# Patient Record
Sex: Female | Born: 1990 | Race: Black or African American | Hispanic: No | Marital: Single | Smoking: Current some day smoker
Health system: Southern US, Community
[De-identification: ages and names within clinical notes are randomized; demographics above are authoritative.]

## PROBLEM LIST (undated history)

## (undated) ENCOUNTER — Inpatient Hospital Stay (HOSPITAL_COMMUNITY): Payer: Self-pay

## (undated) DIAGNOSIS — I499 Cardiac arrhythmia, unspecified: Secondary | ICD-10-CM

## (undated) DIAGNOSIS — D573 Sickle-cell trait: Secondary | ICD-10-CM

## (undated) DIAGNOSIS — F32A Depression, unspecified: Secondary | ICD-10-CM

## (undated) DIAGNOSIS — F419 Anxiety disorder, unspecified: Secondary | ICD-10-CM

## (undated) DIAGNOSIS — F329 Major depressive disorder, single episode, unspecified: Secondary | ICD-10-CM

## (undated) HISTORY — PX: NO PAST SURGERIES: SHX2092

---
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
## 2009-02-05 ENCOUNTER — Emergency Department (HOSPITAL_COMMUNITY): Admission: EM | Admit: 2009-02-05 | Discharge: 2009-02-05 | Payer: Self-pay | Admitting: Emergency Medicine

## 2009-03-05 ENCOUNTER — Emergency Department (HOSPITAL_COMMUNITY): Admission: EM | Admit: 2009-03-05 | Discharge: 2009-03-05 | Payer: Self-pay | Admitting: Family Medicine

## 2009-05-20 ENCOUNTER — Emergency Department (HOSPITAL_COMMUNITY): Admission: EM | Admit: 2009-05-20 | Discharge: 2009-05-20 | Payer: Self-pay | Admitting: Emergency Medicine

## 2009-05-24 ENCOUNTER — Emergency Department (HOSPITAL_COMMUNITY): Admission: EM | Admit: 2009-05-24 | Discharge: 2009-05-24 | Payer: Self-pay | Admitting: Emergency Medicine

## 2010-06-27 ENCOUNTER — Emergency Department (HOSPITAL_COMMUNITY)
Admission: EM | Admit: 2010-06-27 | Discharge: 2010-06-27 | Payer: Self-pay | Source: Home / Self Care | Admitting: Family Medicine

## 2010-08-08 ENCOUNTER — Emergency Department (HOSPITAL_COMMUNITY)
Admission: EM | Admit: 2010-08-08 | Discharge: 2010-08-08 | Payer: Self-pay | Source: Home / Self Care | Admitting: Emergency Medicine

## 2010-09-26 LAB — POCT RAPID STREP A (OFFICE): Streptococcus, Group A Screen (Direct): NEGATIVE

## 2010-09-26 LAB — CBC
HCT: 37.6 % (ref 36.0–46.0)
Hemoglobin: 12.9 g/dL (ref 12.0–15.0)
MCH: 30.9 pg (ref 26.0–34.0)
MCHC: 34.3 g/dL (ref 30.0–36.0)
MCV: 90 fL (ref 78.0–100.0)
Platelets: 176 10*3/uL (ref 150–400)
RBC: 4.18 MIL/uL (ref 3.87–5.11)
RDW: 12.8 % (ref 11.5–15.5)
WBC: 11.4 10*3/uL — ABNORMAL HIGH (ref 4.0–10.5)

## 2010-09-26 LAB — DIFFERENTIAL
Lymphocytes Relative: 16 % (ref 12–46)
Lymphs Abs: 1.8 10*3/uL (ref 0.7–4.0)
Neutrophils Relative %: 75 % (ref 43–77)

## 2010-09-26 LAB — POCT INFECTIOUS MONO SCREEN: Mono Screen: NEGATIVE

## 2010-10-19 LAB — DIFFERENTIAL
Basophils Relative: 0 % (ref 0–1)
Eosinophils Absolute: 0 10*3/uL (ref 0.0–0.7)
Eosinophils Absolute: 0 10*3/uL (ref 0.0–0.7)
Lymphocytes Relative: 18 % (ref 12–46)
Lymphs Abs: 1.1 10*3/uL (ref 0.7–4.0)
Monocytes Relative: 11 % (ref 3–12)
Neutro Abs: 4.6 10*3/uL (ref 1.7–7.7)
Neutrophils Relative %: 70 % (ref 43–77)
Neutrophils Relative %: 70 % (ref 43–77)

## 2010-10-19 LAB — URINALYSIS, ROUTINE W REFLEX MICROSCOPIC
Glucose, UA: NEGATIVE mg/dL
Ketones, ur: 15 mg/dL — AB
Nitrite: POSITIVE — AB
Protein, ur: NEGATIVE mg/dL
Specific Gravity, Urine: 1.019 (ref 1.005–1.030)
Urobilinogen, UA: 1 mg/dL (ref 0.0–1.0)
pH: 6 (ref 5.0–8.0)

## 2010-10-19 LAB — POCT I-STAT, CHEM 8
Calcium, Ion: 1.2 mmol/L (ref 1.12–1.32)
Hemoglobin: 15 g/dL (ref 12.0–15.0)
Sodium: 138 mEq/L (ref 135–145)
TCO2: 26 mmol/L (ref 0–100)

## 2010-10-19 LAB — CBC
MCHC: 34.4 g/dL (ref 30.0–36.0)
MCV: 96.2 fL (ref 78.0–100.0)
MCV: 96.2 fL (ref 78.0–100.0)
Platelets: 172 10*3/uL (ref 150–400)
RBC: 4.37 MIL/uL (ref 3.87–5.11)
WBC: 6.5 10*3/uL (ref 4.0–10.5)

## 2010-10-19 LAB — PREGNANCY, URINE: Preg Test, Ur: NEGATIVE

## 2010-10-19 LAB — POCT PREGNANCY, URINE: Preg Test, Ur: NEGATIVE

## 2010-10-19 LAB — URINE MICROSCOPIC-ADD ON

## 2010-10-19 LAB — GC/CHLAMYDIA PROBE AMP, GENITAL
Chlamydia, DNA Probe: NEGATIVE
GC Probe Amp, Genital: NEGATIVE

## 2010-10-19 LAB — WET PREP, GENITAL
Trich, Wet Prep: NONE SEEN
Yeast Wet Prep HPF POC: NONE SEEN

## 2010-10-19 LAB — MONONUCLEOSIS SCREEN: Mono Screen: POSITIVE — AB

## 2010-10-22 LAB — POCT URINALYSIS DIP (DEVICE)
Bilirubin Urine: NEGATIVE
Glucose, UA: NEGATIVE mg/dL
Nitrite: POSITIVE — AB
Urobilinogen, UA: 0.2 mg/dL (ref 0.0–1.0)

## 2010-10-22 LAB — GC/CHLAMYDIA PROBE AMP, GENITAL: Chlamydia, DNA Probe: POSITIVE — AB

## 2010-10-22 LAB — WET PREP, GENITAL: WBC, Wet Prep HPF POC: NONE SEEN

## 2010-10-22 LAB — POCT PREGNANCY, URINE: Preg Test, Ur: NEGATIVE

## 2011-03-30 ENCOUNTER — Emergency Department (HOSPITAL_COMMUNITY)
Admission: EM | Admit: 2011-03-30 | Discharge: 2011-03-30 | Disposition: A | Discharge status: Home / Self Care | Payer: Self-pay | Attending: Emergency Medicine | Admitting: Emergency Medicine

## 2011-03-30 ENCOUNTER — Emergency Department (HOSPITAL_COMMUNITY): Payer: Self-pay

## 2011-03-30 DIAGNOSIS — R10812 Left upper quadrant abdominal tenderness: Secondary | ICD-10-CM | POA: Insufficient documentation

## 2011-03-30 DIAGNOSIS — R197 Diarrhea, unspecified: Secondary | ICD-10-CM | POA: Insufficient documentation

## 2011-03-30 DIAGNOSIS — R1032 Left lower quadrant pain: Secondary | ICD-10-CM | POA: Insufficient documentation

## 2011-03-30 DIAGNOSIS — D573 Sickle-cell trait: Secondary | ICD-10-CM | POA: Insufficient documentation

## 2011-03-30 DIAGNOSIS — R110 Nausea: Secondary | ICD-10-CM | POA: Insufficient documentation

## 2011-03-30 LAB — COMPREHENSIVE METABOLIC PANEL
AST: 17 U/L (ref 0–37)
Albumin: 4.4 g/dL (ref 3.5–5.2)
Alkaline Phosphatase: 58 U/L (ref 39–117)
BUN: 10 mg/dL (ref 6–23)
CO2: 24 mEq/L (ref 19–32)
Chloride: 101 mEq/L (ref 96–112)
Creatinine, Ser: 0.59 mg/dL (ref 0.50–1.10)
GFR calc non Af Amer: >60 mL/min (ref >60)
Potassium: 3.6 mEq/L (ref 3.5–5.1)
Total Bilirubin: 0.9 mg/dL (ref 0.3–1.2)

## 2011-03-30 LAB — CBC
HCT: 40.3 % (ref 36.0–46.0)
Hemoglobin: 14.4 g/dL (ref 12.0–15.0)
MCH: 31.9 pg (ref 26.0–34.0)
MCHC: 35.7 g/dL (ref 30.0–36.0)
MCV: 89.4 fL (ref 78.0–100.0)
RDW: 12.9 % (ref 11.5–15.5)

## 2011-03-30 LAB — DIFFERENTIAL
Eosinophils Relative: 2 % (ref 0–5)
Lymphs Abs: 2 10*3/uL (ref 0.7–4.0)
Monocytes Absolute: 0.6 10*3/uL (ref 0.1–1.0)
Monocytes Relative: 9 % (ref 3–12)
Neutro Abs: 4 10*3/uL (ref 1.7–7.7)

## 2011-03-30 LAB — URINALYSIS, ROUTINE W REFLEX MICROSCOPIC
Bilirubin Urine: NEGATIVE
Glucose, UA: NEGATIVE mg/dL
Hgb urine dipstick: NEGATIVE
Ketones, ur: NEGATIVE mg/dL
Protein, ur: NEGATIVE mg/dL
pH: 6 (ref 5.0–8.0)

## 2011-03-30 MED ORDER — IOHEXOL 300 MG/ML  SOLN
100.0000 mL | Freq: Once | INTRAMUSCULAR | Status: AC | PRN
  Administered 2011-03-30: 100 mL via INTRAVENOUS

## 2011-07-18 NOTE — L&D Delivery Note (Signed)
Delivery Note At 5:27 PM a viable female was delivered via Vaginal, Spontaneous Delivery (Presentation: Right Occiput Anterior).  APGAR: 5, 8; weight 6 lb 10 oz (3005 g).   Placenta status: Intact, Spontaneous.  Cord: 3 vessels with the following complications: None.  Cord pH: none  Anesthesia: Epidural  Episiotomy: None Lacerations: 2nd degree Suture Repair: 2.0 chromic Est. Blood Loss (mL): 350  Mom to postpartum.  Baby to nursery-stable.  Mackenzey Crownover A 06/01/2012, 6:22 PM

## 2011-11-09 ENCOUNTER — Encounter (HOSPITAL_COMMUNITY): Payer: Self-pay | Admitting: *Deleted

## 2011-11-09 ENCOUNTER — Inpatient Hospital Stay (HOSPITAL_COMMUNITY)
Admission: AD | Admit: 2011-11-09 | Discharge: 2011-11-09 | Disposition: A | Discharge status: Home / Self Care | Payer: Self-pay | Source: Ambulatory Visit | Attending: Obstetrics & Gynecology | Admitting: Obstetrics & Gynecology

## 2011-11-09 ENCOUNTER — Inpatient Hospital Stay (HOSPITAL_COMMUNITY): Payer: Self-pay

## 2011-11-09 DIAGNOSIS — Z3490 Encounter for supervision of normal pregnancy, unspecified, unspecified trimester: Secondary | ICD-10-CM

## 2011-11-09 DIAGNOSIS — R109 Unspecified abdominal pain: Secondary | ICD-10-CM | POA: Insufficient documentation

## 2011-11-09 DIAGNOSIS — Z1389 Encounter for screening for other disorder: Secondary | ICD-10-CM

## 2011-11-09 DIAGNOSIS — O99891 Other specified diseases and conditions complicating pregnancy: Secondary | ICD-10-CM | POA: Insufficient documentation

## 2011-11-09 HISTORY — DX: Sickle-cell trait: D57.3

## 2011-11-09 LAB — WET PREP, GENITAL
Clue Cells Wet Prep HPF POC: NONE SEEN
Trich, Wet Prep: NONE SEEN
Yeast Wet Prep HPF POC: NONE SEEN

## 2011-11-09 LAB — URINALYSIS, ROUTINE W REFLEX MICROSCOPIC
Bilirubin Urine: NEGATIVE
Glucose, UA: NEGATIVE mg/dL
Hgb urine dipstick: NEGATIVE
Ketones, ur: 15 mg/dL — AB
Leukocytes, UA: NEGATIVE
Protein, ur: NEGATIVE mg/dL
pH: 6 (ref 5.0–8.0)

## 2011-11-09 NOTE — MAU Provider Note (Signed)
Attestation of Attending Supervision of Advanced Practitioner: Evaluation and management procedures were performed by the [facility] Fellow/PA/CNM/NP under my supervision and collaboration. Chart reviewed, and agree with management and plan.  Jaynie Collins, M.D. 11/09/2011 3:25 PM

## 2011-11-09 NOTE — Discharge Instructions (Signed)
    ________________________________________     To schedule your Maternity Eligibility Appointment, please call [phone].  When you arrive for your appointment you must bring the following items or information listed below.  Your appointment will be rescheduled if you do not have these items or are 15 minutes late. If currently receiving Medicaid, you MUST bring: 1. Medicaid Card 2. Social Security Card 3. Picture ID 4. Proof of Pregnancy 5. Verification of current address if the address on Medicaid card is incorrect "postmarked mail" If not receiving Medicaid, you MUST bring: 1. Social Security Card 2. Picture ID 3. Birth Certificate (if available) Passport or *Green Card 4. Proof of Pregnancy 5. Verification of current address "postmarked mail" for each income presented. 6. Verification of insurance coverage, if any 7. Check stubs from each employer for the previous month (if unable to present check stub  for each week, we will accept check stub for the first and last week ill the same month.) If you can't locate check stubs, you must bring a letter from the employer(s) and it must have the following information on letterhead, typed, in English: o name of company o company telephone number o how long been with the company, if less than one month o how much person earns per hour o how many hours per week work o the gross pay the person earned for the previous month If you are 23 years old or less, you do not have to bring proof of income unless you work or live with the father of the baby and at that time we will need proof of income from you and/or the father of the baby. Green Card recipients are eligible for Medicaid for Pregnant Women (MPW)    

## 2011-11-09 NOTE — MAU Provider Note (Signed)
History     CSN: [id number]  Arrival date & time 11/09/11  1122   None     Chief Complaint  Patient presents with  . Abdominal Pain    HPI Jill Reed is a 21 y.o. female @ [redacted]w[redacted]d gestation who presents to MAU for pain in early pregnancy. She describes the pain as cramping. The pain comes and goes. She has some nausea.  She denies bleeding or other problems. She had her last pap smear a year ago and was normal. No history of STD's. The history was provided by the patient.  Past Medical History  Diagnosis Date  . Asthma   . Sickle cell trait     Past Surgical History  Procedure Date  . No past surgeries     History reviewed. No pertinent family history.  History  Substance Use Topics  . Smoking status: Current Some Day Smoker    Types: Cigarettes  . Smokeless tobacco: Not on file  . Alcohol Use: No    OB History    Grav Para Term Preterm Abortions TAB SAB Ect Mult Living   1               Review of Systems  Constitutional: Negative for fever, chills and appetite change.  HENT: Negative.   Eyes: Negative.   Respiratory: Negative.   Cardiovascular: Negative.   Gastrointestinal: Positive for nausea and abdominal pain.  Genitourinary: Positive for frequency. Negative for dysuria, urgency, vaginal bleeding and vaginal discharge.  Musculoskeletal: Positive for back pain.  Neurological: Negative for light-headedness and headaches.  Psychiatric/Behavioral: Negative for confusion. The patient is not nervous/anxious.     Allergies  Review of patient's allergies indicates no known allergies.  Home Medications  No current outpatient prescriptions on file.  BP 119/54  Pulse 74  Temp(Src) 97.9 F (36.6 C) (Oral)  Resp 16  SpO2 100%  LMP 09/15/2011  Physical Exam  Nursing note and vitals reviewed. Constitutional: She is oriented to person, place, and time. She appears well-developed and well-nourished. No distress.  HENT:  Head: Normocephalic.  Eyes: EOM  are normal.  Neck: Neck supple.  Cardiovascular: Normal rate.   Pulmonary/Chest: Effort normal.  Abdominal: Soft. There is no tenderness.  Genitourinary:       External genitalia without lesions. White discharge vaginal vault. Cervix closed, long, no CMT, no adnexal tenderness. Uterus slightly enlarged.   Musculoskeletal: Normal range of motion.  Neurological: She is alert and oriented to person, place, and time. No cranial nerve deficit.  Skin: Skin is warm and dry.  Psychiatric: She has a normal mood and affect. Her behavior is normal. Judgment and thought content normal.    Results for orders placed during the hospital encounter of 11/09/11 (from the past 24 hour(s))  URINALYSIS, ROUTINE W REFLEX MICROSCOPIC     Status: Abnormal   Collection Time   11/09/11 11:48 AM      Component Value Range   Color, Urine YELLOW  YELLOW    APPearance CLEAR  CLEAR    Specific Gravity, Urine 1.015  1.005 - 1.030    pH 6.0  5.0 - 8.0    Glucose, UA NEGATIVE  NEGATIVE (mg/dL)   Hgb urine dipstick NEGATIVE  NEGATIVE    Bilirubin Urine NEGATIVE  NEGATIVE    Ketones, ur 15 (*) NEGATIVE (mg/dL)   Protein, ur NEGATIVE  NEGATIVE (mg/dL)   Urobilinogen, UA 0.2  0.0 - 1.0 (mg/dL)   Nitrite NEGATIVE  NEGATIVE  Leukocytes, UA NEGATIVE  NEGATIVE   POCT PREGNANCY, URINE     Status: Abnormal   Collection Time   11/09/11 12:48 PM      Component Value Range   Preg Test, Ur POSITIVE (*) NEGATIVE   WET PREP, GENITAL     Status: Abnormal   Collection Time   11/09/11  1:35 PM      Component Value Range   Yeast Wet Prep HPF POC NONE SEEN  NONE SEEN    Trich, Wet Prep NONE SEEN  NONE SEEN    Clue Cells Wet Prep HPF POC NONE SEEN  NONE SEEN    WBC, Wet Prep HPF POC FEW (*) NONE SEEN    ED Course  Procedures  Ultrasound today shows a 6.4 week IUP with cardiac activity at 119. EDD 06/30/12. MDM  Assessment: Viable IUP @ 6.4 weeks  Plan:  Start prenatal care   Pregnancy verification letter   Return here as  needed.    I have reviewed this patient's vital signs, nurses notes, appropriate labs and imaging.

## 2011-11-09 NOTE — MAU Note (Signed)
Pt states this is first pregnancy, was told in [place] she had BV, used metrogel as directed, states discharge unchanged. Was having upper abdominal pain that subsided this am after food, pain is bilateral lower abdominal pain and feels like cramping intermittently. Denies bleeding, does note creamy white non odorous vaginal discharge.

## 2011-11-10 LAB — GC/CHLAMYDIA PROBE AMP, GENITAL: Chlamydia, DNA Probe: NEGATIVE

## 2011-12-22 ENCOUNTER — Inpatient Hospital Stay (HOSPITAL_COMMUNITY)
Admission: AD | Admit: 2011-12-22 | Discharge: 2011-12-22 | Disposition: A | Discharge status: Home / Self Care | Payer: Medicaid Other | Source: Ambulatory Visit | Attending: Obstetrics | Admitting: Obstetrics

## 2011-12-22 DIAGNOSIS — O210 Mild hyperemesis gravidarum: Secondary | ICD-10-CM | POA: Insufficient documentation

## 2011-12-22 DIAGNOSIS — R109 Unspecified abdominal pain: Secondary | ICD-10-CM | POA: Insufficient documentation

## 2011-12-22 LAB — URINALYSIS, ROUTINE W REFLEX MICROSCOPIC
Bilirubin Urine: NEGATIVE
Ketones, ur: NEGATIVE mg/dL
Leukocytes, UA: NEGATIVE
Nitrite: NEGATIVE
Protein, ur: NEGATIVE mg/dL
pH: 6 (ref 5.0–8.0)

## 2011-12-22 NOTE — MAU Note (Signed)
Called, not in lobby 

## 2011-12-22 NOTE — MAU Note (Signed)
Pt states," I've had pain across the top of my stomach for two weeks, and continuous vomiting; I keep liquids down part of the time."

## 2012-01-16 ENCOUNTER — Encounter (HOSPITAL_COMMUNITY): Payer: Self-pay | Admitting: *Deleted

## 2012-01-16 ENCOUNTER — Inpatient Hospital Stay (HOSPITAL_COMMUNITY)
Admission: AD | Admit: 2012-01-16 | Discharge: 2012-01-16 | Disposition: A | Discharge status: Home / Self Care | Payer: Medicaid Other | Source: Ambulatory Visit | Attending: Obstetrics | Admitting: Obstetrics

## 2012-01-16 DIAGNOSIS — B3731 Acute candidiasis of vulva and vagina: Secondary | ICD-10-CM | POA: Insufficient documentation

## 2012-01-16 DIAGNOSIS — B373 Candidiasis of vulva and vagina: Secondary | ICD-10-CM

## 2012-01-16 DIAGNOSIS — R109 Unspecified abdominal pain: Secondary | ICD-10-CM | POA: Insufficient documentation

## 2012-01-16 DIAGNOSIS — O2390 Unspecified genitourinary tract infection in pregnancy, unspecified trimester: Secondary | ICD-10-CM | POA: Insufficient documentation

## 2012-01-16 DIAGNOSIS — N949 Unspecified condition associated with female genital organs and menstrual cycle: Secondary | ICD-10-CM | POA: Insufficient documentation

## 2012-01-16 LAB — URINALYSIS, ROUTINE W REFLEX MICROSCOPIC
Bilirubin Urine: NEGATIVE
Ketones, ur: NEGATIVE mg/dL
Specific Gravity, Urine: 1.015 (ref 1.005–1.030)
Urobilinogen, UA: 0.2 mg/dL (ref 0.0–1.0)
pH: 7 (ref 5.0–8.0)

## 2012-01-16 LAB — WET PREP, GENITAL
Clue Cells Wet Prep HPF POC: NONE SEEN
Trich, Wet Prep: NONE SEEN

## 2012-01-16 LAB — URINE MICROSCOPIC-ADD ON

## 2012-01-16 MED ORDER — NYSTATIN-TRIAMCINOLONE 100000-0.1 UNIT/GM-% EX CREA: TOPICAL_CREAM | CUTANEOUS | Status: DC

## 2012-01-16 MED ORDER — FLUCONAZOLE 150 MG PO TABS: 150.0000 mg | ORAL_TABLET | Freq: Once | ORAL | Status: AC

## 2012-01-16 MED ORDER — FLUCONAZOLE 150 MG PO TABS
150.0000 mg | ORAL_TABLET | Freq: Once | ORAL | Status: AC
  Administered 2012-01-16: 150 mg via ORAL
  Filled 2012-01-16: qty 1

## 2012-01-16 NOTE — Progress Notes (Signed)
Pt complaining of soreness to  Touch.

## 2012-01-16 NOTE — MAU Provider Note (Signed)
History     CSN: [id number]  Arrival date and time: 01/16/12 1623   None     Chief Complaint  Patient presents with  . Abdominal Pain  . Vaginal Pain   HPI  Pt is [redacted]w[redacted]d with white chunk vaginal discharge that is irritated and sore.  Pt is also having suprapubic pain and is sore on the outside of her vagina.  Pt says she has some urgency with some leakage before she gets to the bathroom.  Her next appointment is 7/17 with Dr. Gaynell Face.  Past Medical History  Diagnosis Date  . Asthma   . Sickle cell trait     Past Surgical History  Procedure Date  . No past surgeries     No family history on file.  History  Substance Use Topics  . Smoking status: Current Some Day Smoker    Types: Cigarettes  . Smokeless tobacco: Not on file  . Alcohol Use: No    Allergies: No Known Allergies  Prescriptions prior to admission  Medication Sig Dispense Refill  . acetaminophen-codeine (TYLENOL #3) 300-30 MG per tablet Take 1 tablet by mouth every 4 (four) hours as needed. For pain      . albuterol (PROVENTIL HFA;VENTOLIN HFA) 108 (90 BASE) MCG/ACT inhaler Inhale 2 puffs into the lungs every 6 (six) hours as needed. For shortness of breath      . Prenatal Vit-Fe Fumarate-FA (PRENATAL MULTIVITAMIN) TABS Take 1 tablet by mouth daily.        ROS Physical Exam   Blood pressure 104/59, pulse 59, temperature 98.9 F (37.2 C), temperature source Oral, resp. rate 16, height 5' 1.5" (1.562 m), weight 132 lb (59.875 kg), last menstrual period 09/15/2011.  Physical Exam  Vitals reviewed. Constitutional: She is oriented to person, place, and time. She appears well-developed and well-nourished.  HENT:  Head: Normocephalic.  Eyes: Pupils are equal, round, and reactive to light.  Neck: Normal range of motion.  Cardiovascular: Normal rate.   Respiratory: Effort normal.  GI: Soft.       Lower abdominal pressure  Genitourinary: Vaginal discharge found.       Labia slightly reddened and  edematous; vaginal mucosa reddened; Mod amount of white chunky discharge with watery clear/white discharge; cervix clean nontender; uterus gravid [redacted]w[redacted]d.  FHT audible with doppler  Musculoskeletal: Normal range of motion.  Neurological: She is alert and oriented to person, place, and time.  Skin: Skin is warm and dry.  Psychiatric: She has a normal mood and affect.    MAU Course  Procedures Results for orders placed during the hospital encounter of 01/16/12 (from the past 24 hour(s))  URINALYSIS, ROUTINE W REFLEX MICROSCOPIC     Status: Abnormal   Collection Time   01/16/12  4:55 PM      Component Value Range   Color, Urine YELLOW  YELLOW   APPearance CLOUDY (*) CLEAR   Specific Gravity, Urine 1.015  1.005 - 1.030   pH 7.0  5.0 - 8.0   Glucose, UA NEGATIVE  NEGATIVE mg/dL   Hgb urine dipstick TRACE (*) NEGATIVE   Bilirubin Urine NEGATIVE  NEGATIVE   Ketones, ur NEGATIVE  NEGATIVE mg/dL   Protein, ur NEGATIVE  NEGATIVE mg/dL   Urobilinogen, UA 0.2  0.0 - 1.0 mg/dL   Nitrite NEGATIVE  NEGATIVE   Leukocytes, UA LARGE (*) NEGATIVE  URINE MICROSCOPIC-ADD ON     Status: Abnormal   Collection Time   01/16/12  4:55 PM  Component Value Range   Squamous Epithelial / LPF FEW (*) RARE   WBC, UA 11-20  <3 WBC/hpf   Bacteria, UA MANY (*) RARE   Urine-Other YEAST    WET PREP, GENITAL     Status: Abnormal   Collection Time   01/16/12  5:55 PM      Component Value Range   Yeast Wet Prep HPF POC MANY (*) NONE SEEN   Trich, Wet Prep NONE SEEN  NONE SEEN   Clue Cells Wet Prep HPF POC NONE SEEN  NONE SEEN   WBC, Wet Prep HPF POC TOO NUMEROUS TO COUNT (*) NONE SEEN  urine sent for culture Treated with on Diflucan 150mg  in MAU- will give a prescription for another GC/chlamydia-pending     Assessment and Plan  Yeast vaginitis- diflucan 150mg  in MAU and prescription for #2 Mycolog cream for external use F/u with Dr. Ellin Goodie 01/16/2012, 5:45 PM

## 2012-01-16 NOTE — MAU Note (Signed)
Pt states, " I've had throbbing pain in my lower pelvis and when I sit my vagina hurts. I have a thick chunky vaginal discharge also since Friday or Sat."

## 2012-01-17 LAB — GC/CHLAMYDIA PROBE AMP, GENITAL
Chlamydia, DNA Probe: NEGATIVE
GC Probe Amp, Genital: NEGATIVE

## 2012-01-18 LAB — URINE CULTURE: Colony Count: 40000

## 2012-01-30 LAB — OB RESULTS CONSOLE RPR: RPR: NONREACTIVE

## 2012-01-30 LAB — OB RESULTS CONSOLE HEPATITIS B SURFACE ANTIGEN: Hepatitis B Surface Ag: NEGATIVE

## 2012-02-05 ENCOUNTER — Other Ambulatory Visit (HOSPITAL_COMMUNITY): Payer: Self-pay | Admitting: Obstetrics

## 2012-02-05 DIAGNOSIS — O2690 Pregnancy related conditions, unspecified, unspecified trimester: Secondary | ICD-10-CM

## 2012-02-08 ENCOUNTER — Other Ambulatory Visit: Payer: Self-pay

## 2012-02-08 ENCOUNTER — Ambulatory Visit (HOSPITAL_COMMUNITY)
Admission: RE | Admit: 2012-02-08 | Discharge: 2012-02-08 | Disposition: A | Discharge status: Home / Self Care | Payer: Medicaid Other | Source: Ambulatory Visit | Attending: Obstetrics | Admitting: Obstetrics

## 2012-02-08 ENCOUNTER — Encounter (HOSPITAL_COMMUNITY): Payer: Self-pay

## 2012-02-08 DIAGNOSIS — O99330 Smoking (tobacco) complicating pregnancy, unspecified trimester: Secondary | ICD-10-CM | POA: Insufficient documentation

## 2012-02-08 DIAGNOSIS — Z1389 Encounter for screening for other disorder: Secondary | ICD-10-CM | POA: Insufficient documentation

## 2012-02-08 DIAGNOSIS — Z363 Encounter for antenatal screening for malformations: Secondary | ICD-10-CM | POA: Insufficient documentation

## 2012-02-08 DIAGNOSIS — O2690 Pregnancy related conditions, unspecified, unspecified trimester: Secondary | ICD-10-CM

## 2012-02-08 DIAGNOSIS — O358XX0 Maternal care for other (suspected) fetal abnormality and damage, not applicable or unspecified: Secondary | ICD-10-CM | POA: Insufficient documentation

## 2012-02-08 LAB — QUAD SCREEN FOR MFM

## 2012-05-06 ENCOUNTER — Inpatient Hospital Stay (HOSPITAL_COMMUNITY)
Admission: AD | Admit: 2012-05-06 | Discharge: 2012-05-06 | Disposition: A | Discharge status: Home / Self Care | Payer: Medicaid Other | Source: Ambulatory Visit | Attending: Obstetrics | Admitting: Obstetrics

## 2012-05-06 ENCOUNTER — Encounter (HOSPITAL_COMMUNITY): Payer: Self-pay

## 2012-05-06 DIAGNOSIS — M899 Disorder of bone, unspecified: Secondary | ICD-10-CM | POA: Insufficient documentation

## 2012-05-06 DIAGNOSIS — M259 Joint disorder, unspecified: Secondary | ICD-10-CM | POA: Insufficient documentation

## 2012-05-06 DIAGNOSIS — M5430 Sciatica, unspecified side: Secondary | ICD-10-CM | POA: Insufficient documentation

## 2012-05-06 LAB — WET PREP, GENITAL
Trich, Wet Prep: NONE SEEN
Yeast Wet Prep HPF POC: NONE SEEN

## 2012-05-06 LAB — URINALYSIS, ROUTINE W REFLEX MICROSCOPIC
Glucose, UA: NEGATIVE mg/dL
Leukocytes, UA: NEGATIVE
Nitrite: NEGATIVE
Specific Gravity, Urine: 1.015 (ref 1.005–1.030)
pH: 7 (ref 5.0–8.0)

## 2012-05-06 MED ORDER — CYCLOBENZAPRINE HCL 10 MG PO TABS: 10.0000 mg | ORAL_TABLET | Freq: Three times a day (TID) | ORAL | Status: DC | PRN

## 2012-05-06 MED ORDER — ACETAMINOPHEN 325 MG PO TABS
650.0000 mg | ORAL_TABLET | ORAL | Status: DC | PRN
  Administered 2012-05-06: 650 mg via ORAL
  Filled 2012-05-06: qty 2

## 2012-05-06 MED ORDER — CYCLOBENZAPRINE HCL 10 MG PO TABS
10.0000 mg | ORAL_TABLET | Freq: Once | ORAL | Status: AC
  Administered 2012-05-06: 10 mg via ORAL
  Filled 2012-05-06: qty 1

## 2012-05-06 NOTE — MAU Note (Signed)
Ice pack to back 

## 2012-05-06 NOTE — MAU Provider Note (Signed)
History     CSN: [id number]  Arrival date and time: 05/06/12 1015   First Provider Initiated Contact with Patient 05/06/12 1154      Chief Complaint  Patient presents with  . Back Pain   HPI  Jill Reed is a 21 y.o. G1P0 @ [redacted]w[redacted]d who presents today with lower back pain. She states that the pain started yesterday after a long day at the park for a cook-out. She was very active during that time and then when she came home she had sudden onset of back pain. She rates is a 9/10, she states it was "shooting down her leg".  Past Medical History  Diagnosis Date  . Asthma   . Sickle cell trait     Past Surgical History  Procedure Date  . No past surgeries     Family History  Problem Relation Age of Onset  . Sickle cell anemia Father     History  Substance Use Topics  . Smoking status: Former Smoker    Types: Cigarettes    Quit date: 10/17/2011  . Smokeless tobacco: Never Used  . Alcohol Use: No    Allergies:  Allergies  Allergen Reactions  . Carrot (Daucus Carota) Itching and Rash  . Mushroom Extract Complex Itching and Rash  . Peach (Prunus Persica) Itching and Rash    Prescriptions prior to admission  Medication Sig Dispense Refill  . acetaminophen-codeine (TYLENOL #3) 300-30 MG per tablet Take 1 tablet by mouth daily as needed. For pain      . Prenatal Vit-Fe Fumarate-FA (PRENATAL MULTIVITAMIN) TABS Take 1 tablet by mouth every morning.       Marland Kitchen albuterol (PROVENTIL HFA;VENTOLIN HFA) 108 (90 BASE) MCG/ACT inhaler Inhale 2 puffs into the lungs every 6 (six) hours as needed. For shortness of breath        Review of Systems  Constitutional: Negative for fever and chills.  Respiratory: Negative for cough.   Gastrointestinal: Negative for nausea, vomiting, abdominal pain, diarrhea and constipation.  Genitourinary: Negative for dysuria and urgency.  Musculoskeletal: Positive for back pain (see HPI). Negative for myalgias.  Neurological: Negative for  headaches.   Physical Exam   Blood pressure 126/64, pulse 86, temperature 98.3 F (36.8 C), temperature source Oral, resp. rate 16, height 5' 1.5" (1.562 m), weight 75.388 kg (166 lb 3.2 oz), last menstrual period 09/15/2011, SpO2 100.00%.  Physical Exam  Nursing note and vitals reviewed. Constitutional: She is oriented to person, place, and time. She appears well-developed and well-nourished.  HENT:  Head: Normocephalic.  Neck: Normal range of motion.  Cardiovascular: Normal rate and regular rhythm.   Respiratory: Effort normal and breath sounds normal.  GI: Soft.  Genitourinary:        No CVA tenderness External: normal Vagina: small amount of white discharge Cervix: pink, smooth. Closed/thick/high Uterus: AGA  Neurological: She is alert and oriented to person, place, and time. She has normal reflexes.    MAU Course  Procedures  1405: pt states pain has resolved after flexeril.   Results for orders placed during the hospital encounter of 05/06/12 (from the past 24 hour(s))  URINALYSIS, ROUTINE W REFLEX MICROSCOPIC     Status: Normal   Collection Time   05/06/12 10:53 AM      Component Value Range   Color, Urine YELLOW  YELLOW   APPearance CLEAR  CLEAR   Specific Gravity, Urine 1.015  1.005 - 1.030   pH 7.0  5.0 - 8.0   Glucose,  UA NEGATIVE  NEGATIVE mg/dL   Hgb urine dipstick NEGATIVE  NEGATIVE   Bilirubin Urine NEGATIVE  NEGATIVE   Ketones, ur NEGATIVE  NEGATIVE mg/dL   Protein, ur NEGATIVE  NEGATIVE mg/dL   Urobilinogen, UA 0.2  0.0 - 1.0 mg/dL   Nitrite NEGATIVE  NEGATIVE   Leukocytes, UA NEGATIVE  NEGATIVE  WET PREP, GENITAL     Status: Abnormal   Collection Time   05/06/12 12:06 PM      Component Value Range   Yeast Wet Prep HPF POC NONE SEEN  NONE SEEN   Trich, Wet Prep NONE SEEN  NONE SEEN   Clue Cells Wet Prep HPF POC NONE SEEN  NONE SEEN   WBC, Wet Prep HPF POC FEW (*) NONE SEEN    Assessment and Plan   1. Sciatica   comfort measures  reviewed 3rd trimester danger signs reviewed  Tawnya Crook 05/06/2012, 11:54 AM

## 2012-05-06 NOTE — MAU Note (Signed)
No adverse reaction to flexeril, pain starting to lessen.

## 2012-05-06 NOTE — MAU Note (Signed)
Patient states she started having left lower back last night that continues today. Patient denies any contractions, bleeding or leaking fluid. Reports good fetal movement.

## 2012-05-06 NOTE — MAU Note (Signed)
Pt states last night was having severe back pain, had to bend over [place] to walk, tailbone hurts, did do a lot of walking yesterday, denies uti s/s, denies bleeding or vag d/c changes.

## 2012-05-07 LAB — GC/CHLAMYDIA PROBE AMP, GENITAL: Chlamydia, DNA Probe: NEGATIVE

## 2012-05-21 ENCOUNTER — Inpatient Hospital Stay (HOSPITAL_COMMUNITY)
Admission: AD | Admit: 2012-05-21 | Discharge: 2012-05-21 | Disposition: A | Discharge status: Home / Self Care | Payer: Medicaid Other | Source: Ambulatory Visit | Attending: Obstetrics | Admitting: Obstetrics

## 2012-05-21 ENCOUNTER — Encounter (HOSPITAL_COMMUNITY): Payer: Self-pay | Admitting: Obstetrics and Gynecology

## 2012-05-21 DIAGNOSIS — O26899 Other specified pregnancy related conditions, unspecified trimester: Secondary | ICD-10-CM

## 2012-05-21 DIAGNOSIS — K529 Noninfective gastroenteritis and colitis, unspecified: Secondary | ICD-10-CM

## 2012-05-21 DIAGNOSIS — O99891 Other specified diseases and conditions complicating pregnancy: Secondary | ICD-10-CM | POA: Insufficient documentation

## 2012-05-21 DIAGNOSIS — R197 Diarrhea, unspecified: Secondary | ICD-10-CM | POA: Insufficient documentation

## 2012-05-21 DIAGNOSIS — R109 Unspecified abdominal pain: Secondary | ICD-10-CM | POA: Insufficient documentation

## 2012-05-21 DIAGNOSIS — K5289 Other specified noninfective gastroenteritis and colitis: Secondary | ICD-10-CM | POA: Insufficient documentation

## 2012-05-21 LAB — URINALYSIS, ROUTINE W REFLEX MICROSCOPIC
Bilirubin Urine: NEGATIVE
Glucose, UA: NEGATIVE mg/dL
Hgb urine dipstick: NEGATIVE
Ketones, ur: 15 mg/dL — AB
pH: 6.5 (ref 5.0–8.0)

## 2012-05-21 LAB — URINE MICROSCOPIC-ADD ON

## 2012-05-21 MED ORDER — ONDANSETRON 4 MG PO TBDP
4.0000 mg | ORAL_TABLET | Freq: Once | ORAL | Status: AC
  Administered 2012-05-21: 4 mg via ORAL
  Filled 2012-05-21: qty 1

## 2012-05-21 NOTE — MAU Note (Signed)
Pain started around 0900, brown watery diarrhea.  Increased swelling below the knees, feet hurt.

## 2012-05-21 NOTE — MAU Provider Note (Signed)
Chief Complaint:  Abdominal Pain, Leg Swelling and Diarrhea   First Provider Initiated Contact with Patient 05/21/12 1159      HPI: Jill Reed is a 21 y.o. G1P0000 at [redacted]w[redacted]d who presents to maternity admissions reporting Lower abdominal cramping, episode of diarrhea at  0900 today, increased lower extremity swelling and seeing stars. Feels like her abdomen states tight. Came in today because she had no relief after taking nifedipine and Flexeril this morning. Also nauseated at present. Denies dysuria, urgency, frequency, leakage of fluid or vaginal bleeding. Good fetal movement. No fever or chills  Pregnancy Course: Threatened PTL on nifedipine  Past Medical History: Past Medical History  Diagnosis Date  . Asthma   . Sickle cell trait     Past obstetric history: OB History    Grav Para Term Preterm Abortions TAB SAB Ect Mult Living   1 0 0 0 0 0 0 0 0 0      # Outc Date GA Lbr Len/2nd Wgt Sex Del Anes PTL Lv   1 CUR               Past Surgical History: Past Surgical History  Procedure Date  . No past surgeries     Family History: Family History  Problem Relation Age of Onset  . Sickle cell anemia Father     Social History: History  Substance Use Topics  . Smoking status: Former Smoker    Types: Cigarettes    Quit date: 10/17/2011  . Smokeless tobacco: Never Used  . Alcohol Use: No    Allergies:  Allergies  Allergen Reactions  . Carrot (Daucus Carota) Itching and Rash  . Mushroom Extract Complex Itching and Rash  . Peach (Prunus Persica) Itching and Rash    Meds:  Prescriptions prior to admission  Medication Sig Dispense Refill  . albuterol (PROVENTIL HFA;VENTOLIN HFA) 108 (90 BASE) MCG/ACT inhaler Inhale 2 puffs into the lungs every 6 (six) hours as needed. For shortness of breath      . cyclobenzaprine (FLEXERIL) 10 MG tablet Take 1 tablet (10 mg total) by mouth 3 (three) times daily as needed for muscle spasms.  10 tablet  0  . NIFEdipine  (PROCARDIA-XL/ADALAT CC) 60 MG 24 hr tablet Take 60 mg by mouth daily.      . Prenatal Vit-Fe Fumarate-FA (PRENATAL MULTIVITAMIN) TABS Take 1 tablet by mouth every morning.         ROS: Pertinent findings in history of present illness.  Physical Exam  Blood pressure 113/68, pulse 87, temperature 98.2 F (36.8 C), temperature source Oral, resp. rate 20, height 5' 1.5" (1.562 m), weight 83.008 kg (183 lb), last menstrual period 09/15/2011. GENERAL: Well-developed, well-nourished female in no acute distress.  HEENT: normocephalic HEART: normal rate RESP: normal effort ABDOMEN: Soft, non-tender, gravid appropriate for gestational age EXTREMITIES: Nontender, no edema NEURO: alert and oriented SPECULUM EXAM: NEFG, physiologic discharge, no blood, cervix clean Dilation: Closed Effacement (%): Thick Cervical Position: Posterior Station: -3 Presentation: Vertex Exam by:: D. Consetta Cosner CNM  FHT:  Baseline 130 , moderate variability, accelerations present, no decelerations Toco: Sl UI   Labs: Results for orders placed during the hospital encounter of 05/21/12 (from the past 24 hour(s))  URINALYSIS, ROUTINE W REFLEX MICROSCOPIC     Status: Abnormal   Collection Time   05/21/12 10:30 AM      Component Value Range   Color, Urine YELLOW  YELLOW   APPearance CLOUDY (*) CLEAR   Specific Gravity, Urine 1.020  1.005 - 1.030   pH 6.5  5.0 - 8.0   Glucose, UA NEGATIVE  NEGATIVE mg/dL   Hgb urine dipstick NEGATIVE  NEGATIVE   Bilirubin Urine NEGATIVE  NEGATIVE   Ketones, ur 15 (*) NEGATIVE mg/dL   Protein, ur NEGATIVE  NEGATIVE mg/dL   Urobilinogen, UA 0.2  0.0 - 1.0 mg/dL   Nitrite NEGATIVE  NEGATIVE   Leukocytes, UA SMALL (*) NEGATIVE  URINE MICROSCOPIC-ADD ON     Status: Abnormal   Collection Time   05/21/12 10:30 AM      Component Value Range   Squamous Epithelial / LPF MANY (*) RARE   WBC, UA 11-20  <3 WBC/hpf   Bacteria, UA MANY (*) RARE    MAU Course: Zofran given for nausea. No  vomiting or BM while here.  Assessment: 1. Abdominal pain complicating pregnancy   2. Gastroenteritis   G1 [redacted]w[redacted]d Cat 1 FHR  Plan: Discharge home and keep appt with Dr. Gaynell Face if still having pain. Otherwise call office to reschedule. Labor precautions and fetal kick counts    Medication List     As of 05/21/2012 12:02 PM    ASK your doctor about these medications         albuterol 108 (90 BASE) MCG/ACT inhaler   Commonly known as: PROVENTIL HFA;VENTOLIN HFA   Inhale 2 puffs into the lungs every 6 (six) hours as needed. For shortness of breath      cyclobenzaprine 10 MG tablet   Commonly known as: FLEXERIL   Take 1 tablet (10 mg total) by mouth 3 (three) times daily as needed for muscle spasms.      NIFEdipine 60 MG 24 hr tablet   Commonly known as: PROCARDIA-XL/ADALAT CC   Take 60 mg by mouth daily.      prenatal multivitamin Tabs   Take 1 tablet by mouth every morning.        Danae Orleans, CNM 05/21/2012 12:02 PM

## 2012-05-21 NOTE — MAU Note (Signed)
Reports wt gain, increased swelling, seeing stars.  Denies headache, denies epigastric pain.  abd pain - tightens up comes and goes.

## 2012-05-22 LAB — URINE CULTURE: Colony Count: NO GROWTH

## 2012-05-31 ENCOUNTER — Encounter (HOSPITAL_COMMUNITY): Payer: Self-pay

## 2012-05-31 ENCOUNTER — Inpatient Hospital Stay (HOSPITAL_COMMUNITY)
Admission: AD | Admit: 2012-05-31 | Discharge: 2012-06-06 | DRG: 775 | Disposition: A | Discharge status: Hospice | Payer: Medicaid Other | Source: Ambulatory Visit | Attending: Obstetrics | Admitting: Obstetrics

## 2012-05-31 DIAGNOSIS — O139 Gestational [pregnancy-induced] hypertension without significant proteinuria, unspecified trimester: Secondary | ICD-10-CM

## 2012-05-31 DIAGNOSIS — O99893 Other specified diseases and conditions complicating puerperium: Secondary | ICD-10-CM | POA: Diagnosis not present

## 2012-05-31 DIAGNOSIS — O1490 Unspecified pre-eclampsia, unspecified trimester: Secondary | ICD-10-CM

## 2012-05-31 DIAGNOSIS — R0609 Other forms of dyspnea: Secondary | ICD-10-CM | POA: Diagnosis not present

## 2012-05-31 DIAGNOSIS — R0989 Other specified symptoms and signs involving the circulatory and respiratory systems: Secondary | ICD-10-CM | POA: Diagnosis not present

## 2012-05-31 DIAGNOSIS — J811 Chronic pulmonary edema: Secondary | ICD-10-CM

## 2012-05-31 DIAGNOSIS — O9902 Anemia complicating childbirth: Secondary | ICD-10-CM | POA: Diagnosis present

## 2012-05-31 DIAGNOSIS — D573 Sickle-cell trait: Secondary | ICD-10-CM | POA: Diagnosis present

## 2012-05-31 LAB — CBC
HCT: 27.3 % — ABNORMAL LOW (ref 36.0–46.0)
MCH: 33.1 pg (ref 26.0–34.0)
MCV: 93.2 fL (ref 78.0–100.0)
RDW: 13.5 % (ref 11.5–15.5)
WBC: 15.5 10*3/uL — ABNORMAL HIGH (ref 4.0–10.5)

## 2012-05-31 LAB — COMPREHENSIVE METABOLIC PANEL
Albumin: 2.4 g/dL — ABNORMAL LOW (ref 3.5–5.2)
BUN: 10 mg/dL (ref 6–23)
Chloride: 100 mEq/L (ref 96–112)
Creatinine, Ser: 0.76 mg/dL (ref 0.50–1.10)
GFR calc non Af Amer: >90 mL/min (ref >90)
Total Bilirubin: 0.5 mg/dL (ref 0.3–1.2)

## 2012-05-31 LAB — URINALYSIS, ROUTINE W REFLEX MICROSCOPIC
Bilirubin Urine: NEGATIVE
Glucose, UA: NEGATIVE mg/dL
Ketones, ur: NEGATIVE mg/dL
Protein, ur: 100 mg/dL — AB

## 2012-05-31 LAB — URINE MICROSCOPIC-ADD ON

## 2012-05-31 MED ORDER — MORPHINE SULFATE 4 MG/ML IJ SOLN: 2.0000 mg | INTRAMUSCULAR | Status: DC | PRN

## 2012-05-31 MED ORDER — PROMETHAZINE HCL 25 MG/ML IJ SOLN
25.0000 mg | Freq: Four times a day (QID) | INTRAMUSCULAR | Status: DC | PRN
  Administered 2012-05-31 – 2012-06-01 (×2): 25 mg via INTRAMUSCULAR
  Filled 2012-05-31 (×2): qty 1

## 2012-05-31 MED ORDER — MORPHINE SULFATE 10 MG/ML IJ SOLN
10.0000 mg | INTRAMUSCULAR | Status: DC | PRN
  Administered 2012-05-31: 10 mg via INTRAMUSCULAR
  Filled 2012-05-31: qty 1

## 2012-05-31 MED ORDER — LACTATED RINGERS IV SOLN
INTRAVENOUS | Status: DC
  Administered 2012-05-31 – 2012-06-01 (×3): via INTRAVENOUS

## 2012-05-31 NOTE — MAU Note (Signed)
Patient is in with c/o irregular contractions that last for 2-3 minutes and nausea.she is  taking procardia for preterm contractions and was 1cm yesterday. She reports good fetal movement. She denies vaginal bleeding or lof.

## 2012-05-31 NOTE — MAU Provider Note (Signed)
History     CSN: [id number]  Arrival date and time: 05/31/12 2219   First Provider Initiated Contact with Patient 05/31/12 2254      Chief Complaint  Patient presents with  . Contractions   HPI This is a 21 y.o. female at [redacted]w[redacted]d who presents with c/o contractions that are painful. Also has new onset lower ext. Swelling. Denies headache. States was 1cm in office yesterday. Good FM.  OB History    Grav Para Term Preterm Abortions TAB SAB Ect Mult Living   1 0 0 0 0 0 0 0 0 0       Past Medical History  Diagnosis Date  . Asthma   . Sickle cell trait     Past Surgical History  Procedure Date  . No past surgeries     Family History  Problem Relation Age of Onset  . Sickle cell anemia Father     History  Substance Use Topics  . Smoking status: Former Smoker    Types: Cigarettes    Quit date: 10/17/2011  . Smokeless tobacco: Never Used  . Alcohol Use: No    Allergies:  Allergies  Allergen Reactions  . Carrot (Daucus Carota) Itching and Rash  . Mushroom Extract Complex Itching and Rash  . Peach (Prunus Persica) Itching and Rash    Prescriptions prior to admission  Medication Sig Dispense Refill  . NIFEdipine (PROCARDIA-XL/ADALAT CC) 60 MG 24 hr tablet Take 60 mg by mouth daily.      . Prenatal Vit-Fe Fumarate-FA (PRENATAL MULTIVITAMIN) TABS Take 1 tablet by mouth every morning.       Marland Kitchen albuterol (PROVENTIL HFA;VENTOLIN HFA) 108 (90 BASE) MCG/ACT inhaler Inhale 2 puffs into the lungs every 6 (six) hours as needed. For shortness of breath      . cyclobenzaprine (FLEXERIL) 10 MG tablet Take 1 tablet (10 mg total) by mouth 3 (three) times daily as needed for muscle spasms.  10 tablet  0    ROS See HPI  Physical Exam   Blood pressure 166/90, pulse 67, temperature 99.2 F (37.3 C), temperature source Oral, resp. rate 18, last menstrual period 09/15/2011.  Physical Exam  Constitutional: She is oriented to person, place, and time. She appears well-developed and  well-nourished. She appears distressed (with pain).  Cardiovascular: Normal rate.   Respiratory: Effort normal.  GI: Soft. She exhibits no distension and no mass. There is no tenderness. There is no rebound and no guarding.  Genitourinary: Uterus normal. Vaginal discharge found.       Dilation: 2.5 (2-3) Effacement (%): 90 Station: -1 Presentation: Vertex Exam by:: Kalii Chesmore cnm   Musculoskeletal: Normal range of motion. She exhibits edema (3+).  Neurological: She is alert and oriented to person, place, and time. She has normal reflexes. She displays normal reflexes.  Skin: Skin is warm and dry.  Psychiatric: She has a normal mood and affect.  FHR reassuring UCs every 1.5 minutes  MAU Course  Procedures  Results for orders placed during the hospital encounter of 05/31/12 (from the past 24 hour(s))  URINALYSIS, ROUTINE W REFLEX MICROSCOPIC     Status: Abnormal   Collection Time   05/31/12 10:30 PM      Component Value Range   Color, Urine YELLOW  YELLOW   APPearance CLEAR  CLEAR   Specific Gravity, Urine 1.025  1.005 - 1.030   pH 6.0  5.0 - 8.0   Glucose, UA NEGATIVE  NEGATIVE mg/dL   Hgb urine dipstick SMALL (*)  NEGATIVE   Bilirubin Urine NEGATIVE  NEGATIVE   Ketones, ur NEGATIVE  NEGATIVE mg/dL   Protein, ur 161 (*) NEGATIVE mg/dL   Urobilinogen, UA 0.2  0.0 - 1.0 mg/dL   Nitrite NEGATIVE  NEGATIVE   Leukocytes, UA NEGATIVE  NEGATIVE  URINE MICROSCOPIC-ADD ON     Status: Abnormal   Collection Time   05/31/12 10:30 PM      Component Value Range   Squamous Epithelial / LPF FEW (*) RARE   WBC, UA 0-2  <3 WBC/hpf   RBC / HPF 3-6  <3 RBC/hpf   Bacteria, UA FEW (*) RARE  CBC     Status: Abnormal   Collection Time   05/31/12 10:50 PM      Component Value Range   WBC 15.5 (*) 4.0 - 10.5 K/uL   RBC 2.93 (*) 3.87 - 5.11 MIL/uL   Hemoglobin 9.7 (*) 12.0 - 15.0 g/dL   HCT 09.6 (*) 04.5 - 40.9 %   MCV 93.2  78.0 - 100.0 fL   MCH 33.1  26.0 - 34.0 pg   MCHC 35.5  30.0  - 36.0 g/dL   RDW 81.1  91.4 - 78.2 %   Platelets 153  150 - 400 K/uL  COMPREHENSIVE METABOLIC PANEL     Status: Abnormal   Collection Time   05/31/12 10:50 PM      Component Value Range   Sodium 134 (*) 135 - 145 mEq/L   Potassium 3.6  3.5 - 5.1 mEq/L   Chloride 100  96 - 112 mEq/L   CO2 24  19 - 32 mEq/L   Glucose, Bld 91  70 - 99 mg/dL   BUN 10  6 - 23 mg/dL   Creatinine, Ser 9.56  0.50 - 1.10 mg/dL   Calcium 8.9  8.4 - 21.3 mg/dL   Total Protein 5.8 (*) 6.0 - 8.3 g/dL   Albumin 2.4 (*) 3.5 - 5.2 g/dL   AST 21  0 - 37 U/L   ALT 13  0 - 35 U/L   Alkaline Phosphatase 145 (*) 39 - 117 U/L   Total Bilirubin 0.5  0.3 - 1.2 mg/dL   GFR calc non Af Amer >90  >90 mL/min   GFR calc Af Amer >90  >90 mL/min    Assessment and Plan  A:  SIUP at [redacted]w[redacted]d       Preeclampsia, proteinuria      Probable labor  P:  Admit per Dr Clearance Coots      Magnesium Sulfate      Start 24 hr urine  [facility] 05/31/2012, 11:53 PM

## 2012-06-01 ENCOUNTER — Encounter (HOSPITAL_COMMUNITY): Payer: Self-pay | Admitting: Anesthesiology

## 2012-06-01 ENCOUNTER — Encounter (HOSPITAL_COMMUNITY): Payer: Self-pay | Admitting: *Deleted

## 2012-06-01 ENCOUNTER — Inpatient Hospital Stay (HOSPITAL_COMMUNITY): Payer: Medicaid Other | Admitting: Anesthesiology

## 2012-06-01 LAB — CBC
HCT: 31.3 % — ABNORMAL LOW (ref 36.0–46.0)
Hemoglobin: 10.8 g/dL — ABNORMAL LOW (ref 12.0–15.0)
MCH: 32.6 pg (ref 26.0–34.0)
MCV: 92.1 fL (ref 78.0–100.0)
Platelets: 147 10*3/uL — ABNORMAL LOW (ref 150–400)
RBC: 3.31 MIL/uL — ABNORMAL LOW (ref 3.87–5.11)
RBC: 3.4 MIL/uL — ABNORMAL LOW (ref 3.87–5.11)
RDW: 13.6 % (ref 11.5–15.5)
WBC: 12.9 10*3/uL — ABNORMAL HIGH (ref 4.0–10.5)
WBC: 21.6 10*3/uL — ABNORMAL HIGH (ref 4.0–10.5)

## 2012-06-01 LAB — RPR: RPR Ser Ql: NONREACTIVE

## 2012-06-01 LAB — PREPARE RBC (CROSSMATCH)

## 2012-06-01 LAB — ABO/RH: ABO/RH(D): A POS

## 2012-06-01 LAB — MRSA PCR SCREENING: MRSA by PCR: NEGATIVE

## 2012-06-01 MED ORDER — MAGNESIUM SULFATE 40 G IN LACTATED RINGERS - SIMPLE
2.0000 g/h | INTRAVENOUS | Status: DC
  Administered 2012-06-01: 2 g/h via INTRAVENOUS
  Filled 2012-06-01 (×2): qty 500

## 2012-06-01 MED ORDER — CITRIC ACID-SODIUM CITRATE 334-500 MG/5ML PO SOLN: 30.0000 mL | ORAL | Status: DC | PRN

## 2012-06-01 MED ORDER — DIPHENHYDRAMINE HCL 50 MG/ML IJ SOLN: 12.5000 mg | INTRAMUSCULAR | Status: DC | PRN

## 2012-06-01 MED ORDER — LACTATED RINGERS IV SOLN
500.0000 mL | Freq: Once | INTRAVENOUS | Status: AC
  Administered 2012-06-01: 08:00:00 via INTRAVENOUS

## 2012-06-01 MED ORDER — ONDANSETRON HCL 4 MG/2ML IJ SOLN: 4.0000 mg | Freq: Four times a day (QID) | INTRAMUSCULAR | Status: DC | PRN

## 2012-06-01 MED ORDER — LIDOCAINE HCL (PF) 1 % IJ SOLN
30.0000 mL | INTRAMUSCULAR | Status: AC | PRN
  Administered 2012-06-01: 30 mL via SUBCUTANEOUS
  Filled 2012-06-01: qty 30

## 2012-06-01 MED ORDER — LACTATED RINGERS IV SOLN: 500.0000 mL | INTRAVENOUS | Status: DC | PRN

## 2012-06-01 MED ORDER — FLEET ENEMA 7-19 GM/118ML RE ENEM: 1.0000 | ENEMA | RECTAL | Status: DC | PRN

## 2012-06-01 MED ORDER — DIBUCAINE 1 % RE OINT: 1.0000 "application " | TOPICAL_OINTMENT | RECTAL | Status: DC | PRN

## 2012-06-01 MED ORDER — SIMETHICONE 80 MG PO CHEW: 80.0000 mg | CHEWABLE_TABLET | ORAL | Status: DC | PRN

## 2012-06-01 MED ORDER — FENTANYL 2.5 MCG/ML BUPIVACAINE 1/10 % EPIDURAL INFUSION (WH - ANES)
14.0000 mL/h | INTRAMUSCULAR | Status: DC
  Administered 2012-06-01 (×2): 14 mL/h via EPIDURAL
  Filled 2012-06-01 (×2): qty 125

## 2012-06-01 MED ORDER — OXYTOCIN 40 UNITS IN LACTATED RINGERS INFUSION - SIMPLE MED
1.0000 m[IU]/min | INTRAVENOUS | Status: DC
  Filled 2012-06-01: qty 1000

## 2012-06-01 MED ORDER — MAGNESIUM SULFATE 40 G IN LACTATED RINGERS - SIMPLE: 2.0000 g/h | INTRAVENOUS | Status: DC

## 2012-06-01 MED ORDER — PRENATAL MULTIVITAMIN CH
1.0000 | ORAL_TABLET | Freq: Every day | ORAL | Status: DC
  Administered 2012-06-01 – 2012-06-06 (×6): 1 via ORAL
  Filled 2012-06-01 (×6): qty 1

## 2012-06-01 MED ORDER — BUPIVACAINE HCL (PF) 0.25 % IJ SOLN
INTRAMUSCULAR | Status: DC | PRN
  Administered 2012-06-01 (×2): 5 mL via EPIDURAL

## 2012-06-01 MED ORDER — NALBUPHINE SYRINGE 5 MG/0.5 ML
10.0000 mg | INJECTION | Freq: Four times a day (QID) | INTRAMUSCULAR | Status: DC | PRN
  Administered 2012-06-01: 10 mg via INTRAMUSCULAR
  Filled 2012-06-01: qty 1

## 2012-06-01 MED ORDER — MAGNESIUM SULFATE BOLUS VIA INFUSION
4.0000 g | Freq: Once | INTRAVENOUS | Status: AC
  Administered 2012-06-01: 4 g via INTRAVENOUS
  Filled 2012-06-01: qty 500

## 2012-06-01 MED ORDER — ONDANSETRON HCL 4 MG PO TABS: 4.0000 mg | ORAL_TABLET | ORAL | Status: DC | PRN

## 2012-06-01 MED ORDER — IBUPROFEN 600 MG PO TABS
600.0000 mg | ORAL_TABLET | Freq: Four times a day (QID) | ORAL | Status: DC | PRN
  Administered 2012-06-01: 600 mg via ORAL
  Filled 2012-06-01: qty 1

## 2012-06-01 MED ORDER — WITCH HAZEL-GLYCERIN EX PADS: 1.0000 "application " | MEDICATED_PAD | CUTANEOUS | Status: DC | PRN

## 2012-06-01 MED ORDER — OXYCODONE-ACETAMINOPHEN 5-325 MG PO TABS
1.0000 | ORAL_TABLET | ORAL | Status: DC | PRN
  Administered 2012-06-01: 2 via ORAL
  Filled 2012-06-01: qty 2

## 2012-06-01 MED ORDER — SENNOSIDES-DOCUSATE SODIUM 8.6-50 MG PO TABS
2.0000 | ORAL_TABLET | Freq: Every day | ORAL | Status: DC
  Administered 2012-06-01 – 2012-06-05 (×4): 2 via ORAL

## 2012-06-01 MED ORDER — PHENYLEPHRINE 40 MCG/ML (10ML) SYRINGE FOR IV PUSH (FOR BLOOD PRESSURE SUPPORT)
80.0000 ug | PREFILLED_SYRINGE | INTRAVENOUS | Status: DC | PRN
  Filled 2012-06-01: qty 5

## 2012-06-01 MED ORDER — DIPHENHYDRAMINE HCL 25 MG PO CAPS: 25.0000 mg | ORAL_CAPSULE | Freq: Four times a day (QID) | ORAL | Status: DC | PRN

## 2012-06-01 MED ORDER — TERBUTALINE SULFATE 1 MG/ML IJ SOLN: 0.2500 mg | Freq: Once | INTRAMUSCULAR | Status: DC | PRN

## 2012-06-01 MED ORDER — MAGNESIUM SULFATE BOLUS VIA INFUSION: 4.0000 g | Freq: Once | INTRAVENOUS | Status: DC

## 2012-06-01 MED ORDER — LACTATED RINGERS IV SOLN: INTRAVENOUS | Status: DC

## 2012-06-01 MED ORDER — IBUPROFEN 600 MG PO TABS
600.0000 mg | ORAL_TABLET | Freq: Four times a day (QID) | ORAL | Status: DC
  Administered 2012-06-01 – 2012-06-06 (×16): 600 mg via ORAL
  Filled 2012-06-01 (×17): qty 1

## 2012-06-01 MED ORDER — NALBUPHINE SYRINGE 5 MG/0.5 ML
10.0000 mg | INJECTION | INTRAMUSCULAR | Status: DC | PRN
  Administered 2012-06-01: 10 mg via INTRAVENOUS
  Filled 2012-06-01: qty 1

## 2012-06-01 MED ORDER — PHENYLEPHRINE 40 MCG/ML (10ML) SYRINGE FOR IV PUSH (FOR BLOOD PRESSURE SUPPORT): 80.0000 ug | PREFILLED_SYRINGE | INTRAVENOUS | Status: DC | PRN

## 2012-06-01 MED ORDER — ONDANSETRON HCL 4 MG/2ML IJ SOLN: 4.0000 mg | INTRAMUSCULAR | Status: DC | PRN

## 2012-06-01 MED ORDER — OXYTOCIN BOLUS FROM INFUSION: 500.0000 mL | INTRAVENOUS | Status: DC

## 2012-06-01 MED ORDER — MAGNESIUM SULFATE 40 G IN LACTATED RINGERS - SIMPLE
2.0000 g/h | INTRAVENOUS | Status: DC
  Administered 2012-06-01: 2 g/h via INTRAVENOUS
  Filled 2012-06-01: qty 500

## 2012-06-01 MED ORDER — BENZOCAINE-MENTHOL 20-0.5 % EX AERO
1.0000 "application " | INHALATION_SPRAY | CUTANEOUS | Status: DC | PRN
  Filled 2012-06-01 (×2): qty 56

## 2012-06-01 MED ORDER — OXYTOCIN 40 UNITS IN LACTATED RINGERS INFUSION - SIMPLE MED
62.5000 mL/h | INTRAVENOUS | Status: DC
  Administered 2012-06-01: 62.5 mL/h via INTRAVENOUS

## 2012-06-01 MED ORDER — ZOLPIDEM TARTRATE 5 MG PO TABS: 5.0000 mg | ORAL_TABLET | Freq: Every evening | ORAL | Status: DC | PRN

## 2012-06-01 MED ORDER — TETANUS-DIPHTH-ACELL PERTUSSIS 5-2.5-18.5 LF-MCG/0.5 IM SUSP
0.5000 mL | Freq: Once | INTRAMUSCULAR | Status: AC
  Administered 2012-06-02: 0.5 mL via INTRAMUSCULAR
  Filled 2012-06-01: qty 0.5

## 2012-06-01 MED ORDER — ACETAMINOPHEN 325 MG PO TABS: 650.0000 mg | ORAL_TABLET | ORAL | Status: DC | PRN

## 2012-06-01 MED ORDER — EPHEDRINE 5 MG/ML INJ: 10.0000 mg | INTRAVENOUS | Status: DC | PRN

## 2012-06-01 MED ORDER — LIDOCAINE HCL (PF) 1 % IJ SOLN
INTRAMUSCULAR | Status: DC | PRN
  Administered 2012-06-01 (×3): 4 mL

## 2012-06-01 MED ORDER — OXYCODONE-ACETAMINOPHEN 5-325 MG PO TABS
1.0000 | ORAL_TABLET | ORAL | Status: DC | PRN
  Administered 2012-06-01 – 2012-06-02 (×3): 1 via ORAL
  Administered 2012-06-02: 2 via ORAL
  Administered 2012-06-03 (×4): 1 via ORAL
  Administered 2012-06-03 – 2012-06-04 (×2): 2 via ORAL
  Administered 2012-06-04: 1 via ORAL
  Administered 2012-06-04 (×2): 2 via ORAL
  Administered 2012-06-05: 1 via ORAL
  Filled 2012-06-01 (×5): qty 1
  Filled 2012-06-01 (×2): qty 2
  Filled 2012-06-01: qty 1
  Filled 2012-06-01: qty 2
  Filled 2012-06-01: qty 1
  Filled 2012-06-01: qty 2
  Filled 2012-06-01 (×2): qty 1

## 2012-06-01 MED ORDER — EPHEDRINE 5 MG/ML INJ
10.0000 mg | INTRAVENOUS | Status: DC | PRN
  Filled 2012-06-01: qty 4

## 2012-06-01 MED ORDER — SODIUM CHLORIDE 0.9 % IV SOLN
2.0000 g | Freq: Four times a day (QID) | INTRAVENOUS | Status: DC
  Administered 2012-06-01 (×2): 2 g via INTRAVENOUS
  Filled 2012-06-01 (×4): qty 2000

## 2012-06-01 MED ORDER — LANOLIN HYDROUS EX OINT: TOPICAL_OINTMENT | CUTANEOUS | Status: DC | PRN

## 2012-06-01 MED ORDER — LACTATED RINGERS IV SOLN
INTRAVENOUS | Status: DC
  Administered 2012-06-01 – 2012-06-02 (×2): via INTRAVENOUS

## 2012-06-01 NOTE — Anesthesia Procedure Notes (Signed)
Epidural Patient location during procedure: OB Start time: 06/01/2012 7:55 AM  Staffing Performed by: anesthesiologist   Preanesthetic Checklist Completed: patient identified, site marked, surgical consent, pre-op evaluation, timeout performed, IV checked, risks and benefits discussed and monitors and equipment checked  Epidural Patient position: sitting Prep: site prepped and draped and DuraPrep Patient monitoring: continuous pulse ox and blood pressure Approach: right paramedian Injection technique: LOR air  Needle:  Needle type: Tuohy  Needle gauge: 17 G Needle length: 9 cm and 9 Needle insertion depth: 6 cm Catheter type: closed end flexible Catheter size: 19 Gauge Catheter at skin depth: 11 cm Test dose: negative  Assessment Events: blood not aspirated, injection not painful, no injection resistance, negative IV test and no paresthesia  Additional Notes Discussed risk of headache, infection, bleeding, nerve injury and failed or incomplete block.  Patient voices understanding and wishes to proceed. Reason for block:procedure for pain

## 2012-06-01 NOTE — Anesthesia Preprocedure Evaluation (Signed)
Anesthesia Evaluation  Patient identified by MRN, date of birth, ID band Patient awake    Reviewed: Allergy & Precautions, H&P , NPO status , Patient's Chart, lab work & pertinent test results, reviewed documented beta blocker date and time   History of Anesthesia Complications Negative for: history of anesthetic complications  Airway Mallampati: II TM Distance: >3 FB Neck ROM: full    Dental  (+) Teeth Intact   Pulmonary asthma (last udes inhaler 1.5 mos ago, uses twice a year) ,  breath sounds clear to auscultation        Cardiovascular hypertension (severe preeclampsia on magnesium), Rhythm:regular Rate:Normal     Neuro/Psych negative neurological ROS  negative psych ROS   GI/Hepatic negative GI ROS, Neg liver ROS,   Endo/Other  Morbid obesity  Renal/GU negative Renal ROS     Musculoskeletal   Abdominal   Peds  Hematology  (+) Sickle cell trait ,   Anesthesia Other Findings   Reproductive/Obstetrics (+) Pregnancy                           Anesthesia Physical Anesthesia Plan  ASA: III  Anesthesia Plan: Epidural   Post-op Pain Management:    Induction:   Airway Management Planned:   Additional Equipment:   Intra-op Plan:   Post-operative Plan:   Informed Consent: I have reviewed the patients History and Physical, chart, labs and discussed the procedure including the risks, benefits and alternatives for the proposed anesthesia with the patient or authorized representative who has indicated his/her understanding and acceptance.     Plan Discussed with:   Anesthesia Plan Comments:         Anesthesia Quick Evaluation

## 2012-06-01 NOTE — Progress Notes (Signed)
Dr. Clearance Coots was notified about pt's increased HR in the past hour 120-130s at rest. Pt's vital signs are stable BP 143/85, tem. 98.4, current HR 115. Pt is comfortable, not in pain and there is no excessive abnormal bleeding.No orders received , continue to monitor pt through the night and check am labs.

## 2012-06-01 NOTE — Progress Notes (Signed)
Jill Reed is a 21 y.o. G1P0000 at [redacted]w[redacted]d by LMP admitted for active labor  Subjective:   Objective: BP 158/98  Pulse 103  Temp 98.2 F (36.8 C) (Oral)  Resp 18  Ht 5' 1.5" (1.562 m)  Wt 92.08 kg (203 lb)  BMI 37.74 kg/m2  SpO2 100%  LMP 09/15/2011 I/O last 3 completed shifts: In: 1366.3 [P.O.:330; I.V.:1036.3] Out: 900 [Urine:900] Total I/O In: 1636.2 [P.O.:420; I.V.:1166.2; IV Piggyback:50] Out: 1050 [Urine:1050]  FHT:  FHR: 140-150 bpm, variability: moderate,  accelerations:  Present,  decelerations:  Absent UC:   regular, every 3-4 minutes SVE:   Dilation: 6 Effacement (%): 90 Station: +1 Exam by:: Rzhang,rnc0ob  Labs: Lab Results  Component Value Date   WBC 12.9* 06/01/2012   HGB 10.8* 06/01/2012   HCT 30.9* 06/01/2012   MCV 93.4 06/01/2012   PLT 147* 06/01/2012    Assessment / Plan: Spontaneous labor, progressing normally  Labor: Progressing normally Preeclampsia:  on magnesium sulfate Fetal Wellbeing:  Category I Pain Control:  Epidural I/D:  n/a Anticipated MOD:  NSVD  Jill Reed A 06/01/2012, 12:27 PM

## 2012-06-01 NOTE — Progress Notes (Signed)
Jill Reed is a 21 y.o. G1P0000 at [redacted]w[redacted]d by LMP admitted for active labor  Subjective:   Objective: BP 157/99  Pulse 112  Temp 98 F (36.7 C) (Oral)  Resp 18  Ht 5' 1.5" (1.562 m)  Wt 92.08 kg (203 lb)  BMI 37.74 kg/m2  SpO2 100%  LMP 09/15/2011 I/O last 3 completed shifts: In: 1366.3 [P.O.:330; I.V.:1036.3] Out: 900 [Urine:900] Total I/O In: 264.1 [I.V.:214.1; IV Piggyback:50] Out: 100 [Urine:100]  FHT:  FHR: 150-160 bpm, variability: moderate,  accelerations:  Present,  decelerations:  Absent UC:   regular, every 2-5 minutes SVE:   Dilation: 5 Effacement (%): 100 Station: +1 Exam by:: Rzhang,rnc-ob  Labs: Lab Results  Component Value Date   WBC 12.9* 06/01/2012   HGB 10.8* 06/01/2012   HCT 30.9* 06/01/2012   MCV 93.4 06/01/2012   PLT 147* 06/01/2012    Assessment / Plan: Spontaneous labor, progressing normally  Labor: Progressing normally Preeclampsia:  on magnesium sulfate Fetal Wellbeing:  Category I Pain Control:  Epidural I/D:  n/a Anticipated MOD:  NSVD  HARPER,CHARLES A 06/01/2012, 9:14 AM

## 2012-06-01 NOTE — H&P (Signed)
Jill Reed is a 21 y.o. female presenting for UC's. Maternal Medical History:  Reason for admission: Reason for admission: contractions.  21 yo G1  EDC 06-30-12.  Presents with UC's.  Contractions: Onset was 6-12 hours ago.   Frequency: regular.   Perceived severity is moderate.    Fetal activity: Perceived fetal activity is normal.   Last perceived fetal movement was within the past hour.    Prenatal complications: no prenatal complications Prenatal Complications - Diabetes: none.    OB History    Grav Para Term Preterm Abortions TAB SAB Ect Mult Living   1 0 0 0 0 0 0 0 0 0      Past Medical History  Diagnosis Date  . Asthma   . Sickle cell trait    Past Surgical History  Procedure Date  . No past surgeries    Family History: family history includes Sickle cell anemia in her father. Social History:  reports that she quit smoking about 7 months ago. Her smoking use included Cigarettes. She has never used smokeless tobacco. She reports that she does not drink alcohol or use illicit drugs.   Prenatal Transfer Tool  Maternal Diabetes: No Genetic Screening: Normal Maternal Ultrasounds/Referrals: Normal Fetal Ultrasounds or other Referrals:  None Maternal Substance Abuse:  No Significant Maternal Medications:  Meds include: Other:   PNV Significant Maternal Lab Results:  Lab values include: Other:   Unknown GBS status Other Comments:  None  Review of Systems  All other systems reviewed and are negative.    Dilation: 5 Effacement (%): 100 Station: +1 Exam by:: Rzhang,rnc-ob Blood pressure 157/99, pulse 112, temperature 98 F (36.7 C), temperature source Oral, resp. rate 18, height 5' 1.5" (1.562 m), weight 92.08 kg (203 lb), last menstrual period 09/15/2011, SpO2 100.00%. Maternal Exam:  Uterine Assessment: Contraction strength is moderate.  Contraction frequency is regular.   Abdomen: Patient reports no abdominal tenderness. Fetal presentation:  vertex  Introitus: Normal vulva. Normal vagina.  Pelvis: adequate for delivery.   Cervix: Cervix evaluated by digital exam.     Physical Exam  Nursing note and vitals reviewed. Constitutional: She is oriented to person, place, and time. She appears well-developed and well-nourished.  HENT:  Head: Normocephalic and atraumatic.  Eyes: Conjunctivae normal are normal. Pupils are equal, round, and reactive to light.  Neck: Normal range of motion. Neck supple.  Cardiovascular: Normal rate and regular rhythm.   Respiratory: Effort normal and breath sounds normal.  GI: Soft.  Genitourinary: Vagina normal and uterus normal.  Musculoskeletal: Normal range of motion.  Neurological: She is alert and oriented to person, place, and time.  Skin: Skin is warm and dry.  Psychiatric: She has a normal mood and affect. Her behavior is normal. Judgment and thought content normal.    Prenatal labs: ABO, Rh: --/--/A POS, A POS (11/16 0615) Antibody: NEG (11/16 0615) Rubella: Immune (07/16 0000) RPR: Nonreactive (07/16 0000)  HBsAg: Negative (07/16 0000)  HIV: Non-reactive (07/16 0000)  GBS:     Assessment/Plan: 35.6 weeks.  Early labor.  PIH.  Magnesium seizure prophylaxis started.  Expectant management.   Jill Reed A 06/01/2012, 9:06 AM

## 2012-06-01 NOTE — Plan of Care (Signed)
Problem: Consults Goal: Birthing Suites Patient Information Press F2 to bring up selections list  Outcome: Completed/Met Date Met:  06/01/12  Pt < [redacted] weeks EGA and PIH (Pregnancy induced hypertension)

## 2012-06-02 LAB — CBC
Hemoglobin: 8.6 g/dL — ABNORMAL LOW (ref 12.0–15.0)
Platelets: 147 10*3/uL — ABNORMAL LOW (ref 150–400)
RBC: 2.57 MIL/uL — ABNORMAL LOW (ref 3.87–5.11)
WBC: 16.1 10*3/uL — ABNORMAL HIGH (ref 4.0–10.5)

## 2012-06-02 MED ORDER — SODIUM CHLORIDE 0.9 % IV SOLN
250.0000 mL | INTRAVENOUS | Status: DC | PRN
  Administered 2012-06-04: 250 mL via INTRAVENOUS

## 2012-06-02 MED ORDER — PNEUMOCOCCAL VAC POLYVALENT 25 MCG/0.5ML IJ INJ
0.5000 mL | INJECTION | INTRAMUSCULAR | Status: AC
  Administered 2012-06-03: 0.5 mL via INTRAMUSCULAR
  Filled 2012-06-02: qty 0.5

## 2012-06-02 MED ORDER — SODIUM CHLORIDE 0.9 % IJ SOLN
3.0000 mL | Freq: Two times a day (BID) | INTRAMUSCULAR | Status: DC
  Administered 2012-06-02 – 2012-06-05 (×3): 3 mL via INTRAVENOUS

## 2012-06-02 MED ORDER — SODIUM CHLORIDE 0.9 % IJ SOLN
3.0000 mL | INTRAMUSCULAR | Status: DC | PRN
  Administered 2012-06-02: 3 mL via INTRAVENOUS

## 2012-06-02 MED ORDER — LABETALOL HCL 200 MG PO TABS
200.0000 mg | ORAL_TABLET | Freq: Three times a day (TID) | ORAL | Status: DC
  Administered 2012-06-02 – 2012-06-06 (×13): 200 mg via ORAL
  Filled 2012-06-02 (×13): qty 1

## 2012-06-02 NOTE — Anesthesia Postprocedure Evaluation (Signed)
  Anesthesia Post-op Note  Patient: Jill Reed  Procedure(s) Performed: * No procedures listed *  Patient Location: Women's Unit  Anesthesia Type:Epidural  Level of Consciousness: awake, alert  and oriented  Airway and Oxygen Therapy: Patient Spontanous Breathing  Post-op Pain: mild  Post-op Assessment: Patient's Cardiovascular Status Stable, Respiratory Function Stable, Patent Airway, No signs of Nausea or vomiting and Pain level controlled  Post-op Vital Signs: stable  Complications: No apparent anesthesia complications

## 2012-06-02 NOTE — Progress Notes (Signed)
Post Partum Day 1 Subjective: no complaints  Objective: Blood pressure 139/76, pulse 102, temperature 98 F (36.7 C), temperature source Oral, resp. rate 18, height 5\' 2"  (1.575 m), weight 88.905 kg (196 lb), last menstrual period 09/15/2011, SpO2 97.00%, unknown if currently breastfeeding.  Physical Exam:  General: alert and no distress Lochia: appropriate Uterine Fundus: firm Incision: none DVT Evaluation: No evidence of DVT seen on physical exam.   Basename 06/02/12 0501 06/01/12 1830  HGB 8.6* 11.1*  HCT 23.6* 31.3*    Assessment/Plan: Plan for discharge tomorrow.  Anemia.  Clinically stable.  Iron Rx.   LOS: 2 days   Geena Weinhold A 06/02/2012, 8:58 AM

## 2012-06-03 NOTE — Progress Notes (Signed)
Patient ID: Jill Reed, female   DOB: [birth year], 21 y.o.   MRN: [id number] Postpartum day 2 Blood pressure is normal fundus firm  Lochia moderate Legs negative She is on labetalol 200 3 times a day magnesium stopped last night transferred from the ICU today

## 2012-06-03 NOTE — Progress Notes (Addendum)
1610 Ebbie Ridge, RN on MBU to give report on pt for transfer to Rm# [id number] - RN to call back. [id number] SBar report to Courtney 1040 Pt transferred via w/c (pt request) to rm #[id number] Toni Amend, RN updated.

## 2012-06-04 ENCOUNTER — Other Ambulatory Visit: Payer: Self-pay

## 2012-06-04 ENCOUNTER — Inpatient Hospital Stay (HOSPITAL_COMMUNITY): Payer: Medicaid Other

## 2012-06-04 LAB — COMPREHENSIVE METABOLIC PANEL
ALT: 27 U/L (ref 0–35)
AST: 46 U/L — ABNORMAL HIGH (ref 0–37)
Albumin: 2.4 g/dL — ABNORMAL LOW (ref 3.5–5.2)
Albumin: 2.4 g/dL — ABNORMAL LOW (ref 3.5–5.2)
Alkaline Phosphatase: 111 U/L (ref 39–117)
BUN: 8 mg/dL (ref 6–23)
Calcium: 9 mg/dL (ref 8.4–10.5)
Chloride: 99 mEq/L (ref 96–112)
Creatinine, Ser: 0.79 mg/dL (ref 0.50–1.10)
GFR calc Af Amer: >90 mL/min (ref >90)
GFR calc non Af Amer: >90 mL/min (ref >90)
Glucose, Bld: 85 mg/dL (ref 70–99)
Glucose, Bld: 95 mg/dL (ref 70–99)
Potassium: 4.3 mEq/L (ref 3.5–5.1)
Sodium: 135 mEq/L (ref 135–145)
Total Bilirubin: 0.3 mg/dL (ref 0.3–1.2)
Total Protein: 5.8 g/dL — ABNORMAL LOW (ref 6.0–8.3)

## 2012-06-04 LAB — TROPONIN I
Troponin I: <0.3 ng/mL (ref <0.30)
Troponin I: <0.3 ng/mL (ref <0.30)

## 2012-06-04 LAB — MAGNESIUM: Magnesium: 1.6 mg/dL (ref 1.5–2.5)

## 2012-06-04 MED ORDER — FUROSEMIDE 40 MG PO TABS
40.0000 mg | ORAL_TABLET | Freq: Two times a day (BID) | ORAL | Status: AC
  Administered 2012-06-04 – 2012-06-05 (×3): 40 mg via ORAL
  Filled 2012-06-04 (×4): qty 1

## 2012-06-04 MED ORDER — FUROSEMIDE 10 MG/ML IJ SOLN
40.0000 mg | Freq: Once | INTRAMUSCULAR | Status: AC
  Administered 2012-06-04: 40 mg via INTRAVENOUS
  Filled 2012-06-04: qty 4

## 2012-06-04 MED ORDER — FUROSEMIDE 10 MG/ML IJ SOLN: 40.0000 mg | Freq: Once | INTRAMUSCULAR | Status: DC

## 2012-06-04 MED ORDER — IOHEXOL 350 MG/ML SOLN
100.0000 mL | Freq: Once | INTRAVENOUS | Status: AC | PRN
  Administered 2012-06-04: 100 mL via INTRAVENOUS

## 2012-06-04 NOTE — Progress Notes (Signed)
1320 CMet ordered d/t 6 liter diuresis since 0600 - lab called & asked if they could add it to the 11:00 lab draw. 1400 Checked on results & found that the CMet had instead been done on the 0700 lab draw which would not be a reflection of the K+ after diuresis - called lab to cancel/credit pt & redraw CMet now.

## 2012-06-04 NOTE — Progress Notes (Signed)
  Echocardiogram 2D Echocardiogram has been performed.  Jill Reed 06/04/2012, 1:21 PM

## 2012-06-04 NOTE — Progress Notes (Signed)
Patient ID: Jill Reed, female   DOB: Apr 28, 1991, 21 y.o.   MRN: [id number] Vital signs normal She has put out over 5000 cc of urine today her lungs are much better she has some still some fine rales but her oxygen since saturation in room and is 100% her potassium this afternoon was 4.6 she received a him a total of 18 of Lasix today IV and tomorrow morning is supposed to get another 4 PPO her echocardiogram was normal and we'll repeat the chest x-ray on Thursday a.m.

## 2012-06-04 NOTE — Progress Notes (Signed)
Patient ID: Jill Reed, female   DOB: [birth year], 21 y.o.   MRN: [id number] Chest x-ray showed pulmonary edema spiral CT no sign of a   embolus just the fluid her blood pressure 130/90 respirations of 24 from 4-2 pulse 96 she's  Put out 4200 cc of urine since receiving the 40 of Lasix  And she's more comfortable cardiologist consult was obtained

## 2012-06-04 NOTE — Plan of Care (Signed)
Problem: Problem: Cardiovascular Progression Goal: NO EVIDENCE OF VOLUME OVERLOAD Outcome: Progressing 11/19 pt developed pulmonary edema - treated w/lasix & O2

## 2012-06-04 NOTE — Consult Note (Signed)
Reason for Consult: Pulmonary edema Referring Physician:   [name] is an 21 y.o. female.   HPI:   The patient is a 21 yo female with a history of asthma and sickle cell trait.  She has no family history of CAD or arrhythmia.  She gave birth on 06/01/12 to a baby girl named Jill Reed.  She reports never having a problem with volume overload before.  She reported worsened dyspnea and orthopnea yesterday along with LEE, Abd soreness and palpitations which were particularly noticeable while at the CT scanner.  She was given IV lasix times two.  She denies N,V, Fever, CP, dizziness.    Past Medical History  Diagnosis Date  . Asthma   . Sickle cell trait     Past Surgical History  Procedure Date  . No past surgeries     Family History  Problem Relation Age of Onset  . Sickle cell anemia Father     Social History:  reports that she quit smoking about 7 months ago. Her smoking use included Cigarettes. She has never used smokeless tobacco. She reports that she does not drink alcohol or use illicit drugs.  Allergies:  Allergies  Allergen Reactions  . Carrot (Daucus Carota) Itching and Rash  . Mushroom Extract Complex Itching and Rash  . Peach (Prunus Persica) Itching and Rash    Medications:     . [COMPLETED] furosemide  40 mg Intravenous Once  . [COMPLETED] furosemide  40 mg Intravenous Once  . ibuprofen  600 mg Oral Q6H  . labetalol  200 mg Oral TID  . prenatal multivitamin  1 tablet Oral Daily  . senna-docusate  2 tablet Oral QHS  . sodium chloride  3 mL Intravenous Q12H  . [DISCONTINUED] furosemide  40 mg Intravenous Once     Results for orders placed during the hospital encounter of 05/31/12 (from the past 48 hour(s))  PRO B NATRIURETIC PEPTIDE     Status: Abnormal   Collection Time   06/04/12  7:00 AM      Component Value Range Comment   Pro B Natriuretic peptide (BNP) 1259.0 (*) 0 - 125 pg/mL   TROPONIN I     Status: Normal   Collection Time   06/04/12  7:00  AM      Component Value Range Comment   Troponin I <0.30  <0.30 ng/mL   COMPREHENSIVE METABOLIC PANEL     Status: Abnormal   Collection Time   06/04/12  7:00 AM      Component Value Range Comment   Sodium 135  135 - 145 mEq/L SPECIMEN REDRAWN,SEE ACC [id number]   Potassium 4.5  3.5 - 5.1 mEq/L    Chloride 99  96 - 112 mEq/L    CO2 30  19 - 32 mEq/L    Glucose, Bld 85  70 - 99 mg/dL    BUN 8  6 - 23 mg/dL    Creatinine, Ser 1.19  0.50 - 1.10 mg/dL    Calcium 8.9  8.4 - 14.7 mg/dL    Total Protein 5.5 (*) 6.0 - 8.3 g/dL    Albumin 2.4 (*) 3.5 - 5.2 g/dL    AST 36  0 - 37 U/L    ALT 22  0 - 35 U/L    Alkaline Phosphatase 116  39 - 117 U/L    Total Bilirubin 0.3  0.3 - 1.2 mg/dL    GFR calc non Af Amer >90  >90 mL/min    GFR  calc Af Amer >90  >90 mL/min   TROPONIN I     Status: Normal   Collection Time   06/04/12 10:55 AM      Component Value Range Comment   Troponin I <0.30  <0.30 ng/mL   COMPREHENSIVE METABOLIC PANEL     Status: Abnormal   Collection Time   06/04/12  2:15 PM      Component Value Range Comment   Sodium 135  135 - 145 mEq/L    Potassium 4.3  3.5 - 5.1 mEq/L    Chloride 98  96 - 112 mEq/L    CO2 31  19 - 32 mEq/L    Glucose, Bld 95  70 - 99 mg/dL    BUN 7  6 - 23 mg/dL    Creatinine, Ser 1.61  0.50 - 1.10 mg/dL    Calcium 9.0  8.4 - 09.6 mg/dL    Total Protein 5.8 (*) 6.0 - 8.3 g/dL    Albumin 2.4 (*) 3.5 - 5.2 g/dL    AST 46 (*) 0 - 37 U/L    ALT 27  0 - 35 U/L    Alkaline Phosphatase 111  39 - 117 U/L    Total Bilirubin 0.5  0.3 - 1.2 mg/dL    GFR calc non Af Amer >90  >90 mL/min    GFR calc Af Amer >90  >90 mL/min     Dg Chest 2 View  06/04/2012  *RADIOLOGY REPORT*  Clinical Data: Respiratory distress  CHEST - 2 VIEW  Comparison: 05/24/2009  Findings: Bilateral multifocal airspace opacities.  Heart size upper normal to mildly enlarged.  Small effusions.  No pneumothorax.  No acute osseous finding.  IMPRESSION: Multifocal airspace opacities; pulmonary  edema and/or multifocal pneumonia.   Original Report Authenticated By: Jearld Lesch, M.D.    Ct Angio Chest Pe W/cm &/or Wo Cm  06/04/2012  *RADIOLOGY REPORT*  Clinical Data: Respiratory distress  CT ANGIOGRAPHY CHEST  Technique:  Multidetector CT imaging of the chest using the standard protocol during bolus administration of intravenous contrast. Multiplanar reconstructed images including MIPs were obtained and reviewed to evaluate the vascular anatomy.  Contrast: OMNIPAQUE IOHEXOL 350 MG/ML SOLN  Comparison: Of 06/04/2012 radiograph  Findings: Pulmonary arterial branches are patent. Upper normal heart size.  No pericardial effusion.  Normal caliber aorta.  No intrathoracic lymphadenopathy.  Anterior mediastinal soft tissue may reflect edema or residual thymus.  Limited images through the upper abdomen show no acute finding.  Small to moderate bilateral pleural effusions.  Central airways are patent.  Multi focal patchy airspace opacities.  No pneumothorax.  No acute osseous finding.  IMPRESSION: No pulmonary embolism.  Multifocal airspace opacities may reflect pulmonary edema and/or multifocal pneumonia.  Small to moderate bilateral pleural effusions.   Original Report Authenticated By: Jearld Lesch, M.D.    2D Echo Study Conclusions  - Left ventricle: The cavity size was normal. There was hypertrophy, with an appearance suggesting concentric remodeling (increased wall thickness with normal wall mass). Systolic function was hyperdynamic. The estimated ejection fraction was in the range of 60% to 70%. Wall motion was normal; there were no regional wall motion abnormalities. Left ventricular diastolic function parameters were normal. There a false tendon or calcified papillary muscle noted. - Aortic valve: Mild regurgitation. - Left atrium: The atrium was moderately dilated. - Right atrium: The atrium was mildly to moderately dilated. Impressions:  - Normal study within technical  limitations. Occaisional ectopy was noted on this  study, making this study technically difficult for the assessment of cardiac function.    Review of Systems  Constitutional: Negative for fever and diaphoresis.  HENT: Negative for congestion and sore throat.   Respiratory: Positive for cough, shortness of breath and wheezing.   Cardiovascular: Positive for palpitations, orthopnea and leg swelling. Negative for chest pain.  Gastrointestinal: Positive for abdominal pain. Negative for nausea and vomiting.  Genitourinary: Negative for dysuria.  Neurological: Negative for dizziness.   Blood pressure 138/80, pulse 93, temperature 99 F (37.2 C), temperature source Oral, resp. rate 22, height 5\' 2"  (1.575 m), weight 84.913 kg (187 lb 3.2 oz), last menstrual period 09/15/2011, SpO2 96.00%. Physical Exam  Constitutional: She is oriented to person, place, and time. She appears well-developed and well-nourished. No distress.  HENT:  Head: Normocephalic and atraumatic.  Eyes: EOM are normal. Pupils are equal, round, and reactive to light. No scleral icterus.  Neck: Normal range of motion. Neck supple. JVD present.  Cardiovascular: Normal rate, S1 normal and S2 normal.  An irregular rhythm present.  No murmur heard. Pulses:      Radial pulses are 2+ on the right side, and 2+ on the left side.       Dorsalis pedis pulses are 2+ on the right side, and 2+ on the left side.       No Carotid Bruit.  Respiratory: Effort normal. She has no wheezes. She has rales.  GI: Soft. Bowel sounds are normal. There is tenderness.  Musculoskeletal: She exhibits edema.       2+ LEE  Lymphadenopathy:    She has no cervical adenopathy.  Neurological: She is alert and oriented to person, place, and time. She exhibits normal muscle tone.  Skin: Skin is warm and dry.  Psychiatric: She has a normal mood and affect.    Assessment/Plan: 1. Acute respiratory distress 2. Pulmonary Edema 3. Hx of Asthma 4. Sickle  cell trait.  Plan: The patient has diuresed ~5.4 Liters Since the 18th after receiving IV lasix.  No PE on CT angio.  Potassium is WNL.  We will continue to diurese with PO Lasix 40mg  Daily since she responded to the IV so well.   Check BMET and CBC in the AM.   WBC el Her EKG shows NSR with fairly regular premature junction beats.   I question whether this may have been arrhythmia induced.  We can set up 30 day heart monitor as an OP.  2D echo was normal.  EF 65-70%.  Cardiologist to follow.    HAGER, BRYAN 06/04/2012, 4:14 PM      Agree with note written by Jones Skene PAC  Asked to see pt with HTN and respiratory insufficiency/pulm edema. Young healthy female 3 days post partum with pre-eclampsia. Responded well to IV lasix with clinical improvement. CXR shows bilateral patchy infiltrates and 2D shows nl LV function with diastolic dysfunction. Tele with PVCs/PJCs. Labs OK. H/O sickle cell trait. Continue to treat with oral diuretics. On labetalol for HTN. May want to add CCB (norvasc) as well. We will continue to follow with you.  Runell Gess 06/04/2012 6:26 PM

## 2012-06-04 NOTE — Progress Notes (Signed)
Cardiology PA paged as instructed to ensure pt is rounded on today

## 2012-06-04 NOTE — Progress Notes (Signed)
Patient ID: Jill Reed, female   DOB: May 25, 1991, 21 y.o.   MRN: [id number] Patient developed pulmonary edema and a him her 31 fellow note on 10 of O2 a him sats up to 98 Foley catheter inserted 40 Lasix given to the patient is to be transferred back to the ICU for monitoring and chest x-ray and spiral CT and a cardiology consult

## 2012-06-04 NOTE — Progress Notes (Signed)
Called to patient's room at 0300. Patient c/o chest hurting and feels like asthma attack. O2 sat on room air 71%. O2 via simple mask applied at 10L. O2 sat up to 98%. Bilateral breathe sounds coarse and "wet." Respiratory rate 43/per minute and labored. House coverage called to assess patient and she concurred with assessment. Dr. Gaynell Face called with orders received. Will continue to closely monitor patient.

## 2012-06-04 NOTE — Progress Notes (Signed)
Loss of IV access - attempted restart x2, CRNA's unavailable, OB/RRT RN will come. Echo tech called to say she would be here around 11:30

## 2012-06-04 NOTE — Progress Notes (Signed)
UR chart review completed.  

## 2012-06-04 NOTE — Progress Notes (Signed)
Spoke w/Echo lab at [facility] tech scheduled to come here at 11:00 to do a portable 2D Echo

## 2012-06-04 NOTE — Progress Notes (Signed)
Called lab for CMet results - still processing

## 2012-06-04 NOTE — Progress Notes (Signed)
Patient transported to radiology via stretcher with O2, patient's RN, and CRNA at approximately 0500. RN and CRNA stayed with patient while in radiology and after scans completed, transported patient to AICU room 371 at 0550. Report given at bedside to [facility] RN.

## 2012-06-04 NOTE — Progress Notes (Signed)
Called lab for CMet results - lab complete but not resulting in Epic - Chris to call back with results

## 2012-06-05 LAB — COMPREHENSIVE METABOLIC PANEL
Albumin: 2.2 g/dL — ABNORMAL LOW (ref 3.5–5.2)
Alkaline Phosphatase: 102 U/L (ref 39–117)
BUN: 8 mg/dL (ref 6–23)
Creatinine, Ser: 0.82 mg/dL (ref 0.50–1.10)
GFR calc Af Amer: >90 mL/min (ref >90)
Glucose, Bld: 76 mg/dL (ref 70–99)
Total Bilirubin: 0.4 mg/dL (ref 0.3–1.2)
Total Protein: 5.4 g/dL — ABNORMAL LOW (ref 6.0–8.3)

## 2012-06-05 LAB — TYPE AND SCREEN: Unit division: 0

## 2012-06-05 LAB — CBC
HCT: 22 % — ABNORMAL LOW (ref 36.0–46.0)
Hemoglobin: 7.6 g/dL — ABNORMAL LOW (ref 12.0–15.0)
MCHC: 34.5 g/dL (ref 30.0–36.0)
MCV: 94.4 fL (ref 78.0–100.0)
RDW: 14 % (ref 11.5–15.5)

## 2012-06-05 NOTE — Progress Notes (Signed)
[facility]  Subjective: Breathing much better  Objective: Vital signs in last 24 hours: Temp:  [98 F (36.7 C)-99.1 F (37.3 C)] 99.1 F (37.3 C) (11/20 1555) Pulse Rate:  [80-103] 93  (11/20 1555) Resp:  [17-28] 24  (11/20 1400) BP: (111-156)/(62-99) 152/85 mmHg (11/20 1555) SpO2:  [95 %-100 %] 99 % (11/20 1555) Last BM Date: 06/03/12  Intake/Output from previous day: 11/19 0701 - 11/20 0700 In: 2282 [P.O.:2080; I.V.:202] Out: 4230 [Urine:4230] Intake/Output this shift: Total I/O In: 890 [P.O.:840; I.V.:50] Out: 3160 [Urine:3160]  Medications Current Facility-Administered Medications  Medication Dose Route Frequency Provider Last Rate Last Dose  . 0.9 %  sodium chloride infusion  250 mL Intravenous PRN Brock Bad, MD   250 mL at 06/05/12 0548  . benzocaine-Menthol (DERMOPLAST) 20-0.5 % topical spray 1 application  1 application Topical PRN Brock Bad, MD      . witch hazel-glycerin (TUCKS) pad 1 application  1 application Topical PRN Brock Bad, MD       And  . dibucaine (NUPERCAINAL) 1 % rectal ointment 1 application  1 application Rectal PRN Brock Bad, MD      . diphenhydrAMINE (BENADRYL) capsule 25 mg  25 mg Oral Q6H PRN Brock Bad, MD      . furosemide (LASIX) tablet 40 mg  40 mg Oral BID Wilburt Finlay, PA   40 mg at 06/05/12 0839  . ibuprofen (ADVIL,MOTRIN) tablet 600 mg  600 mg Oral Q6H Brock Bad, MD   600 mg at 06/05/12 1158  . labetalol (NORMODYNE) tablet 200 mg  200 mg Oral TID Brock Bad, MD   200 mg at 06/05/12 1559  . lanolin ointment   Topical PRN Brock Bad, MD      . ondansetron [facility]) tablet 4 mg  4 mg Oral Q4H PRN Brock Bad, MD       Or  . ondansetron [facility]) injection 4 mg  4 mg Intravenous Q4H PRN Brock Bad, MD      . oxyCODONE-acetaminophen (PERCOCET/ROXICET) 5-325 MG per tablet 1-2 tablet  1-2 tablet Oral Q3H PRN Brock Bad, MD   2 tablet at 06/04/12  2237  . prenatal multivitamin tablet 1 tablet  1 tablet Oral Daily Brock Bad, MD   1 tablet at 06/05/12 1009  . senna-docusate (Senokot-S) tablet 2 tablet  2 tablet Oral QHS Brock Bad, MD   2 tablet at 06/03/12 2138  . simethicone (MYLICON) chewable tablet 80 mg  80 mg Oral PRN Brock Bad, MD      . sodium chloride 0.9 % injection 3 mL  3 mL Intravenous Q12H Brock Bad, MD   3 mL at 06/05/12 1011  . sodium chloride 0.9 % injection 3 mL  3 mL Intravenous PRN Brock Bad, MD   3 mL at 06/02/12 1701  . zolpidem (AMBIEN) tablet 5 mg  5 mg Oral QHS PRN Brock Bad, MD        PE: General appearance: alert, cooperative and no distress Lungs: clear to auscultation bilaterally Heart: irregularly irregular rhythm Extremities: 1+ LEE Pulses: 2+ and symmetric Neurologic: Grossly normal  Lab Results:   Basename 06/05/12 0530  WBC 10.1  HGB 7.6*  HCT 22.0*  PLT 161   BMET  Basename 06/05/12 0530 06/04/12 1415 06/04/12 0700  NA 137 135 135  K 4.0 4.3 4.5  CL 99 98 99  CO2  29 31 30   GLUCOSE 76 95 85  BUN 8 7 8   CREATININE 0.82 0.83 0.79  CALCIUM 8.1* 9.0 8.9      Assessment/Plan  Active Problems:  * No active hospital problems. *  1. Acute respiratory distress  2. Pulmonary Edema  3. Hx of Asthma  4. Sickle cell trait.  Plan:  Net Fluids: -1948 in the last 24 hours.  ~-10L Since the 16th.  She was given 6.3 L on the 16th which likely precipitated the Pulmonary edema and acute resp distress.  She also reports a 6 pound gain between last Thursday and Friday last week prior to admission.   She continues to have frequent premature junctional beats.   Hgb has decreased from 8.6 to 7.6.   Continue to monitor.  BNP 1259.0.  Will continue PO lasix 40mg  tonight.  She can probably DC home tomorrow with PRN lasix 20mg .  Normal echo this admission.  Consider 30 day HR monitor.   LOS: 5 days    Champ Keetch 06/05/2012 4:15 PM

## 2012-06-05 NOTE — Progress Notes (Signed)
Pt. Seen and examined. Agree with the NP/PA-C note as written.  Doing very well, sitting with her new baby! Breathing has improved, net out over 10L. Echo shows diastolic dysfunction - ?volume related flash pulmonary edema. Agree with continuing diuretics and then can probably take as needed tomorrow for weight gain.   Chrystie Nose, MD, [facility] Attending Cardiologist [facility]

## 2012-06-05 NOTE — Progress Notes (Signed)
Patient ID: Jill Reed, female   DOB: 1991-04-12, 21 y.o.   MRN: [id number] Vital signs all normal still good Lungs are clear today fundus firm 1+ pedal edema she is beginning 40 of Lasix by mouth today potassium 4.3 patient doing very well

## 2012-06-05 NOTE — Progress Notes (Signed)
Patient ID: Jill Reed, female   DOB: [birth year], 21 y.o.   MRN: [id number]

## 2012-06-06 ENCOUNTER — Inpatient Hospital Stay (HOSPITAL_COMMUNITY): Payer: Medicaid Other

## 2012-06-06 NOTE — Progress Notes (Signed)
Patient ID: Jill Reed, female   DOB: August 20, 1990, 21 y.o.   MRN: [id number] And vital signs normal and Fundus firm Lochia moderate To plus edema Patient has been cleared for discharge by cardiology she'll be contacted by them for outpatient monitoring she'll be discharged today on the Betaloc 200 mg 3 times a day Percocet for pain Lasix 20 mg by mouth daily and Valisone cream 12 an abdominal rash she'll be seen in one week by me for reevaluation patient's doing well and

## 2012-06-06 NOTE — Discharge Summary (Signed)
Obstetric Discharge Summary Reason for Admission: onset of labor Prenatal Procedures: Preeclampsia Intrapartum Procedures: spontaneous vaginal delivery Postpartum Procedures: none Complications-Operative and Postpartum: none Hemoglobin  Date Value Range Status  06/05/2012 7.6* 12.0 - 15.0 g/dL Final     HCT  Date Value Range Status  06/05/2012 22.0* 36.0 - 46.0 % Final    Physical Exam:  General: alert Lochia: appropriate Uterine Fundus: firm Incision: healing well DVT Evaluation: No evidence of DVT seen on physical exam.  Discharge Diagnoses: Preelampsia  Discharge Information: Date: 06/06/2012 Activity: pelvic rest Diet: routine Medications: Percocet Condition: stable Instructions: refer to practice specific booklet Discharge to: home Follow-up Information    Schedule an appointment as soon as possible for a visit in 7 days to follow up.   Contact information:   b [address]         Newborn Data: Live born female  Birth Weight: 6 lb 10 oz (3005 g) APGAR: 5, 8  Home with mother.  MARSHALL,BERNARD A 06/06/2012, 8:50 AM

## 2012-06-06 NOTE — Progress Notes (Signed)
Patient ID: Jill Reed, female   DOB: Nov 17, 1990, 21 y.o.   MRN: [id number] 6vital signs normal Wants to go home Fundus firm And lochia moderate Lungs clear awaiting visit from the cardiologist clearance for discharge

## 2013-03-11 ENCOUNTER — Emergency Department (INDEPENDENT_AMBULATORY_CARE_PROVIDER_SITE_OTHER)
Admission: EM | Admit: 2013-03-11 | Discharge: 2013-03-11 | Disposition: A | Discharge status: Home / Self Care | Payer: Medicaid Other | Source: Home / Self Care

## 2013-03-11 ENCOUNTER — Encounter (HOSPITAL_COMMUNITY): Payer: Self-pay | Admitting: *Deleted

## 2013-03-11 ENCOUNTER — Other Ambulatory Visit (HOSPITAL_COMMUNITY)
Admission: RE | Admit: 2013-03-11 | Discharge: 2013-03-11 | Disposition: A | Discharge status: Home / Self Care | Payer: Medicaid Other | Source: Ambulatory Visit | Attending: Family Medicine | Admitting: Family Medicine

## 2013-03-11 DIAGNOSIS — N200 Calculus of kidney: Secondary | ICD-10-CM

## 2013-03-11 DIAGNOSIS — N898 Other specified noninflammatory disorders of vagina: Secondary | ICD-10-CM

## 2013-03-11 DIAGNOSIS — N760 Acute vaginitis: Secondary | ICD-10-CM | POA: Insufficient documentation

## 2013-03-11 DIAGNOSIS — Z113 Encounter for screening for infections with a predominantly sexual mode of transmission: Secondary | ICD-10-CM | POA: Insufficient documentation

## 2013-03-11 LAB — POCT URINALYSIS DIP (DEVICE)
Glucose, UA: NEGATIVE mg/dL
Hgb urine dipstick: NEGATIVE
Protein, ur: NEGATIVE mg/dL
Specific Gravity, Urine: 1.02 (ref 1.005–1.030)
Urobilinogen, UA: 0.2 mg/dL (ref 0.0–1.0)

## 2013-03-11 LAB — POCT I-STAT, CHEM 8
BUN: 6 mg/dL (ref 6–23)
Calcium, Ion: 1.23 mmol/L (ref 1.12–1.23)
Creatinine, Ser: 0.9 mg/dL (ref 0.50–1.10)
Glucose, Bld: 90 mg/dL (ref 70–99)
TCO2: 27 mmol/L (ref 0–100)

## 2013-03-11 LAB — CBC
HCT: 36.7 % (ref 36.0–46.0)
Hemoglobin: 13.5 g/dL (ref 12.0–15.0)
MCHC: 36.8 g/dL — ABNORMAL HIGH (ref 30.0–36.0)
RDW: 12.6 % (ref 11.5–15.5)
WBC: 6.9 10*3/uL (ref 4.0–10.5)

## 2013-03-11 LAB — POCT PREGNANCY, URINE: Preg Test, Ur: NEGATIVE

## 2013-03-11 MED ORDER — KETOROLAC TROMETHAMINE 60 MG/2ML IM SOLN
INTRAMUSCULAR | Status: AC
  Filled 2013-03-11: qty 2

## 2013-03-11 MED ORDER — METRONIDAZOLE 500 MG PO TABS: 500.0000 mg | ORAL_TABLET | Freq: Two times a day (BID) | ORAL | Status: DC

## 2013-03-11 MED ORDER — HYDROCODONE-ACETAMINOPHEN 5-325 MG PO TABS: 1.0000 | ORAL_TABLET | Freq: Four times a day (QID) | ORAL | Status: DC | PRN

## 2013-03-11 MED ORDER — KETOROLAC TROMETHAMINE 60 MG/2ML IM SOLN
60.0000 mg | Freq: Once | INTRAMUSCULAR | Status: AC
  Administered 2013-03-11: 60 mg via INTRAMUSCULAR

## 2013-03-11 MED ORDER — TAMSULOSIN HCL 0.4 MG PO CAPS: 0.4000 mg | ORAL_CAPSULE | Freq: Every day | ORAL | Status: DC

## 2013-03-11 NOTE — ED Notes (Signed)
Pt  Reports  r  Flank  Pain   With  Pain         Radiating  To  rlq         Since  Last  Week          Pain   Became  Worse  sev  Days  Ago         Pt  Has  Some  Nausea       As   Well      Pt  Reports a   Clear  Vaginal  Discharge

## 2013-03-11 NOTE — ED Provider Notes (Signed)
Jill Reed is a 22 y.o. female who presents to Urgent Care today for right flank pain for about one week radiating to the right groin. It is not worse with activity or motion or improved with rest. The pain has slowly worsened over the last week and has become quite intense. She denies any blood in her urine. She does note some whitish vaginal discharge that started about at the same time.  She denies any vaginal bleeding nausea vomiting diarrhea fevers or chills. She feels well otherwise.   PMH reviewed. 8 months postpartum. Not currently breast-feeding History  Substance Use Topics  . Smoking status: Former Smoker    Types: Cigarettes    Quit date: 10/17/2011  . Smokeless tobacco: Never Used  . Alcohol Use: No   ROS as above Medications reviewed. No current facility-administered medications for this encounter.   Current Outpatient Prescriptions  Medication Sig Dispense Refill  . albuterol (PROVENTIL HFA;VENTOLIN HFA) 108 (90 BASE) MCG/ACT inhaler Inhale 2 puffs into the lungs every 6 (six) hours as needed. For shortness of breath      . HYDROcodone-acetaminophen (NORCO/VICODIN) 5-325 MG per tablet Take 1 tablet by mouth every 6 (six) hours as needed for pain.  15 tablet  0  . metroNIDAZOLE (FLAGYL) 500 MG tablet Take 1 tablet (500 mg total) by mouth 2 (two) times daily.  14 tablet  0  . tamsulosin (FLOMAX) 0.4 MG CAPS capsule Take 1 capsule (0.4 mg total) by mouth daily.  30 capsule  0    Exam:  BP 122/78  Pulse 66  Temp(Src) 99.3 F (37.4 C) (Oral)  Resp 16  SpO2 100%  LMP 01/20/2013 Gen: Well NAD, uncomfortable-appearing HEENT: EOMI,  MMM Lungs: CTABL Nl WOB Heart: RRR no MRG Abd: NABS, NT, ND, no CV angle tenderness to percussion. No rebound or guarding Exts: Non edematous BL  LE, warm and well perfused.  GYN: normal external genitalia. Vaginal canal with white discharge. Normal-appearing cervix. No cervical motion tenderness adnexal masses. Normal uterus size and  position.  Results for orders placed during the hospital encounter of 03/11/13 (from the past 24 hour(s))  CBC     Status: Abnormal   Collection Time    03/11/13  3:20 PM      Result Value Range   WBC 6.9  4.0 - 10.5 K/uL   RBC 4.15  3.87 - 5.11 MIL/uL   Hemoglobin 13.5  12.0 - 15.0 g/dL   HCT 16.1  09.6 - 04.5 %   MCV 88.4  78.0 - 100.0 fL   MCH 32.5  26.0 - 34.0 pg   MCHC 36.8 (*) 30.0 - 36.0 g/dL   RDW 40.9  81.1 - 91.4 %   Platelets 225  150 - 400 K/uL  POCT URINALYSIS DIP (DEVICE)     Status: None   Collection Time    03/11/13  3:20 PM      Result Value Range   Glucose, UA NEGATIVE  NEGATIVE mg/dL   Bilirubin Urine NEGATIVE  NEGATIVE   Ketones, ur NEGATIVE  NEGATIVE mg/dL   Specific Gravity, Urine 1.020  1.005 - 1.030   Hgb urine dipstick NEGATIVE  NEGATIVE   pH 7.5  5.0 - 8.0   Protein, ur NEGATIVE  NEGATIVE mg/dL   Urobilinogen, UA 0.2  0.0 - 1.0 mg/dL   Nitrite NEGATIVE  NEGATIVE   Leukocytes, UA NEGATIVE  NEGATIVE  POCT PREGNANCY, URINE     Status: None   Collection Time  03/11/13  3:29 PM      Result Value Range   Preg Test, Ur NEGATIVE  NEGATIVE  POCT I-STAT, CHEM 8     Status: None   Collection Time    03/11/13  3:30 PM      Result Value Range   Sodium 142  135 - 145 mEq/L   Potassium 4.0  3.5 - 5.1 mEq/L   Chloride 104  96 - 112 mEq/L   BUN 6  6 - 23 mg/dL   Creatinine, Ser 1.61  0.50 - 1.10 mg/dL   Glucose, Bld 90  70 - 99 mg/dL   Calcium, Ion 0.96  0.45 - 1.23 mmol/L   TCO2 27  0 - 100 mmol/L   Hemoglobin 14.3  12.0 - 15.0 g/dL   HCT 40.9  81.1 - 91.4 %   No results found.  Assessment and Plan: 22 y.o. female with most likely kidney stone. Pyelonephritis is very doubtful given normal white blood cell count and normal appearing urinalysis. PID is equally unlikely given no cervical motion tenderness.  Plan to treat for kidney stone with Norco for pain medication, Flomax.  We'll treat vaginal discharge as presumptive bacterial vaginosis while  awaiting cytology for gonorrhea Chlamydia Trichomonas yeast and BV with Flagyl.   Patient will followup with her primary care provider for referral to urology.  Discussed warning signs or symptoms. Please see discharge instructions. Patient expresses understanding.      Rodolph Bong, MD 03/11/13 [phone]

## 2013-03-12 NOTE — ED Notes (Signed)
GC/Chlamydia pending, Affirm: Candida and Trich neg., Gardnerella pos.  Pt. adequately treated with Flagyl. Vassie Moselle 03/12/2013

## 2013-07-24 ENCOUNTER — Encounter (HOSPITAL_COMMUNITY): Payer: Self-pay | Admitting: Emergency Medicine

## 2013-07-24 ENCOUNTER — Emergency Department (HOSPITAL_COMMUNITY)
Admission: EM | Admit: 2013-07-24 | Discharge: 2013-07-24 | Disposition: A | Discharge status: Home / Self Care | Payer: No Typology Code available for payment source | Attending: Emergency Medicine | Admitting: Emergency Medicine

## 2013-07-24 DIAGNOSIS — Z87891 Personal history of nicotine dependence: Secondary | ICD-10-CM | POA: Diagnosis not present

## 2013-07-24 DIAGNOSIS — J45909 Unspecified asthma, uncomplicated: Secondary | ICD-10-CM | POA: Diagnosis not present

## 2013-07-24 DIAGNOSIS — Y9389 Activity, other specified: Secondary | ICD-10-CM | POA: Insufficient documentation

## 2013-07-24 DIAGNOSIS — D573 Sickle-cell trait: Secondary | ICD-10-CM | POA: Diagnosis not present

## 2013-07-24 DIAGNOSIS — M542 Cervicalgia: Secondary | ICD-10-CM | POA: Diagnosis present

## 2013-07-24 DIAGNOSIS — M62838 Other muscle spasm: Secondary | ICD-10-CM

## 2013-07-24 MED ORDER — TRAMADOL HCL 50 MG PO TABS
50.0000 mg | ORAL_TABLET | Freq: Four times a day (QID) | ORAL | Status: DC | PRN
Start: 2013-07-24 — End: 2013-11-04

## 2013-07-24 MED ORDER — IBUPROFEN 400 MG PO TABS
600.0000 mg | ORAL_TABLET | Freq: Once | ORAL | Status: AC
  Administered 2013-07-24: 600 mg via ORAL
  Filled 2013-07-24 (×2): qty 1

## 2013-07-24 MED ORDER — METHOCARBAMOL 500 MG PO TABS: 500.0000 mg | ORAL_TABLET | Freq: Two times a day (BID) | ORAL | Status: DC | PRN

## 2013-07-24 NOTE — Discharge Instructions (Signed)
Take the prescribed medication as directed. You will continue to be sore for the next few days-- may wish to apply heat to neck to help with muscle soreness/stiffness. Return to the ED for new or worsening symptoms.
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
## 2013-07-29 NOTE — ED Provider Notes (Signed)
Medical screening examination/treatment/procedure(s) were performed by non-physician practitioner and as supervising physician I was immediately available for consultation/collaboration.  Gilda Creasehristopher J. Pollina, MD 07/29/13 1059

## 2013-09-28 ENCOUNTER — Emergency Department (INDEPENDENT_AMBULATORY_CARE_PROVIDER_SITE_OTHER)
Admission: EM | Admit: 2013-09-28 | Discharge: 2013-09-28 | Disposition: A | Discharge status: Home / Self Care | Payer: Medicaid Other | Source: Home / Self Care | Attending: Family Medicine | Admitting: Family Medicine

## 2013-09-28 ENCOUNTER — Encounter (HOSPITAL_COMMUNITY): Payer: Self-pay | Admitting: Emergency Medicine

## 2013-09-28 DIAGNOSIS — R071 Chest pain on breathing: Secondary | ICD-10-CM

## 2013-09-28 DIAGNOSIS — R0789 Other chest pain: Secondary | ICD-10-CM

## 2013-09-28 MED ORDER — DICLOFENAC POTASSIUM 50 MG PO TABS: 50.0000 mg | ORAL_TABLET | Freq: Three times a day (TID) | ORAL | Status: DC

## 2013-09-28 NOTE — ED Provider Notes (Signed)
CSN: [id number]     Arrival date & time 09/28/13  1750 History   First MD Initiated Contact with Patient 09/28/13 1804     Chief Complaint  Patient presents with  . Chest Pain   (Consider location/radiation/quality/duration/timing/severity/associated sxs/prior Treatment) Patient is a 23 y.o. female presenting with chest pain. The history is provided by the patient.  Chest Pain Pain location:  L lateral chest Pain quality: sharp   Pain radiates to:  Upper back Pain radiates to the back: yes   Pain severity:  Mild Progression:  Unchanged Chronicity:  New Context: lifting, movement and raising an arm   Relieved by:  None tried Associated symptoms: no abdominal pain, no fever, no lower extremity edema, no nausea, no palpitations, no shortness of breath and not vomiting     Past Medical History  Diagnosis Date  . Asthma   . Sickle cell trait    Past Surgical History  Procedure Laterality Date  . No past surgeries     Family History  Problem Relation Age of Onset  . Sickle cell anemia Father    History  Substance Use Topics  . Smoking status: Former Smoker    Types: Cigarettes    Quit date: 10/17/2011  . Smokeless tobacco: Never Used  . Alcohol Use: No   OB History   Grav Para Term Preterm Abortions TAB SAB Ect Mult Living   1 1 0 1 0 0 0 0 0 1      Review of Systems  Constitutional: Negative for fever.  Respiratory: Negative.  Negative for shortness of breath.   Cardiovascular: Positive for chest pain. Negative for palpitations and leg swelling.  Gastrointestinal: Negative.  Negative for nausea, vomiting and abdominal pain.    Allergies  Carrot; Mushroom extract complex; and Peach  Home Medications   Current Outpatient Rx  Name  Route  Sig  Dispense  Refill  . albuterol (PROVENTIL HFA;VENTOLIN HFA) 108 (90 BASE) MCG/ACT inhaler   Inhalation   Inhale 2 puffs into the lungs every 6 (six) hours as needed. For shortness of breath         . diclofenac  (CATAFLAM) 50 MG tablet   Oral   Take 1 tablet (50 mg total) by mouth 3 (three) times daily.   30 tablet   0   . methocarbamol (ROBAXIN) 500 MG tablet   Oral   Take 1 tablet (500 mg total) by mouth 2 (two) times daily as needed for muscle spasms.   20 tablet   0   . traMADol (ULTRAM) 50 MG tablet   Oral   Take 1 tablet (50 mg total) by mouth every 6 (six) hours as needed.   15 tablet   0    BP 124/71  Pulse 106  Temp(Src) 99.5 F (37.5 C) (Oral)  Resp 20  SpO2 100%  LMP 09/14/2013 Physical Exam  Nursing note and vitals reviewed. Constitutional: She is oriented to person, place, and time. She appears well-developed and well-nourished.  HENT:  Mouth/Throat: Oropharynx is clear and moist.  Neck: Normal range of motion. Neck supple.  Cardiovascular: Normal rate and normal heart sounds.   Pulmonary/Chest: Effort normal and breath sounds normal. She exhibits tenderness.  Localizing cp reproducing sx of pain.  Lymphadenopathy:    She has no cervical adenopathy.  Neurological: She is alert and oriented to person, place, and time.  Skin: Skin is warm and dry.    ED Course  Procedures (including critical care time) Labs Review Labs  Reviewed - No data to display Imaging Review No results found. [id number].  MDM   1. Left-sided chest wall pain        Linna HoffJames D Kindl, MD 09/28/13 [phone]

## 2013-09-28 NOTE — ED Notes (Signed)
C/o chest pain States the pain is center of chest and radiates to the left side rib cage to the back Does have hx of acid reflux States it does hurt to breathe and cough Unable to use inhaler since she could not find it

## 2013-11-04 ENCOUNTER — Inpatient Hospital Stay (HOSPITAL_COMMUNITY)
Admission: AD | Admit: 2013-11-04 | Discharge: 2013-11-04 | Disposition: A | Discharge status: Home / Self Care | Payer: Medicaid Other | Source: Ambulatory Visit | Attending: Obstetrics & Gynecology | Admitting: Obstetrics & Gynecology

## 2013-11-04 ENCOUNTER — Encounter (HOSPITAL_COMMUNITY): Payer: Self-pay | Admitting: *Deleted

## 2013-11-04 ENCOUNTER — Inpatient Hospital Stay (HOSPITAL_COMMUNITY): Payer: Medicaid Other

## 2013-11-04 DIAGNOSIS — O2390 Unspecified genitourinary tract infection in pregnancy, unspecified trimester: Secondary | ICD-10-CM | POA: Insufficient documentation

## 2013-11-04 DIAGNOSIS — B9689 Other specified bacterial agents as the cause of diseases classified elsewhere: Secondary | ICD-10-CM | POA: Insufficient documentation

## 2013-11-04 DIAGNOSIS — Z3201 Encounter for pregnancy test, result positive: Secondary | ICD-10-CM

## 2013-11-04 DIAGNOSIS — R109 Unspecified abdominal pain: Secondary | ICD-10-CM | POA: Insufficient documentation

## 2013-11-04 DIAGNOSIS — N760 Acute vaginitis: Secondary | ICD-10-CM | POA: Insufficient documentation

## 2013-11-04 DIAGNOSIS — A499 Bacterial infection, unspecified: Secondary | ICD-10-CM | POA: Insufficient documentation

## 2013-11-04 DIAGNOSIS — D573 Sickle-cell trait: Secondary | ICD-10-CM | POA: Insufficient documentation

## 2013-11-04 DIAGNOSIS — N949 Unspecified condition associated with female genital organs and menstrual cycle: Secondary | ICD-10-CM | POA: Insufficient documentation

## 2013-11-04 DIAGNOSIS — Z87891 Personal history of nicotine dependence: Secondary | ICD-10-CM | POA: Insufficient documentation

## 2013-11-04 DIAGNOSIS — O99019 Anemia complicating pregnancy, unspecified trimester: Secondary | ICD-10-CM | POA: Insufficient documentation

## 2013-11-04 DIAGNOSIS — R112 Nausea with vomiting, unspecified: Secondary | ICD-10-CM

## 2013-11-04 DIAGNOSIS — I498 Other specified cardiac arrhythmias: Secondary | ICD-10-CM | POA: Insufficient documentation

## 2013-11-04 DIAGNOSIS — Z3490 Encounter for supervision of normal pregnancy, unspecified, unspecified trimester: Secondary | ICD-10-CM

## 2013-11-04 HISTORY — DX: Anxiety disorder, unspecified: F41.9

## 2013-11-04 HISTORY — DX: Depression, unspecified: F32.A

## 2013-11-04 HISTORY — DX: Cardiac arrhythmia, unspecified: I49.9

## 2013-11-04 HISTORY — DX: Major depressive disorder, single episode, unspecified: F32.9

## 2013-11-04 LAB — URINALYSIS, ROUTINE W REFLEX MICROSCOPIC
Bilirubin Urine: NEGATIVE
Glucose, UA: NEGATIVE mg/dL
Hgb urine dipstick: NEGATIVE
Ketones, ur: >80 mg/dL — AB
LEUKOCYTES UA: NEGATIVE
NITRITE: NEGATIVE
PROTEIN: NEGATIVE mg/dL
SPECIFIC GRAVITY, URINE: 1.015 (ref 1.005–1.030)
UROBILINOGEN UA: 0.2 mg/dL (ref 0.0–1.0)
pH: 6.5 (ref 5.0–8.0)

## 2013-11-04 LAB — CBC
HCT: 38.4 % (ref 36.0–46.0)
HEMOGLOBIN: 14 g/dL (ref 12.0–15.0)
MCH: 32.5 pg (ref 26.0–34.0)
MCHC: 36.5 g/dL — ABNORMAL HIGH (ref 30.0–36.0)
MCV: 89.1 fL (ref 78.0–100.0)
Platelets: 232 10*3/uL (ref 150–400)
RBC: 4.31 MIL/uL (ref 3.87–5.11)
RDW: 13.1 % (ref 11.5–15.5)
WBC: 12 10*3/uL — ABNORMAL HIGH (ref 4.0–10.5)

## 2013-11-04 LAB — WET PREP, GENITAL
Trich, Wet Prep: NONE SEEN
Yeast Wet Prep HPF POC: NONE SEEN

## 2013-11-04 LAB — HCG, QUANTITATIVE, PREGNANCY: hCG, Beta Chain, Quant, S: 35629 m[IU]/mL — ABNORMAL HIGH (ref <5)

## 2013-11-04 LAB — POCT PREGNANCY, URINE: Preg Test, Ur: POSITIVE — AB

## 2013-11-04 MED ORDER — METRONIDAZOLE 500 MG PO TABS: 500.0000 mg | ORAL_TABLET | Freq: Two times a day (BID) | ORAL | Status: DC

## 2013-11-04 MED ORDER — LACTATED RINGERS IV BOLUS (SEPSIS)
1000.0000 mL | Freq: Once | INTRAVENOUS | Status: AC
  Administered 2013-11-04: 1000 mL via INTRAVENOUS

## 2013-11-04 MED ORDER — PROMETHAZINE HCL 25 MG/ML IJ SOLN
12.5000 mg | Freq: Four times a day (QID) | INTRAMUSCULAR | Status: DC | PRN
  Administered 2013-11-04: 12.5 mg via INTRAVENOUS
  Filled 2013-11-04: qty 1

## 2013-11-04 MED ORDER — PROMETHAZINE HCL 25 MG PO TABS: 12.5000 mg | ORAL_TABLET | Freq: Four times a day (QID) | ORAL | Status: DC | PRN

## 2013-11-04 MED ORDER — DEXTROSE 5 % IN LACTATED RINGERS IV BOLUS
1000.0000 mL | Freq: Once | INTRAVENOUS | Status: AC
  Administered 2013-11-04: 1000 mL via INTRAVENOUS

## 2013-11-04 NOTE — Progress Notes (Signed)
Blanche EastJ. Rasch NP in earlier to discuss d/c instructions. Written and verbal d/c instructions given and understanding voiced.

## 2013-11-04 NOTE — MAU Note (Signed)
Pt had not tried ginger ale or crackers yet. Will try now and see how tolerated.

## 2013-11-04 NOTE — MAU Note (Signed)
Patient states she has had a positive home pregnancy test. States she has had abdominal cramping and vomiting since 4-18 but the vomiting has gotten worse and is unable to keep anything down. Has a heavier than usual discharge. No bleeding.

## 2013-11-04 NOTE — MAU Provider Note (Signed)
History     CSN: [id number]  Arrival date and time: 11/04/13 1100   None     Chief Complaint  Patient presents with  . Possible Pregnancy  . Emesis  . Abdominal Pain  . Vaginal Discharge   HPI  [name] is a 23 y.o. G1P0101 presenting for a 5 day history of nausea and vomiting. The patient reports that she has been unable to hold anything down including liquids and solids. On Saturday, the patient reports that she started having intermittent abdominal cramping and pelvic pain. She has not taken any OTC meds for relief of N/V or abdominal/pelvic pain. She did take a HPT which was positive. Today, she presents for her N/V, pelvic pain and to confirm pregnancy. Of note, she admits an "uncomfortable amount of vaginal discharge". It is white and thick, causing her to change underwear 3x per day. The d/c is odorless per the patient. She denies vaginal bleeding. She also admits urinary urgency but not dysuria. She has dizziness upon standing. No fevers and ROS is as below. Of note, her last LMP was the 09/14/13 and was regular. She has had 1 partner since then.  OB History   Grav Para Term Preterm Abortions TAB SAB Ect Mult Living   2 1 0 1 0 0 0 0 0 1       Past Medical History  Diagnosis Date  . Asthma   . Sickle cell trait   . Irregular heart beat   . Anxiety   . Depression     Past Surgical History  Procedure Laterality Date  . No past surgeries      Family History  Problem Relation Age of Onset  . Sickle cell anemia Father     History  Substance Use Topics  . Smoking status: Former Smoker    Types: Cigarettes    Quit date: 10/17/2011  . Smokeless tobacco: Never Used  . Alcohol Use: No    Allergies:  Allergies  Allergen Reactions  . Carrot [Daucus Carota] Itching and Rash  . Mushroom Extract Complex Itching and Rash  . Peach [Prunus Persica] Itching and Rash    Prescriptions prior to admission  Medication Sig Dispense Refill  . diclofenac (CATAFLAM) 50  MG tablet Take 1 tablet (50 mg total) by mouth 3 (three) times daily.  30 tablet  0  . traMADol (ULTRAM) 50 MG tablet Take 1 tablet (50 mg total) by mouth every 6 (six) hours as needed.  15 tablet  0  . albuterol (PROVENTIL HFA;VENTOLIN HFA) 108 (90 BASE) MCG/ACT inhaler Inhale 2 puffs into the lungs every 6 (six) hours as needed. For shortness of breath        Review of Systems  Constitutional: Negative for fever and chills.  HENT: Negative for hearing loss and sore throat.   Eyes: Negative for blurred vision.  Respiratory: Negative for cough and shortness of breath.   Cardiovascular: Negative for chest pain and leg swelling.  Gastrointestinal: Positive for nausea, vomiting (x5 days) and abdominal pain (abdominal cramping and pelvic pain x3 days). Negative for heartburn, diarrhea, constipation and blood in stool.  Genitourinary: Negative for dysuria, urgency, frequency, hematuria and flank pain.       Admits copious white vaginal discharge.  Musculoskeletal: Negative for joint pain.  Skin: Negative for rash.  Neurological: Positive for dizziness (intermittent dizziness when she stands). Negative for headaches.  Endo/Heme/Allergies: Does not bruise/bleed easily.    Physical Exam   Blood pressure 118/66, pulse 63, temperature  99.2 F (37.3 C), temperature source Oral, resp. rate 16, height 5\' 2"  (1.575 m), weight 67.132 kg (148 lb), last menstrual period 09/14/2013, SpO2 99.00%.  Physical Exam  Constitutional: She is oriented to person, place, and time. She appears well-developed and well-nourished. No distress.  HENT:  Head: Normocephalic and atraumatic.  Mouth/Throat: Oropharynx is clear and moist.  Eyes: EOM are normal. Pupils are equal, round, and reactive to light. No scleral icterus.  Neck: Normal range of motion. No tracheal deviation present.  Cardiovascular: Normal rate, regular rhythm, normal heart sounds and intact distal pulses.  Exam reveals no gallop and no friction rub.    No murmur heard. Respiratory: Effort normal and breath sounds normal. No stridor. No respiratory distress. She has no wheezes.  GI: Soft. Bowel sounds are normal. She exhibits no distension. There is no tenderness.  Genitourinary: There is no rash, tenderness or lesion on the right labia. There is no rash, tenderness or lesion on the left labia. Cervix exhibits motion tenderness (mild-moderate) and discharge (copious thick white discharge with NO bleeding. OS is closed). Cervix exhibits no friability. Right adnexum displays no mass, no tenderness and no fullness. Left adnexum displays no mass, no tenderness and no fullness. No erythema, tenderness or bleeding around the vagina. No foreign body around the vagina. No signs of injury around the vagina. Vaginal discharge (white, thick discharge externally) found.  Musculoskeletal: Normal range of motion. She exhibits no edema.  Neurological: She is alert and oriented to person, place, and time.  Skin: Skin is warm and dry. She is not diaphoretic.  Psychiatric: She has a normal mood and affect.    MAU Course  Procedures  Results for orders placed during the hospital encounter of 11/04/13 (from the past 24 hour(s))  URINALYSIS, ROUTINE W REFLEX MICROSCOPIC     Status: Abnormal   Collection Time    11/04/13 11:20 AM      Result Value Ref Range   Color, Urine YELLOW  YELLOW   APPearance CLEAR  CLEAR   Specific Gravity, Urine 1.015  1.005 - 1.030   pH 6.5  5.0 - 8.0   Glucose, UA NEGATIVE  NEGATIVE mg/dL   Hgb urine dipstick NEGATIVE  NEGATIVE   Bilirubin Urine NEGATIVE  NEGATIVE   Ketones, ur >80 (*) NEGATIVE mg/dL   Protein, ur NEGATIVE  NEGATIVE mg/dL   Urobilinogen, UA 0.2  0.0 - 1.0 mg/dL   Nitrite NEGATIVE  NEGATIVE   Leukocytes, UA NEGATIVE  NEGATIVE  POCT PREGNANCY, URINE     Status: Abnormal   Collection Time    11/04/13 12:02 PM      Result Value Ref Range   Preg Test, Ur POSITIVE (*) NEGATIVE  WET PREP, GENITAL     Status:  Abnormal   Collection Time    11/04/13 12:30 PM      Result Value Ref Range   Yeast Wet Prep HPF POC NONE SEEN  NONE SEEN   Trich, Wet Prep NONE SEEN  NONE SEEN   Clue Cells Wet Prep HPF POC FEW (*) NONE SEEN   WBC, Wet Prep HPF POC FEW (*) NONE SEEN  CBC     Status: Abnormal   Collection Time    11/04/13  1:10 PM      Result Value Ref Range   WBC 12.0 (*) 4.0 - 10.5 K/uL   RBC 4.31  3.87 - 5.11 MIL/uL   Hemoglobin 14.0  12.0 - 15.0 g/dL   HCT 65.738.4  84.636.0 -  46.0 %   MCV 89.1  78.0 - 100.0 fL   MCH 32.5  26.0 - 34.0 pg   MCHC 36.5 (*) 30.0 - 36.0 g/dL   RDW 40.9  81.1 - 91.4 %   Platelets 232  150 - 400 K/uL  HCG, QUANTITATIVE, PREGNANCY     Status: Abnormal   Collection Time    11/04/13  1:10 PM      Result Value Ref Range   hCG, Beta Chain, Quant, S 78295 (*) <5 mIU/mL   US Ob Comp Less 14 Wks  11/04/2013   CLINICAL DATA:  Abdominal pain and cramping.  LMP 09/14/2013.  EXAM: OBSTETRIC <14 WK [place] AND TRANSVAGINAL OB US  TECHNIQUE: Both transabdominal and transvaginal ultrasound examinations were performed for complete evaluation of the gestation as well as the maternal uterus, adnexal regions, and pelvic cul-de-sac. Transvaginal technique was performed to assess early pregnancy.  COMPARISON:  None.  FINDINGS: Intrauterine gestational sac: Visualized/normal in shape.  Yolk sac:  Present.  Embryo:  Present.  Cardiac Activity: Present.  Heart Rate:  120 bpm  CRL:   5.4  mm   6 w 3 d                  [place] EDC: 06/27/2014  Maternal uterus/adnexae: Unremarkable.  IMPRESSION: Single viable intrauterine gestation. Gestational age [redacted] weeks 2 days by LMP.   Electronically Signed   By: Sebastian Ache   On: 11/04/2013 14:12   US Ob Transvaginal  11/04/2013   CLINICAL DATA:  Abdominal pain and cramping.  LMP 09/14/2013.  EXAM: OBSTETRIC <14 WK [place] AND TRANSVAGINAL OB US  TECHNIQUE: Both transabdominal and transvaginal ultrasound examinations were performed for complete evaluation of the gestation as well  as the maternal uterus, adnexal regions, and pelvic cul-de-sac. Transvaginal technique was performed to assess early pregnancy.  COMPARISON:  None.  FINDINGS: Intrauterine gestational sac: Visualized/normal in shape.  Yolk sac:  Present.  Embryo:  Present.  Cardiac Activity: Present.  Heart Rate:  120 bpm  CRL:   5.4  mm   6 w 3 d                  [place] EDC: 06/27/2014  Maternal uterus/adnexae: Unremarkable.  IMPRESSION: Single viable intrauterine gestation. Gestational age [redacted] weeks 2 days by LMP.   Electronically Signed   By: Sebastian Ache   On: 11/04/2013 14:12   MDM Patient presents for early pregnancy. Admits abdominal and pelvic pain x 3 days. Must rule out ectopic pregnancy. Also complains of thick vaginal discharge; consider STI (gonorrhea, chlamydia, trichomonas) or BV.  CBC, UA, GC Chlamydia, Wet mount, [place] < 14 weeks, Beta HCG - Fluids for rehydration - IV Promethazine 12.5mg  x 1   Assessment and Plan   A: Patient is pregnant and [redacted]w[redacted]d by LMP.     IUP with cardiac activity on [place]      Bacterial vaginosis   P:  Discharge home in stable condition Return to MAU as needed, if symptoms worsen Unisom and B6 recommended: dose provided  Advised to pick up OTC prenatal vitamins and establish care at the [facility]  RX: Phenergan        Flagyl   Wallis Bamberg 11/04/2013, 2:23 PM    Evaluation and management procedures were performed by the PA student under my supervision and collaboration. I have reviewed the note and chart, and I agree with the management and plan.  Iona Hansen Frederika Hukill, NP 11/04/2013 4:14 PM

## 2013-11-04 NOTE — Progress Notes (Signed)
Pt has not vomited since admission to [facility]

## 2013-11-04 NOTE — MAU Note (Signed)
Due to system upgrade UPT did not cross over to results. UPT today in MAU positive.

## 2013-11-05 LAB — GC/CHLAMYDIA PROBE AMP
CT Probe RNA: NEGATIVE
GC PROBE AMP APTIMA: NEGATIVE

## 2013-11-06 NOTE — MAU Provider Note (Signed)
Attestation of Attending Supervision of Advanced Practitioner (PA/CNM/NP): Evaluation and management procedures were performed by the Advanced Practitioner under my supervision and collaboration.  I have reviewed the Advanced Practitioner's note and chart, and I agree with the management and plan.  Niylah Hassan, MD, FACOG Attending Obstetrician & Gynecologist Faculty Practice, [facility]  

## 2013-12-04 ENCOUNTER — Encounter (HOSPITAL_COMMUNITY): Payer: Self-pay | Admitting: Emergency Medicine

## 2013-12-04 ENCOUNTER — Emergency Department (INDEPENDENT_AMBULATORY_CARE_PROVIDER_SITE_OTHER)
Admission: EM | Admit: 2013-12-04 | Discharge: 2013-12-04 | Disposition: A | Discharge status: Home / Self Care | Payer: Medicaid Other | Source: Home / Self Care | Attending: Family Medicine | Admitting: Family Medicine

## 2013-12-04 DIAGNOSIS — L659 Nonscarring hair loss, unspecified: Secondary | ICD-10-CM

## 2013-12-04 MED ORDER — TERBINAFINE HCL 250 MG PO TABS: 250.0000 mg | ORAL_TABLET | Freq: Every day | ORAL | Status: DC

## 2013-12-04 NOTE — ED Provider Notes (Signed)
Jill BiblesShynequa L Reed is a 23 y.o. female who presents to Urgent Care today for cold spot. Patient notes a bald spot on her right parietal scalp. This is been present and worsening for the past one to 2 weeks. Her son was recently diagnosed with ringworm and she is concerned she's developed a tinea infection in her hair. She notes that it is somewhat itchy. She has similar both spot about 10 years ago that resolved spontaneously. No fevers chills nausea vomiting or diarrhea.   Past Medical History  Diagnosis Date  . Asthma   . Sickle cell trait   . Irregular heart beat   . Anxiety   . Depression    History  Substance Use Topics  . Smoking status: Former Smoker    Types: Cigarettes    Quit date: 10/17/2011  . Smokeless tobacco: Never Used  . Alcohol Use: No   ROS as above Medications: No current facility-administered medications for this encounter.   Current Outpatient Prescriptions  Medication Sig Dispense Refill  . albuterol (PROVENTIL HFA;VENTOLIN HFA) 108 (90 BASE) MCG/ACT inhaler Inhale 2 puffs into the lungs every 6 (six) hours as needed. For shortness of breath      . terbinafine (LAMISIL) 250 MG tablet Take 1 tablet (250 mg total) by mouth daily.  28 tablet  0  . [DISCONTINUED] promethazine (PHENERGAN) 25 MG tablet Take 0.5 tablets (12.5 mg total) by mouth every 6 (six) hours as needed for nausea or vomiting.  15 tablet  0    Exam:  BP 108/71  Pulse 60  Temp(Src) 98.4 F (36.9 C) (Oral)  Resp 14  SpO2 100%  LMP 09/14/2013  Breastfeeding? Unknown Gen: Well NAD HEENT: EOMI,  MMM  Lungs: Normal work of breathing. CTABL Heart: RRR no MRG Abd: NABS, Soft. NT, ND Exts: Brisk capillary refill, warm and well perfused.  Scalp: Quarter size smooth area on the right parietal scalp with a few hair follicles. No scarring or scaling seen.  No results found for this or any previous visit (from the past 24 hour(s)). No results found.  Assessment and Plan: 23 y.o. 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 Plan to treat with terbinafine and followup with dermatology   Discussed warning signs or symptoms. Please see discharge instructions. Patient expresses understanding.    Rodolph BongEvan S Corey, MD 12/04/13 [phone]

## 2013-12-04 NOTE — ED Notes (Signed)
C/o rash in hair which was noticed last week States area has got bigger since then

## 2013-12-04 NOTE — Discharge Instructions (Signed)
Thank you for coming in today. Take terbinafine daily for one month.  Followup with dermatology. Call make an appointment. Your primary care provider may be required to order a referral.  Come back as needed.   Alopecia Areata Alopecia areata is a self-destructing (autoimmune) disease that results in the loss of hair. In this condition your body's immune system attacks the hair follicle. The hair follicle is responsible for growing hair. Hair loss can occur on the scalp and other parts of the body. It usually starts as one or more small, round, smooth patches of hair loss. It occurs in males and females of all ages and races, but usually starts before age 25. The scalp is the most commonly affected area, but the beard or any hair-bearing site can be affected. This type of hair loss does not leave scars where the hair was lost.  Many people with alopecia areata only have a few patches of hair loss. In others, extensive patchy hair loss occurs. In a few people, all scalp hair is lost (alopecia totalis), or hair is lost from the entire scalp and body (alopecia universalis). No matter how widespread the hair loss, the hair follicles remain alive and are ready to resume normal hair production whenever they receive the correct signal. Hair re-growth may occur without treatment and can even restart after years of hair loss.  CAUSES  It is thought that something triggers the immune system to stop hair growth. It is not always known what the cause is. Some people have genetic markers that can increase the chance of developing alopecia areata. Alopecia areata often occurs in families whose members have had:  Asthma.  Hay fever.  Atopic eczema.  Some autoimmune diseases may also be a trigger, such as:  Thyroid disease.  Diabetes.  Rheumatoid arthritis.  Lupus erythematosus.  Vitiligo.  Pernicious anemia.  Addison's disease. OTHER SYMPTOMS In some people, the nail beds may develop rows of tiny  dents (stippling) or the nail beds can become distorted. Other than the hair and nail beds, no other body part is affected.  PROGNOSIS  Alopecia areata is not medically disabling. People with alopecia areata are usually in excellent health. Hair loss can be emotionally difficult. The National Alopecia Areata Foundation has resources available to help individuals and families with alopecia areata. Their goal is to help people with the condition live full, productive lives. There are many successful, well-adjusted, contented people living with Alopecia areata. Alopecia areata can be overcome with:  A positive self image.  Sound medical facts.  The support of others, such as:  Sometimes professional counseling is helpful to develop one's self-confidence and positive self-image. TREATMENT  There is no cure for alopecia areata. There are several available treatments. Treatments are most effective in milder cases. No treatment is effective for everyone. Choice of treatment depends mainly on a person's age and the extent of their hair loss. Alopecia areata occurs in two forms:   A mild patchy form where less than 50 percent of scalp hair is lost.  An extensive form where greater than 50 percent of scalp hair is lost. These two forms of alopecia areata behave quite differently, and the choice of treatment depends on which form is present. Current treatments do not turn alopecia areata off. They can stimulate the hair follicle to produce hair.  Some medications used to treat mild cases include:  Cortisone injections. The most common treatment is the injection of cortisone into the bare skin patches. The injections  are usually given by a caregiver specializing in skin issues (dermatologist). This caregiver will use a tiny needle to give multiple injections into the skin in and around the bare patches. The injections are repeated once a month. If new hair growth occurs, it is usually visible within 4  weeks. Treatment does not prevent new patches of hair loss from developing. There are few side effects from local cortisone injections. Occasionally, temporary dents (depressions) in the skin result from the local injections, but these dents can fill in by themselves.  Topical minoxidil. Five percent topical minoxidil solution applied twice daily may grow hair in alopecia areata. Scalp, eyebrows, and beard hair may respond. If scalp hair re-grows completely, treatment can be stopped. Response may improve if topical cortisone cream is applied 30 minutes after the minoxidil. Topical minoxidil is safe, easy to use, and does not lower blood pressure in persons with normal blood pressure. Minoxidil can lead to unwanted facial hair growth in some people.  Anthralin cream or ointment. Another treatment is the application of anthralin cream or ointment. Anthralin is a tar-like substance that has been used widely for psoriasis. Anthralin is applied to the bare patches once daily. It is washed off after a short time, usually 30 to 60 minutes later. If new hair growth occurs, it is seen in 8 to 12 weeks. Anthralin can be irritating to the skin. It can cause temporary, brownish discoloration of the treated skin. By using short treatment times, skin irritation and skin staining are reduced without decreasing effectiveness. Care must be taken not to get anthralin in the eyes. Some of the medications used for more extensive cases where there is greater than 50% hair loss include:  Cortisone pills. Cortisone pills are sometimes given for extensive scalp hair loss. Cortisone taken internally is much stronger than local injections of cortisone into the skin. It is necessary to discuss possible side effects of cortisone pills with your caregiver. In general, however, cortisone pills are used in relatively few patients with alopecia areata due to health risks from prolonged use. Also, hair that has grown is likely to fall out  when the cortisone pills are stopped.  Topical minoxidil. See previous explanation under mild, patchy alopecia areata. However, minoxidil is not effective in total loss of scalp hair (alopecia totalis).  Topical immunotherapy. Another method of treating alopecia areata or alopecia totalis/universalis involves producing an allergic rash or allergic contact dermatitis. Chemicals such as diphencyprone (DPCP) or squaric acid dibutyl ester (SADBE) are applied to the scalp to produce an allergic rash which resembles poison oak or ivy. Approximately 40% of patients treated with topical immunotherapy will re-grow scalp hair after about 6 months of treatment. Those who do successfully re-grow scalp hair will need to continue treatment to maintain hair re-growth.  Wigs. For extensive hair loss, a wig can be an important option for some people. Proper attention will make a quality wig look completely natural. A wig will need to be cut, thinned, and styled. To keep a net base wig from falling off, special double-sided tape can be purchased in beauty supply outlets and fastened to the inside of the wig.  For those with completely bare heads, there are suction caps to which any wig can be attached. There are also entire suction cap wig units. FOR MORE INFORMATION National Alopecia Areata Foundation: [email or web address] Document Released: 02/05/2004 Document Revised: 09/25/2011 Document Reviewed: 09/08/2008 [facility] Patient Information 2014 [place], [place].  Ringworm of the Scalp Tinea Capitis is also called scalp  ringworm. It is a fungal infection of the skin on the scalp seen mainly in children.  CAUSES  Scalp ringworm spreads from:  Other people.  Pets (cats and dogs) and animals.  Bedding, hats, combs or brushes shared with an infected person  Theater seats that an infected person sat in. SYMPTOMS  Scalp ringworm causes the following symptoms:  Flaky scales that look like dandruff.  Circles of thick,  raised red skin.  Hair loss.  Red pimples or pustules.  Swollen glands in the back of the neck.  Itching. DIAGNOSIS  A skin scraping or infected hairs will be sent to test for fungus. Testing can be done either by looking under the microscope (KOH examination) or by doing a culture (test to try to grow the fungus). A culture can take up to 2 weeks to come back. TREATMENT   Scalp ringworm must be treated with medicine by mouth to kill the fungus for 6 to 8 weeks.  Medicated shampoos (ketoconazole or selenium sulfide shampoo) may be used to decrease the shedding of fungal spores from the scalp.  Steroid medicines are used for severe cases that are very inflamed in conjunction with antifungal medication.  It is important that any family members or pets that have the fungus be treated. HOME CARE INSTRUCTIONS   Be sure to treat the rash completely  follow your caregiver's instructions. It can take a month or more to treat. If you do not treat it long enough, the rash can come back.  Watch for other cases in your family or pets.  Do not share brushes, combs, barrettes, or hats. Do not share towels.  Combs, brushes, and hats should be cleaned carefully and natural bristle brushes must be thrown away.  It is not necessary to shave the scalp or wear a hat during treatment.  Children may attend school once they start treatment with the oral medicine.  Be sure to follow up with your caregiver as directed to be sure the infection is gone. SEEK MEDICAL CARE IF:   Rash is worse.  Rash is spreading.  Rash returns after treatment is completed.  The rash is not better in 2 weeks with treatment. Fungal infections are slow to respond to treatment. Some redness may remain for several weeks after the fungus is gone. SEEK IMMEDIATE MEDICAL CARE IF:  The area becomes red, warm, tender, and swollen.  Pus is oozing from the rash.  You or your child has an oral temperature above 102 F (38.9  C), not controlled by medicine. Document Released: 06/30/2000 Document Revised: 09/25/2011 Document Reviewed: 08/12/2008 [facility] Patient Information 2014 [place], [place].

## 2014-02-23 ENCOUNTER — Encounter (HOSPITAL_COMMUNITY): Payer: Self-pay | Admitting: Emergency Medicine

## 2014-02-23 ENCOUNTER — Emergency Department (HOSPITAL_COMMUNITY)
Admission: EM | Admit: 2014-02-23 | Discharge: 2014-02-23 | Disposition: A | Discharge status: Home / Self Care | Payer: Medicaid Other | Attending: Emergency Medicine | Admitting: Emergency Medicine

## 2014-02-23 DIAGNOSIS — J45909 Unspecified asthma, uncomplicated: Secondary | ICD-10-CM | POA: Insufficient documentation

## 2014-02-23 DIAGNOSIS — Z3202 Encounter for pregnancy test, result negative: Secondary | ICD-10-CM | POA: Insufficient documentation

## 2014-02-23 DIAGNOSIS — Z8679 Personal history of other diseases of the circulatory system: Secondary | ICD-10-CM | POA: Insufficient documentation

## 2014-02-23 DIAGNOSIS — Z862 Personal history of diseases of the blood and blood-forming organs and certain disorders involving the immune mechanism: Secondary | ICD-10-CM | POA: Insufficient documentation

## 2014-02-23 DIAGNOSIS — Z87891 Personal history of nicotine dependence: Secondary | ICD-10-CM | POA: Insufficient documentation

## 2014-02-23 DIAGNOSIS — Z8659 Personal history of other mental and behavioral disorders: Secondary | ICD-10-CM | POA: Insufficient documentation

## 2014-02-23 DIAGNOSIS — Z202 Contact with and (suspected) exposure to infections with a predominantly sexual mode of transmission: Secondary | ICD-10-CM | POA: Insufficient documentation

## 2014-02-23 DIAGNOSIS — Z711 Person with feared health complaint in whom no diagnosis is made: Secondary | ICD-10-CM

## 2014-02-23 LAB — POC URINE PREG, ED: PREG TEST UR: NEGATIVE

## 2014-02-23 MED ORDER — CEFTRIAXONE SODIUM 250 MG IJ SOLR
250.0000 mg | Freq: Once | INTRAMUSCULAR | Status: AC
  Administered 2014-02-23: 250 mg via INTRAMUSCULAR
  Filled 2014-02-23: qty 250

## 2014-02-23 MED ORDER — AZITHROMYCIN 250 MG PO TABS
1000.0000 mg | ORAL_TABLET | Freq: Every day | ORAL | Status: DC
  Administered 2014-02-23: 1000 mg via ORAL
  Filled 2014-02-23: qty 4

## 2014-02-23 NOTE — Discharge Instructions (Signed)
Follow-up with the health dept for further STD needs.

## 2014-02-23 NOTE — ED Notes (Signed)
Per pt, boyfriend diagnosed with STD.  Pt given antibiotics.  Pt went to MD today to get treated.  No signs or symptoms.  Pt here to "get her shot" for treatment.  No abdominal pain, odor, discharge.  Pt on menstrual.

## 2014-02-23 NOTE — ED Provider Notes (Signed)
Medical screening examination/treatment/procedure(s) were performed by non-physician practitioner and as supervising physician I was immediately available for consultation/collaboration.   EKG Interpretation None        Matthew Gentry, MD 02/24/14 1459 

## 2014-02-23 NOTE — ED Provider Notes (Signed)
CSN: [id number]     Arrival date & time 02/23/14  1639 History  This chart was scribed for non-physician practitioner, Sharilyn SitesLisa Imaad Reuss, PA-C,working with Mirian MoMatthew Gentry, MD, by Karle PlumberJennifer Tensley, ED Scribe. This patient was seen in room WTR9/WTR9 and the patient's care was started at 5:58 PM  Chief Complaint  Patient presents with  . SEXUALLY TRANSMITTED DISEASE   The history is provided by the patient. No language interpreter was used.   HPI Comments:  Jill BiblesShynequa L Reed is a 23 y.o. female who presents to the Emergency Department complaining of a recent exposure to gonorrhea. Pt states her boyfriend was just diagnosed and treated for the STD and she wants to be treated as well. She denies any fever, abdominal pain, pelvic pain, dysuria, vaginal discharge, vaginal odor, rashes or any other symptoms.  Patient does not want pelvic exam, only wants treatment.  No prior hx STD.  Past Medical History  Diagnosis Date  . Asthma   . Sickle cell trait   . Irregular heart beat   . Anxiety   . Depression    Past Surgical History  Procedure Laterality Date  . No past surgeries     Family History  Problem Relation Age of Onset  . Sickle cell anemia Father    History  Substance Use Topics  . Smoking status: Former Smoker    Types: Cigarettes    Quit date: 10/17/2011  . Smokeless tobacco: Never Used  . Alcohol Use: No   OB History   Grav Para Term Preterm Abortions TAB SAB Ect Mult Living   2 1 0 1 0 0 0 0 0 1      Review of Systems  Constitutional: Negative for fever.  Gastrointestinal: Negative for abdominal pain.  Genitourinary: Negative for dysuria, vaginal discharge and vaginal pain.       STD exposure  All other systems reviewed and are negative.   Allergies  Carrot; Mushroom extract complex; and Peach  Home Medications   Prior to Admission medications   Medication Sig Start Date End Date Taking? Authorizing Provider  albuterol (PROVENTIL HFA;VENTOLIN HFA) 108 (90 BASE)  MCG/ACT inhaler Inhale 2 puffs into the lungs every 6 (six) hours as needed. For shortness of breath    Historical Provider, MD  terbinafine (LAMISIL) 250 MG tablet Take 1 tablet (250 mg total) by mouth daily. 12/04/13   Rodolph BongEvan S Corey, MD   Triage Vitals: BP 118/70  Pulse 72  Temp(Src) 99.4 F (37.4 C) (Oral)  Resp 18  SpO2 100%  LMP 02/18/2014 Physical Exam  Nursing note and vitals reviewed. Constitutional: She is oriented to person, place, and time. She appears well-developed and well-nourished. No distress.  HENT:  Head: Normocephalic and atraumatic.  Mouth/Throat: Oropharynx is clear and moist.  Eyes: Conjunctivae and EOM are normal. Pupils are equal, round, and reactive to light.  Neck: Normal range of motion. Neck supple.  Cardiovascular: Normal rate, regular rhythm and normal heart sounds.   Pulmonary/Chest: Effort normal and breath sounds normal.  Abdominal: Soft. Bowel sounds are normal. There is no tenderness. There is no guarding.  Musculoskeletal: Normal range of motion.  Neurological: She is alert and oriented to person, place, and time.  Skin: Skin is warm and dry. No rash noted. She is not diaphoretic.  Psychiatric: She has a normal mood and affect.    ED Course  Procedures (including critical care time) DIAGNOSTIC STUDIES: Oxygen Saturation is 100% on RA, normal by my interpretation.   COORDINATION OF CARE:  5:58 PM- Will treat for GC/Chlamydia. Pt verbalizes understanding and agrees to plan.  Medications  cefTRIAXone (ROCEPHIN) injection 250 mg (not administered)  azithromycin (ZITHROMAX) tablet 1,000 mg (not administered)    Labs Review Labs Reviewed  POC URINE PREG, ED    Imaging Review No results found.   EKG Interpretation None      MDM   Final diagnoses:  Concern about STD in female without diagnosis   Patient with known exposure to gonorrhea.  She is currently asx with benign exam.  VS stable.  She does not want pelvic exam, she only wants  to be treated.  Azithromycin and rocephin given in ED.  Low suspicion for PID given she is asx.  Encouraged to FU with health dept for further STD needs.  Discussed plan with patient, he/she acknowledged understanding and agreed with plan of care.  Return precautions given for new or worsening symptoms.  I personally performed the services described in this documentation, which was scribed in my presence. The recorded information has been reviewed and is accurate.    Garlon Hatchet, PA-C 02/23/14 1855  Garlon Hatchet, PA-C 02/23/14 [phone]

## 2014-02-27 NOTE — ED Provider Notes (Signed)
Medical screening examination/treatment/procedure(s) were performed by non-physician practitioner and as supervising physician I was immediately available for consultation/collaboration.   EKG Interpretation None        Mirian Mo, MD 02/27/14 1439

## 2014-03-04 ENCOUNTER — Telehealth (HOSPITAL_BASED_OUTPATIENT_CLINIC_OR_DEPARTMENT_OTHER): Payer: Self-pay

## 2014-03-04 NOTE — Telephone Encounter (Signed)
[facility] needing to know if pt had come in for STD tx.  Informed rcvd Zithromax 1,000 mg po and Rocephin 250 mg IM in ED 02/23/14.

## 2014-05-11 ENCOUNTER — Emergency Department (INDEPENDENT_AMBULATORY_CARE_PROVIDER_SITE_OTHER)
Admission: EM | Admit: 2014-05-11 | Discharge: 2014-05-11 | Disposition: A | Discharge status: Home / Self Care | Payer: Medicaid Other | Source: Home / Self Care

## 2014-05-11 ENCOUNTER — Encounter (HOSPITAL_COMMUNITY): Payer: Self-pay | Admitting: Emergency Medicine

## 2014-05-11 DIAGNOSIS — T63481A Toxic effect of venom of other arthropod, accidental (unintentional), initial encounter: Secondary | ICD-10-CM

## 2014-05-11 DIAGNOSIS — T63891A Toxic effect of contact with other venomous animals, accidental (unintentional), initial encounter: Secondary | ICD-10-CM

## 2014-05-11 MED ORDER — PREDNISONE 20 MG PO TABS: ORAL_TABLET | ORAL | Status: DC

## 2014-05-11 MED ORDER — CEPHALEXIN 500 MG PO CAPS: 500.0000 mg | ORAL_CAPSULE | Freq: Four times a day (QID) | ORAL | Status: DC

## 2014-05-11 MED ORDER — TRIAMCINOLONE ACETONIDE 0.1 % EX CREA: 1.0000 "application " | TOPICAL_CREAM | Freq: Two times a day (BID) | CUTANEOUS | Status: DC

## 2014-05-11 NOTE — ED Notes (Signed)
Pt here today because of a bite to her right thigh, area is red and warm to touch, pt said that bite happened on Saturday

## 2014-05-11 NOTE — Discharge Instructions (Signed)
Bee, Wasp, or Hornet Sting Use benadryl cream or gel Ice locally Only use the antibiotic if redness, pain or swelling increases Your caregiver has diagnosed you as having an insect sting. An insect sting appears as a red lump in the skin that sometimes has a tiny hole in the center, or it may have a stinger in the center of the wound. The most common stings are from wasps, hornets and bees. Individuals have different reactions to insect stings.  A normal reaction may cause pain, swelling, and redness around the sting site.  A localized allergic reaction may cause swelling and redness that extends beyond the sting site.  A large local reaction may continue to develop over the next 12 to 36 hours.  On occasion, the reactions can be severe (anaphylactic reaction). An anaphylactic reaction may cause wheezing; difficulty breathing; chest pain; fainting; raised, itchy, red patches on the skin; a sick feeling to your stomach (nausea); vomiting; cramping; or diarrhea. If you have had an anaphylactic reaction to an insect sting in the past, you are more likely to have one again. HOME CARE INSTRUCTIONS   With bee stings, a small sac of poison is left in the wound. Brushing across this with something such as a credit card, or anything similar, will help remove this and decrease the amount of the reaction. This same procedure will not help a wasp sting as they do not leave behind a stinger and poison sac.  Apply a cold compress for 10 to 20 minutes every hour for 1 to 2 days, depending on severity, to reduce swelling and itching.  To lessen pain, a paste made of water and baking soda may be rubbed on the bite or sting and left on for 5 minutes.  To relieve itching and swelling, you may use take medication or apply medicated creams or lotions as directed.  Only take over-the-counter or prescription medicines for pain, discomfort, or fever as directed by your caregiver.  Wash the sting site daily with  soap and water. Apply antibiotic ointment on the sting site as directed.  If you suffered a severe reaction:  If you did not require hospitalization, an adult will need to stay with you for 24 hours in case the symptoms return.  You may need to wear a medical bracelet or necklace stating the allergy.  You and your family need to learn when and how to use an anaphylaxis kit or epinephrine injection.  If you have had a severe reaction before, always carry your anaphylaxis kit with you. SEEK MEDICAL CARE IF:   None of the above helps within 2 to 3 days.  The area becomes red, warm, tender, and swollen beyond the area of the bite or sting.  You have an oral temperature above 102 F (38.9 C). SEEK IMMEDIATE MEDICAL CARE IF:  You have symptoms of an allergic reaction which are:  Wheezing.  Difficulty breathing.  Chest pain.  Lightheadedness or fainting.  Itchy, raised, red patches on the skin.  Nausea, vomiting, cramping or diarrhea. ANY OF THESE SYMPTOMS MAY REPRESENT A SERIOUS PROBLEM THAT IS AN EMERGENCY. Do not wait to see if the symptoms will go away. Get medical help right away. Call your local emergency services (911 in [place] DO NOT drive yourself to the hospital. MAKE SURE YOU:   Understand these instructions.  Will watch your condition.  Will get help right away if you are not doing well or get worse. Document Released: 07/03/2005 Document Revised: 09/25/2011 Document  Reviewed: 12/18/2009 ExitCare Patient Information 2015 [place], [place]. This information is not intended to replace advice given to you by your health care provider. Make sure you discuss any questions you have with your health care provider.  Insect Bite Mosquitoes, flies, fleas, bedbugs, and other insects can bite. Insect bites are different from insect stings. The bite may be red, puffy (swollen), and itchy for 2 to 4 days. Most bites get better on their own. HOME CARE   Do not scratch the  bite.  Keep the bite clean and dry. Wash the bite with soap and water.  Put ice on the bite.  Put ice in a plastic bag.  Place a towel between your skin and the bag.  Leave the ice on for 20 minutes, 4 times a day. Do this for the first 2 to 3 days, or as told by your doctor.  You may use medicated lotions or creams to lessen itching as told by your doctor.  Only take medicines as told by your doctor.  If you are given medicines (antibiotics), take them as told. Finish them even if you start to feel better. You may need a tetanus shot if:  You cannot remember when you had your last tetanus shot.  You have never had a tetanus shot.  The injury broke your skin. If you need a tetanus shot and you choose not to have one, you may get tetanus. Sickness from tetanus can be serious. GET HELP RIGHT AWAY IF:   You have more pain, redness, or puffiness.  You see a red line on the skin coming from the bite.  You have a fever.  You have joint pain.  You have a headache or neck pain.  You feel weak.  You have a rash.  You have chest pain, or you are short of breath.  You have belly (abdominal) pain.  You feel sick to your stomach (nauseous) or throw up (vomit).  You feel very tired or sleepy. MAKE SURE YOU:   Understand these instructions.  Will watch your condition.  Will get help right away if you are not doing well or get worse. Document Released: 06/30/2000 Document Revised: 09/25/2011 Document Reviewed: 02/01/2011 [facility] Patient Information 2015 [place], [place]. This information is not intended to replace advice given to you by your health care provider. Make sure you discuss any questions you have with your health care provider.

## 2014-05-11 NOTE — ED Provider Notes (Signed)
CSN: [id number]     Arrival date & time 05/11/14  1723 History   First MD Initiated Contact with Patient 05/11/14 1820     Chief Complaint  Patient presents with  . Insect Bite   (Consider location/radiation/quality/duration/timing/severity/associated sxs/prior Treatment) HPI Comments: 23 year old female complaining of an area of itching and erythema to the right anterior thigh. She states that she was stung or bitten by an insect about 48 h ago. Started as a small red papule and has increased in size with prominent itching, light redness and tenderness. Denies systemic sx's.   Past Medical History  Diagnosis Date  . Asthma   . Sickle cell trait   . Irregular heart beat   . Anxiety   . Depression    Past Surgical History  Procedure Laterality Date  . No past surgeries     Family History  Problem Relation Age of Onset  . Sickle cell anemia Father    History  Substance Use Topics  . Smoking status: Former Smoker    Types: Cigarettes    Quit date: 10/17/2011  . Smokeless tobacco: Never Used  . Alcohol Use: No   OB History   Grav Para Term Preterm Abortions TAB SAB Ect Mult Living   2 1 0 1 0 0 0 0 0 1      Review of Systems  Constitutional: Negative.   HENT: Negative.   Respiratory: Negative for cough, shortness of breath and wheezing.   Cardiovascular: Negative for chest pain and leg swelling.  Gastrointestinal: Negative for nausea and vomiting.  Musculoskeletal: Negative.   Skin: Positive for color change.  Neurological: Negative.     Allergies  Carrot; Mushroom extract complex; and Peach  Home Medications   Prior to Admission medications   Medication Sig Start Date End Date Taking? Authorizing Provider  albuterol (PROVENTIL HFA;VENTOLIN HFA) 108 (90 BASE) MCG/ACT inhaler Inhale 2 puffs into the lungs every 6 (six) hours as needed. For shortness of breath    Historical Provider, MD  cephALEXin (KEFLEX) 500 MG capsule Take 1 capsule (500 mg total) by mouth 4  (four) times daily. 05/11/14   Hayden Rasmussenavid Kang Ishida, NP  predniSONE (DELTASONE) 20 MG tablet Take 2 tabs daily for 5 days. Take with food. 05/11/14   Hayden Rasmussenavid Kaylie Ritter, NP  terbinafine (LAMISIL) 250 MG tablet Take 1 tablet (250 mg total) by mouth daily. 12/04/13   Rodolph BongEvan S Corey, MD  triamcinolone cream (KENALOG) 0.1 % Apply 1 application topically 2 (two) times daily. 05/11/14   Hayden Rasmussenavid Jonathandavid Marlett, NP   BP 104/67  Pulse 63  Temp(Src) 99.2 F (37.3 C) (Oral)  Resp 14  SpO2 100%  LMP 09/14/2013 Physical Exam  Nursing note and vitals reviewed. Constitutional: She is oriented to person, place, and time. She appears well-developed and well-nourished. No distress.  Eyes: Conjunctivae are normal.  Neck: Normal range of motion. Neck supple.  Pulmonary/Chest: Effort normal. No respiratory distress.  Musculoskeletal: Normal range of motion. She exhibits no edema.  Neurological: She is alert and oriented to person, place, and time.  Skin: Skin is warm and dry.  There is an annular area 8.5 cm x 9.0 cm of  Light erythema, slight induration to the distal anterior thigh. No lymphangitis. No area of drainage, bleeding or fluctuance.  Psychiatric: She has a normal mood and affect.    ED Course  Procedures (including critical care time) Labs Review Labs Reviewed - No data to display  Imaging Review No results found.   MDM  1. Insect sting, accidental or unintentional, initial encounter    This reaction is consistent with an envenomation and localized dermal allergic reaction.  Triamcinolone cream Benadryl cream Topical ice apps Prednisone low dose 5 d Keflex if redness, pain red streaks or swelling is worse or develops.      Hayden Rasmussenavid Zenda Herskowitz, NP 05/11/14 [phone]

## 2014-05-12 NOTE — ED Provider Notes (Signed)
Medical screening examination/treatment/procedure(s) were performed by resident physician or non-physician practitioner and as supervising physician I was immediately available for consultation/collaboration.   Barkley BrunsKINDL,Iain Sawchuk DOUGLAS MD.   Linna HoffJames D Jenalee Trevizo, MD 05/12/14 1014

## 2014-05-18 ENCOUNTER — Encounter (HOSPITAL_COMMUNITY): Payer: Self-pay | Admitting: Emergency Medicine

## 2014-06-19 IMAGING — US US OB TRANSVAGINAL
1 series · 14 of 28 positions shown · non-contrast
Comparison: None.

CLINICAL DATA: Abdominal pain and cramping.  LMP 09/14/2013.

EXAM:
OBSTETRIC <14 WK US AND TRANSVAGINAL OB US
TECHNIQUE: Both transabdominal and transvaginal ultrasound examinations were
performed for complete evaluation of the gestation as well as the
maternal uterus, adnexal regions, and pelvic cul-de-sac.
Transvaginal technique was performed to assess early pregnancy.

[Series 1: us ob comp less 14 wks · 32 acquisitions, 14 frames shown]
[im 2/32]
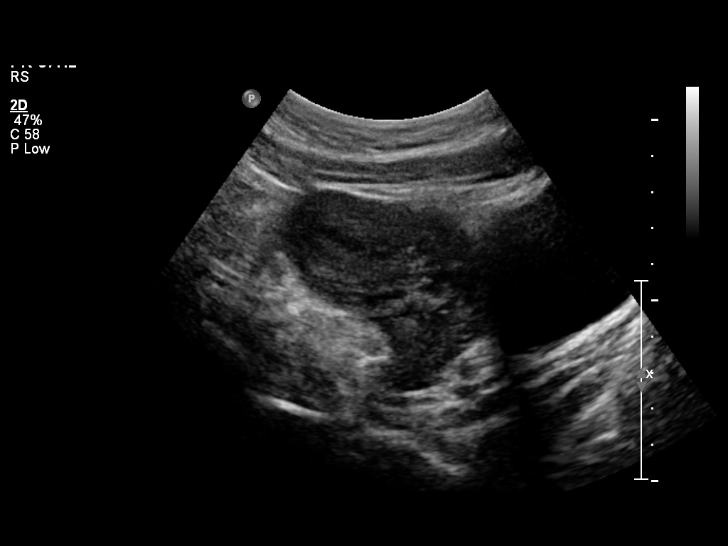
[im 4/32]
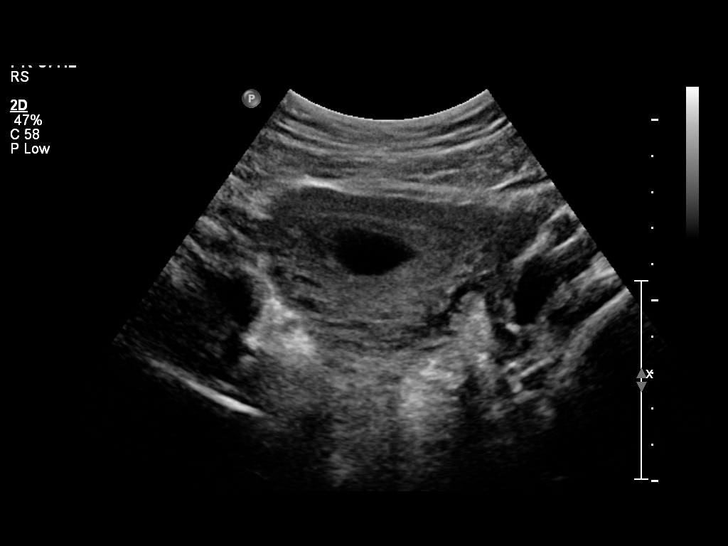
[im 6/32]
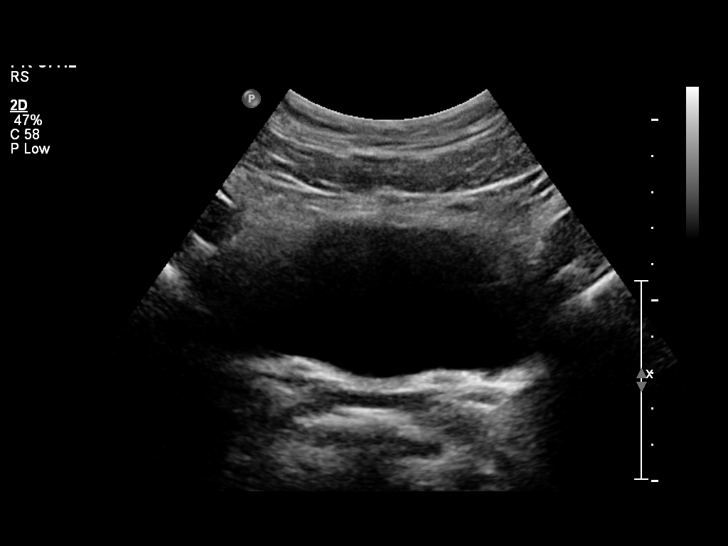
[im 9/32]
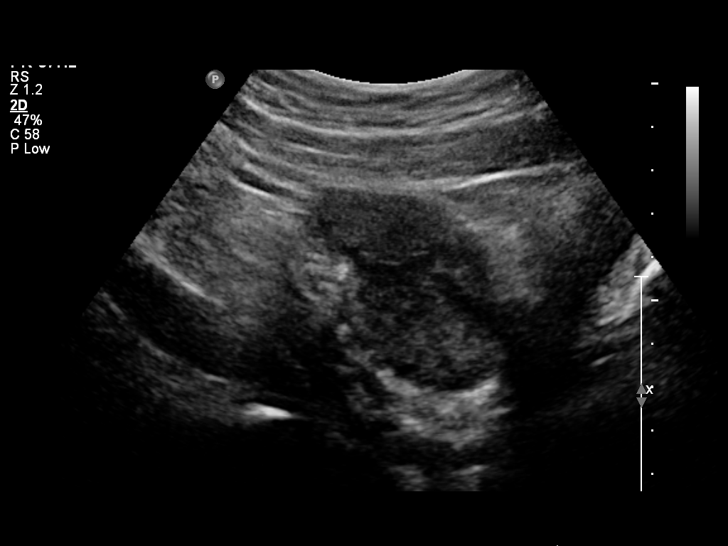
[im 11/32]
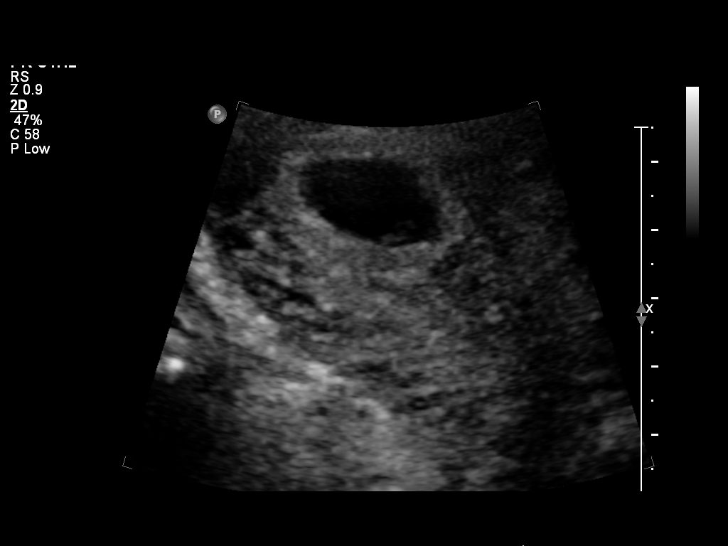
[im 13/32]
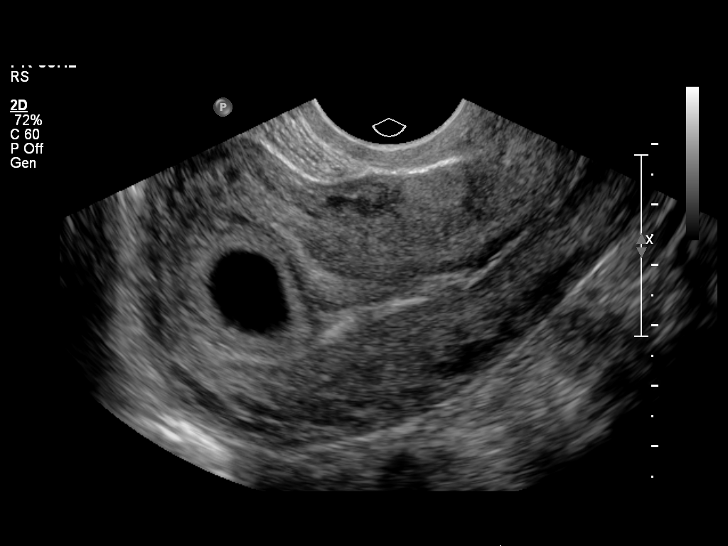
[im 15/32]
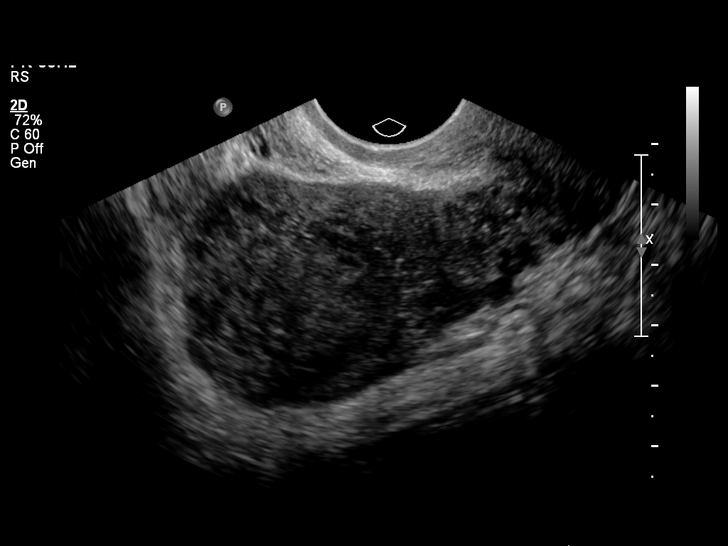
[im 18/32]
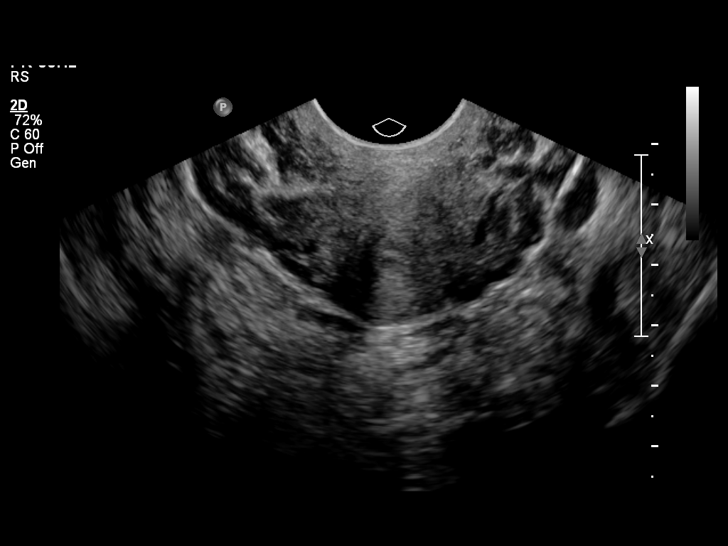
[im 20/32]
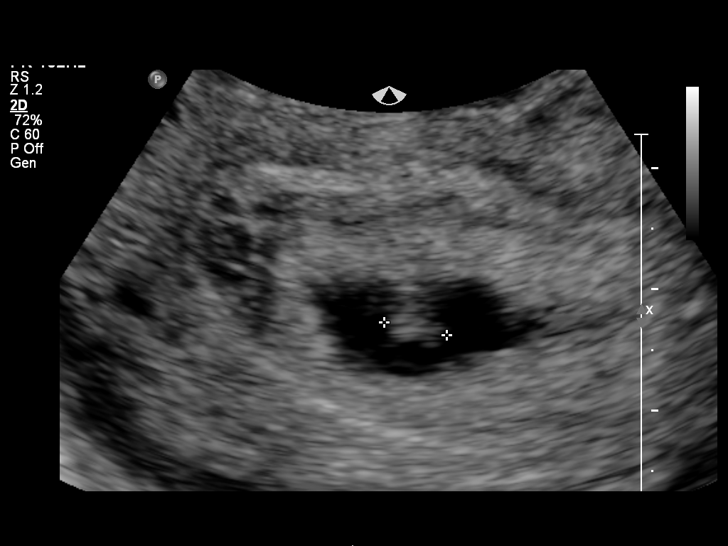
[im 22/32]
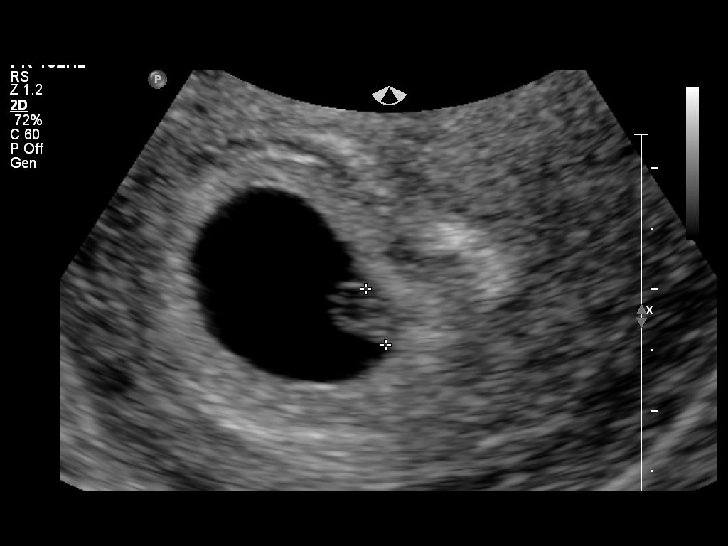
[im 25/32]
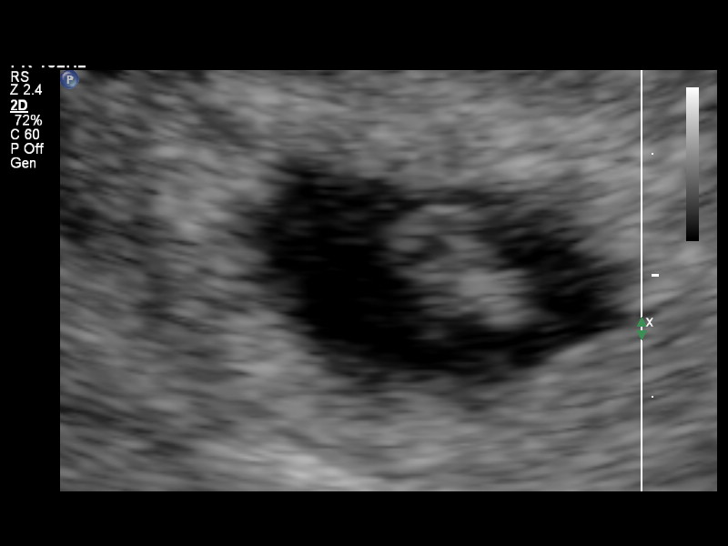
[im 27/32]
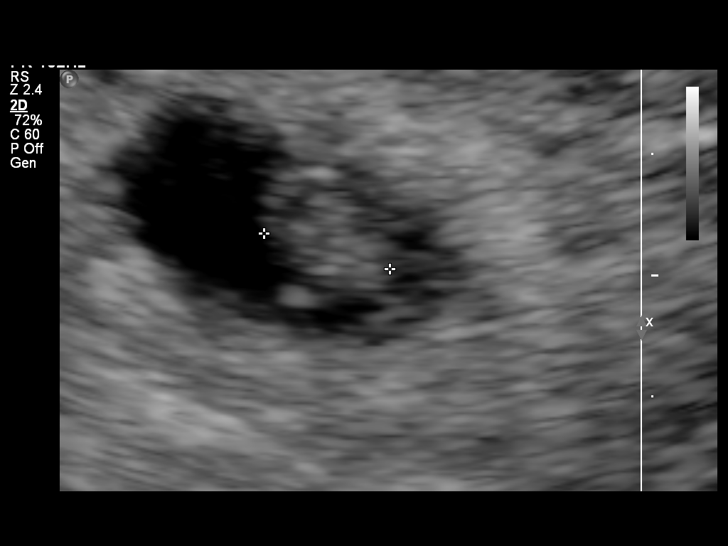
[im 29/32]
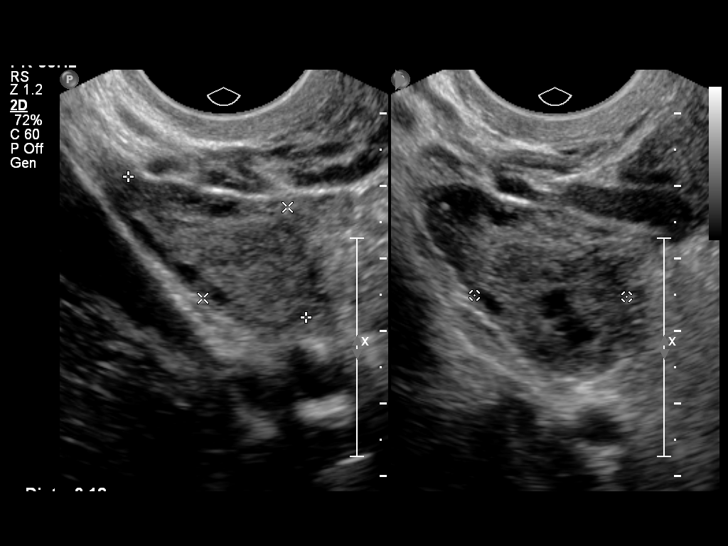
[im 32/32]
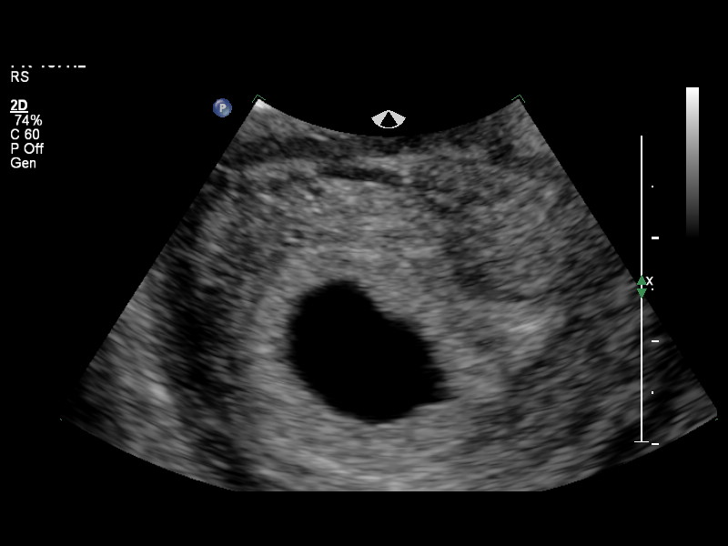

[14 of 28 positions shown; findings below may reference images not displayed]

FINDINGS: Intrauterine gestational sac: Visualized/normal in shape.

Yolk sac:  Present.

Embryo:  Present.

Cardiac Activity: Present.

Heart Rate:  120 bpm

CRL:   5.4  mm   6 w 3 d                  US EDC: 06/27/2014

Maternal uterus/adnexae: Unremarkable.
IMPRESSION: Single viable intrauterine gestation. Gestational age 7 weeks 2 days
by LMP.

## 2015-05-12 ENCOUNTER — Emergency Department (HOSPITAL_COMMUNITY)
Admission: EM | Admit: 2015-05-12 | Discharge: 2015-05-12 | Disposition: A | Discharge status: Home / Self Care | Payer: Medicaid Other | Attending: Physician Assistant | Admitting: Physician Assistant

## 2015-05-12 ENCOUNTER — Encounter (HOSPITAL_COMMUNITY): Payer: Self-pay | Admitting: Emergency Medicine

## 2015-05-12 ENCOUNTER — Emergency Department (HOSPITAL_COMMUNITY): Payer: Medicaid Other

## 2015-05-12 DIAGNOSIS — R102 Pelvic and perineal pain: Secondary | ICD-10-CM

## 2015-05-12 DIAGNOSIS — N83209 Unspecified ovarian cyst, unspecified side: Secondary | ICD-10-CM

## 2015-05-12 DIAGNOSIS — Z87891 Personal history of nicotine dependence: Secondary | ICD-10-CM | POA: Diagnosis not present

## 2015-05-12 DIAGNOSIS — Z79899 Other long term (current) drug therapy: Secondary | ICD-10-CM | POA: Insufficient documentation

## 2015-05-12 DIAGNOSIS — N8302 Follicular cyst of left ovary: Secondary | ICD-10-CM | POA: Diagnosis not present

## 2015-05-12 DIAGNOSIS — Z8659 Personal history of other mental and behavioral disorders: Secondary | ICD-10-CM | POA: Insufficient documentation

## 2015-05-12 DIAGNOSIS — Z3202 Encounter for pregnancy test, result negative: Secondary | ICD-10-CM | POA: Insufficient documentation

## 2015-05-12 DIAGNOSIS — R1032 Left lower quadrant pain: Secondary | ICD-10-CM | POA: Diagnosis present

## 2015-05-12 DIAGNOSIS — J45909 Unspecified asthma, uncomplicated: Secondary | ICD-10-CM | POA: Insufficient documentation

## 2015-05-12 DIAGNOSIS — Z862 Personal history of diseases of the blood and blood-forming organs and certain disorders involving the immune mechanism: Secondary | ICD-10-CM | POA: Diagnosis not present

## 2015-05-12 LAB — BASIC METABOLIC PANEL
ANION GAP: 10 (ref 5–15)
BUN: <5 mg/dL — ABNORMAL LOW (ref 6–20)
CALCIUM: 9.3 mg/dL (ref 8.9–10.3)
CHLORIDE: 103 mmol/L (ref 101–111)
CO2: 25 mmol/L (ref 22–32)
Creatinine, Ser: 0.63 mg/dL (ref 0.44–1.00)
GFR calc non Af Amer: >60 mL/min (ref >60)
GLUCOSE: 75 mg/dL (ref 65–99)
Potassium: 3.5 mmol/L (ref 3.5–5.1)
Sodium: 138 mmol/L (ref 135–145)

## 2015-05-12 LAB — CBC WITH DIFFERENTIAL/PLATELET
BASOS ABS: 0 10*3/uL (ref 0.0–0.1)
Basophils Relative: 0 %
Eosinophils Absolute: 0.1 10*3/uL (ref 0.0–0.7)
Eosinophils Relative: 1 %
HEMATOCRIT: 34.7 % — AB (ref 36.0–46.0)
HEMOGLOBIN: 12.2 g/dL (ref 12.0–15.0)
LYMPHS PCT: 16 %
Lymphs Abs: 1.7 10*3/uL (ref 0.7–4.0)
MCH: 31.9 pg (ref 26.0–34.0)
MCHC: 35.2 g/dL (ref 30.0–36.0)
MCV: 90.8 fL (ref 78.0–100.0)
MONO ABS: 0.8 10*3/uL (ref 0.1–1.0)
MONOS PCT: 7 %
NEUTROS ABS: 8.2 10*3/uL — AB (ref 1.7–7.7)
NEUTROS PCT: 76 %
Platelets: 183 10*3/uL (ref 150–400)
RBC: 3.82 MIL/uL — ABNORMAL LOW (ref 3.87–5.11)
RDW: 12.7 % (ref 11.5–15.5)
WBC: 10.9 10*3/uL — ABNORMAL HIGH (ref 4.0–10.5)

## 2015-05-12 LAB — URINALYSIS, ROUTINE W REFLEX MICROSCOPIC
Glucose, UA: NEGATIVE mg/dL
Hgb urine dipstick: NEGATIVE
Ketones, ur: 15 mg/dL — AB
NITRITE: NEGATIVE
PROTEIN: NEGATIVE mg/dL
SPECIFIC GRAVITY, URINE: 1.019 (ref 1.005–1.030)
Urobilinogen, UA: 1 mg/dL (ref 0.0–1.0)
pH: 5.5 (ref 5.0–8.0)

## 2015-05-12 LAB — WET PREP, GENITAL
CLUE CELLS WET PREP: NONE SEEN
TRICH WET PREP: NONE SEEN
Yeast Wet Prep HPF POC: NONE SEEN

## 2015-05-12 LAB — URINE MICROSCOPIC-ADD ON

## 2015-05-12 LAB — POC URINE PREG, ED: PREG TEST UR: NEGATIVE

## 2015-05-12 MED ORDER — KETOROLAC TROMETHAMINE 60 MG/2ML IM SOLN
30.0000 mg | Freq: Once | INTRAMUSCULAR | Status: AC
  Administered 2015-05-12: 30 mg via INTRAMUSCULAR
  Filled 2015-05-12: qty 2

## 2015-05-12 MED ORDER — NAPROXEN 500 MG PO TABS: 500.0000 mg | ORAL_TABLET | Freq: Two times a day (BID) | ORAL | Status: DC

## 2015-05-12 NOTE — ED Notes (Signed)
The pt just returned from us   

## 2015-05-12 NOTE — ED Notes (Signed)
Her pain is gone.  Waiting for us

## 2015-05-12 NOTE — ED Provider Notes (Signed)
CSN: [id number]     Arrival date & time 05/12/15  1247 History  By signing my name below, I, Gwenyth Ober, attest that this documentation has been prepared under the direction and in the presence of Kerrie Buffalo, NP.  Electronically Signed: Gwenyth Ober, ED Scribe. 05/12/2015. 3:39 PM.   Chief Complaint  Patient presents with  . Abdominal Pain  . Dysuria   Patient is a 24 y.o. female presenting with pelvic pain. The history is provided by the patient. No language interpreter was used.  Pelvic Pain This is a new problem. The current episode started in the past 7 days. The problem occurs intermittently. Associated symptoms include abdominal pain and a rash. Nothing aggravates the symptoms. She has tried nothing for the symptoms.   HPI Comments: Jill Reed is a 24 y.o. female with a history of chlamydia who presents to the Emergency Department complaining of constant, 9/10,  LLQ pain that radiates to her back. Pt states white vaginal discharge and loose stools as associated symptoms. She has tried increasing fluid intake with no relief. Pt has a history of kidney stones, but states this pain is worse than prior episodes. She is not on birth control and has had 1 partner in the last year. Pt denies constipation, nausea, vomiting, fever and chills as associated symptoms.  Pt also reports gradually improving, itching rash on her bilateral arms, elbows, back and buttocks that started last week and is mostly resolved. She states onset of symptoms started while staying in a hotel last week. Pt. Feels that the hotel had bed bugs.    Past Medical History  Diagnosis Date  . Asthma   . Sickle cell trait (HCC)   . Irregular heart beat   . Anxiety   . Depression    Past Surgical History  Procedure Laterality Date  . No past surgeries     Family History  Problem Relation Age of Onset  . Sickle cell anemia Father    Social History  Substance Use Topics  . Smoking status: Former Smoker     Types: Cigarettes    Quit date: 10/17/2011  . Smokeless tobacco: Never Used  . Alcohol Use: No   OB History    Gravida Para Term Preterm AB TAB SAB Ectopic Multiple Living   2 1 0 1 0 0 0 0 0 1      Review of Systems  Gastrointestinal: Positive for abdominal pain.  Genitourinary: Positive for dysuria, vaginal discharge and pelvic pain.  Musculoskeletal: Positive for back pain.  Skin: Positive for rash.  All other systems reviewed and are negative.   Allergies  Carrot; Mushroom extract complex; and Peach  Home Medications   Prior to Admission medications   Medication Sig Start Date End Date Taking? Authorizing Provider  albuterol (PROVENTIL HFA;VENTOLIN HFA) 108 (90 BASE) MCG/ACT inhaler Inhale 2 puffs into the lungs every 6 (six) hours as needed. For shortness of breath   Yes Historical Provider, MD  naproxen (NAPROSYN) 500 MG tablet Take 1 tablet (500 mg total) by mouth 2 (two) times daily. 05/12/15   Hope Orlene Och, NP   BP 104/77 mmHg  Pulse 74  Temp(Src) 98.1 F (36.7 C) (Oral)  Resp 16  SpO2 100% Physical Exam  Constitutional: She is oriented to person, place, and time. She appears well-developed and well-nourished. No distress.  HENT:  Head: Normocephalic and atraumatic.  Eyes: Conjunctivae and EOM are normal.  Neck: Normal range of motion. Neck supple. No tracheal deviation present.  Cardiovascular: Normal rate, regular rhythm and normal heart sounds.   Pulmonary/Chest: Effort normal and breath sounds normal. No respiratory distress. She has no wheezes. She has no rales.  Abdominal: Soft. There is no tenderness. There is no CVA tenderness.  Genitourinary: Vaginal discharge found.  Chaperone (scribe) was present for exam which was performed with no discomfort or complications.  External genitalia without lesions Yellow discharge in vaginal vault Cervix closed Mild cervical motion tenderness Bilateral adnexal tenderness.  Neurological: She is alert and oriented  to person, place, and time.  Skin: Skin is warm and dry.  Psychiatric: She has a normal mood and affect. Her behavior is normal.  Nursing note and vitals reviewed.   ED Course  Procedures  DIAGNOSTIC STUDIES: Oxygen Saturation is 100% on RA, normal by my interpretation.    COORDINATION OF CARE: 3:40 PM Discussed treatment plan with pt which includes lab work. Pt agreed to plan.  4:42 PM After pelvic exam, will order [place] for possible cyst on left ovary.  Labs Review Results for orders placed or performed during the hospital encounter of 05/12/15 (from the past 24 hour(s))  Urinalysis, Routine w reflex microscopic (not at [facility])     Status: Abnormal   Collection Time: 05/12/15  1:07 PM  Result Value Ref Range   Color, Urine AMBER (A) YELLOW   APPearance CLOUDY (A) CLEAR   Specific Gravity, Urine 1.019 1.005 - 1.030   pH 5.5 5.0 - 8.0   Glucose, UA NEGATIVE NEGATIVE mg/dL   Hgb urine dipstick NEGATIVE NEGATIVE   Bilirubin Urine SMALL (A) NEGATIVE   Ketones, ur 15 (A) NEGATIVE mg/dL   Protein, ur NEGATIVE NEGATIVE mg/dL   Urobilinogen, UA 1.0 0.0 - 1.0 mg/dL   Nitrite NEGATIVE NEGATIVE   Leukocytes, UA SMALL (A) NEGATIVE  Urine microscopic-add on     Status: Abnormal   Collection Time: 05/12/15  1:07 PM  Result Value Ref Range   Squamous Epithelial / LPF MANY (A) RARE   WBC, UA 3-6 <3 WBC/hpf   RBC / HPF 0-2 <3 RBC/hpf   Bacteria, UA MANY (A) RARE  POC urine preg, ED (not at [facility])     Status: None   Collection Time: 05/12/15  1:21 PM  Result Value Ref Range   Preg Test, Ur NEGATIVE NEGATIVE  CBC with Differential     Status: Abnormal   Collection Time: 05/12/15  3:41 PM  Result Value Ref Range   WBC 10.9 (H) 4.0 - 10.5 K/uL   RBC 3.82 (L) 3.87 - 5.11 MIL/uL   Hemoglobin 12.2 12.0 - 15.0 g/dL   HCT 16.1 (L) 09.6 - 04.5 %   MCV 90.8 78.0 - 100.0 fL   MCH 31.9 26.0 - 34.0 pg   MCHC 35.2 30.0 - 36.0 g/dL   RDW 40.9 81.1 - 91.4 %   Platelets 183 150 - 400 K/uL    Neutrophils Relative % 76 %   Neutro Abs 8.2 (H) 1.7 - 7.7 K/uL   Lymphocytes Relative 16 %   Lymphs Abs 1.7 0.7 - 4.0 K/uL   Monocytes Relative 7 %   Monocytes Absolute 0.8 0.1 - 1.0 K/uL   Eosinophils Relative 1 %   Eosinophils Absolute 0.1 0.0 - 0.7 K/uL   Basophils Relative 0 %   Basophils Absolute 0.0 0.0 - 0.1 K/uL  Basic metabolic panel     Status: Abnormal   Collection Time: 05/12/15  3:41 PM  Result Value Ref Range   Sodium 138 135 -  145 mmol/L   Potassium 3.5 3.5 - 5.1 mmol/L   Chloride 103 101 - 111 mmol/L   CO2 25 22 - 32 mmol/L   Glucose, Bld 75 65 - 99 mg/dL   BUN <5 (L) 6 - 20 mg/dL   Creatinine, Ser 1.610.63 0.44 - 1.00 mg/dL   Calcium 9.3 8.9 - 09.610.3 mg/dL   GFR calc non Af Amer >60 >60 mL/min   GFR calc Af Amer >60 >60 mL/min   Anion gap 10 5 - 15  Wet prep, genital     Status: Abnormal   Collection Time: 05/12/15  4:51 PM  Result Value Ref Range   Yeast Wet Prep HPF POC NONE SEEN NONE SEEN   Trich, Wet Prep NONE SEEN NONE SEEN   Clue Cells Wet Prep HPF POC NONE SEEN NONE SEEN   WBC, Wet Prep HPF POC MODERATE (A) NONE SEEN     Imaging Review [place] Transvaginal Non-ob  05/12/2015  CLINICAL DATA:  Left adnexal tenderness for 2 weeks. EXAM: TRANSABDOMINAL AND TRANSVAGINAL ULTRASOUND OF PELVIS TECHNIQUE: Both transabdominal and transvaginal ultrasound examinations of the pelvis were performed. Transabdominal technique was performed for global imaging of the pelvis including uterus, ovaries, adnexal regions, and pelvic cul-de-sac. It was necessary to proceed with endovaginal exam following the transabdominal exam to visualize the endometrium and ovaries in better detail. COMPARISON:  11/04/2013 FINDINGS: Uterus Measurements: 8.1 x 3.8 x 4.2 cm. No fibroids or other mass visualized. Endometrium Thickness: 11 mm.  No focal abnormality visualized. Right ovary Measurements: 3.2 x 2.2 x 2.3 cm. Within the right ovary, there is a complex hypoechoic cyst with a somewhat thickened  echogenic wall measuring 19 x 13 x 13 mm, compatible with a collapsing dominant follicle or cyst. Left ovary Measurements: 3.1 x 1.9 x 2.7 cm. Normal appearance/no adnexal mass. Other findings No significant free fluid. Echogenic debris within the bladder evident. IMPRESSION: Normal uterus, endometrium and left ovary. 19 mm complex right ovarian cystic structure suspicious for collapsing dominant follicle or cyst. No significant pelvic free fluid Echogenic debris within the bladder, nonspecific but can be seen with cystitis. Electronically Signed   By: Judie PetitM.  Shick M.D.   On: 05/12/2015 18:42   [place] Pelvis Complete  05/12/2015  CLINICAL DATA:  Left adnexal tenderness for 2 weeks. EXAM: TRANSABDOMINAL AND TRANSVAGINAL ULTRASOUND OF PELVIS TECHNIQUE: Both transabdominal and transvaginal ultrasound examinations of the pelvis were performed. Transabdominal technique was performed for global imaging of the pelvis including uterus, ovaries, adnexal regions, and pelvic cul-de-sac. It was necessary to proceed with endovaginal exam following the transabdominal exam to visualize the endometrium and ovaries in better detail. COMPARISON:  11/04/2013 FINDINGS: Uterus Measurements: 8.1 x 3.8 x 4.2 cm. No fibroids or other mass visualized. Endometrium Thickness: 11 mm.  No focal abnormality visualized. Right ovary Measurements: 3.2 x 2.2 x 2.3 cm. Within the right ovary, there is a complex hypoechoic cyst with a somewhat thickened echogenic wall measuring 19 x 13 x 13 mm, compatible with a collapsing dominant follicle or cyst. Left ovary Measurements: 3.1 x 1.9 x 2.7 cm. Normal appearance/no adnexal mass. Other findings No significant free fluid. Echogenic debris within the bladder evident. IMPRESSION: Normal uterus, endometrium and left ovary. 19 mm complex right ovarian cystic structure suspicious for collapsing dominant follicle or cyst. No significant pelvic free fluid Echogenic debris within the bladder, nonspecific but can be  seen with cystitis. Electronically Signed   By: Judie PetitM.  Shick M.D.   On: 05/12/2015 18:42  I have personally reviewed and evaluated these lab results as part of my medical decision-making.    MDM  24 y.o. female with pelvic pain that comes and goes over the past few days. Stable for d/c with improvement after Toradol 30 mg IM. She will follow up with Dr. Gaynell Face. Rx for NSAIDS. Discussed with the patient clinical,lab and u/s findings and all questioned fully answered.   Final diagnoses:  Pelvic pain in female  Ruptured ovarian cyst   I personally performed the services described in this documentation, which was scribed in my presence. The recorded information has been reviewed and is accurate.   [facility] Orlene Och, NP 05/12/15 1905  Courteney Randall An, MD 05/13/15 [phone]

## 2015-05-12 NOTE — ED Notes (Signed)
Pt sts lower abd and back pain with dysuria; pt sts thinks she may have bed bugs as well

## 2015-05-13 LAB — GC/CHLAMYDIA PROBE AMP (~~LOC~~) NOT AT ARMC
Chlamydia: NEGATIVE
Neisseria Gonorrhea: NEGATIVE

## 2015-05-13 LAB — RPR: RPR Ser Ql: NONREACTIVE

## 2015-05-13 LAB — HIV ANTIBODY (ROUTINE TESTING W REFLEX): HIV Screen 4th Generation wRfx: NONREACTIVE

## 2015-05-14 LAB — URINE CULTURE

## 2015-07-04 ENCOUNTER — Emergency Department (HOSPITAL_COMMUNITY)
Admission: EM | Admit: 2015-07-04 | Discharge: 2015-07-04 | Disposition: A | Discharge status: Home / Self Care | Payer: Medicaid Other | Attending: Emergency Medicine | Admitting: Emergency Medicine

## 2015-07-04 ENCOUNTER — Encounter (HOSPITAL_COMMUNITY): Payer: Self-pay | Admitting: Emergency Medicine

## 2015-07-04 DIAGNOSIS — Z202 Contact with and (suspected) exposure to infections with a predominantly sexual mode of transmission: Secondary | ICD-10-CM | POA: Diagnosis not present

## 2015-07-04 DIAGNOSIS — Z862 Personal history of diseases of the blood and blood-forming organs and certain disorders involving the immune mechanism: Secondary | ICD-10-CM | POA: Insufficient documentation

## 2015-07-04 DIAGNOSIS — N898 Other specified noninflammatory disorders of vagina: Secondary | ICD-10-CM | POA: Diagnosis present

## 2015-07-04 DIAGNOSIS — N760 Acute vaginitis: Secondary | ICD-10-CM | POA: Diagnosis not present

## 2015-07-04 DIAGNOSIS — Z87891 Personal history of nicotine dependence: Secondary | ICD-10-CM | POA: Insufficient documentation

## 2015-07-04 DIAGNOSIS — Z3200 Encounter for pregnancy test, result unknown: Secondary | ICD-10-CM | POA: Diagnosis not present

## 2015-07-04 DIAGNOSIS — J45909 Unspecified asthma, uncomplicated: Secondary | ICD-10-CM | POA: Insufficient documentation

## 2015-07-04 DIAGNOSIS — Z8659 Personal history of other mental and behavioral disorders: Secondary | ICD-10-CM | POA: Insufficient documentation

## 2015-07-04 DIAGNOSIS — B9689 Other specified bacterial agents as the cause of diseases classified elsewhere: Secondary | ICD-10-CM

## 2015-07-04 DIAGNOSIS — Z711 Person with feared health complaint in whom no diagnosis is made: Secondary | ICD-10-CM

## 2015-07-04 LAB — URINE MICROSCOPIC-ADD ON: Squamous Epithelial / LPF: NONE SEEN

## 2015-07-04 LAB — CBC WITH DIFFERENTIAL/PLATELET
Basophils Absolute: 0 10*3/uL (ref 0.0–0.1)
Basophils Relative: 0 %
Eosinophils Absolute: 0.1 10*3/uL (ref 0.0–0.7)
Eosinophils Relative: 2 %
HEMATOCRIT: 40.2 % (ref 36.0–46.0)
HEMOGLOBIN: 14 g/dL (ref 12.0–15.0)
LYMPHS ABS: 1.8 10*3/uL (ref 0.7–4.0)
Lymphocytes Relative: 36 %
MCH: 32.1 pg (ref 26.0–34.0)
MCHC: 34.8 g/dL (ref 30.0–36.0)
MCV: 92.2 fL (ref 78.0–100.0)
MONOS PCT: 8 %
Monocytes Absolute: 0.4 10*3/uL (ref 0.1–1.0)
NEUTROS ABS: 2.7 10*3/uL (ref 1.7–7.7)
NEUTROS PCT: 54 %
Platelets: 205 10*3/uL (ref 150–400)
RBC: 4.36 MIL/uL (ref 3.87–5.11)
RDW: 13 % (ref 11.5–15.5)
WBC: 5.1 10*3/uL (ref 4.0–10.5)

## 2015-07-04 LAB — BASIC METABOLIC PANEL
Anion gap: 10 (ref 5–15)
BUN: 7 mg/dL (ref 6–20)
CHLORIDE: 105 mmol/L (ref 101–111)
CO2: 27 mmol/L (ref 22–32)
CREATININE: 0.81 mg/dL (ref 0.44–1.00)
Calcium: 9.6 mg/dL (ref 8.9–10.3)
GFR calc Af Amer: >60 mL/min (ref >60)
GFR calc non Af Amer: >60 mL/min (ref >60)
GLUCOSE: 117 mg/dL — AB (ref 65–99)
Potassium: 3.7 mmol/L (ref 3.5–5.1)
Sodium: 142 mmol/L (ref 135–145)

## 2015-07-04 LAB — URINALYSIS, ROUTINE W REFLEX MICROSCOPIC
BILIRUBIN URINE: NEGATIVE
Glucose, UA: NEGATIVE mg/dL
Hgb urine dipstick: NEGATIVE
KETONES UR: NEGATIVE mg/dL
NITRITE: NEGATIVE
PH: 6 (ref 5.0–8.0)
PROTEIN: NEGATIVE mg/dL
SPECIFIC GRAVITY, URINE: 1.014 (ref 1.005–1.030)

## 2015-07-04 LAB — WET PREP, GENITAL
Sperm: NONE SEEN
Trich, Wet Prep: NONE SEEN
YEAST WET PREP: NONE SEEN

## 2015-07-04 MED ORDER — CEFTRIAXONE SODIUM 250 MG IJ SOLR
250.0000 mg | Freq: Once | INTRAMUSCULAR | Status: AC
  Administered 2015-07-04: 250 mg via INTRAMUSCULAR
  Filled 2015-07-04: qty 250

## 2015-07-04 MED ORDER — AZITHROMYCIN 250 MG PO TABS
1000.0000 mg | ORAL_TABLET | Freq: Once | ORAL | Status: AC
  Administered 2015-07-04: 1000 mg via ORAL
  Filled 2015-07-04: qty 4

## 2015-07-04 MED ORDER — LIDOCAINE HCL 2 % IJ SOLN
INTRAMUSCULAR | Status: AC
  Filled 2015-07-04: qty 20

## 2015-07-04 MED ORDER — METRONIDAZOLE 500 MG PO TABS: 500.0000 mg | ORAL_TABLET | Freq: Two times a day (BID) | ORAL | Status: DC

## 2015-07-04 MED ORDER — LIDOCAINE HCL (PF) 1 % IJ SOLN
INTRAMUSCULAR | Status: AC
  Filled 2015-07-04: qty 5

## 2015-07-04 NOTE — ED Provider Notes (Signed)
CSN: [id number]     Arrival date & time 07/04/15  5621 History   First MD Initiated Contact with Patient 07/04/15 [phone]     Chief Complaint  Patient presents with  . Abdominal Pain  . Vaginal Discharge    HPI   Jill Reed is a 24 y.o. female with a PMH of asthma, anxiety, depression who presents to the ED with lower abdominal pain and malodorous vaginal discharge for the past month. She reports her symptoms are intermittent. She denies exacerbating factors. She has tried taking tylenol without significant symptom relief. She denies fever, chills, nausea, vomiting, constipation. She reports loose stools. She denies dysuria, though reports urinary frequency.   Past Medical History  Diagnosis Date  . Asthma   . Sickle cell trait (HCC)   . Irregular heart beat   . Anxiety   . Depression    Past Surgical History  Procedure Laterality Date  . No past surgeries     Family History  Problem Relation Age of Onset  . Sickle cell anemia Father    Social History  Substance Use Topics  . Smoking status: Former Smoker    Types: Cigarettes    Quit date: 10/17/2011  . Smokeless tobacco: Never Used  . Alcohol Use: No   OB History    Gravida Para Term Preterm AB TAB SAB Ectopic Multiple Living        Review of Systems  Constitutional: Negative for fever and chills.  Gastrointestinal: Positive for abdominal pain and diarrhea. Negative for nausea, vomiting and constipation.  Genitourinary: Positive for frequency and vaginal discharge. Negative for dysuria, urgency and vaginal bleeding.  All other systems reviewed and are negative.     Allergies  Carrot; Mushroom extract complex; and Peach  Home Medications   Prior to Admission medications   Medication Sig Start Date End Date Taking? Authorizing Provider  Diphenhydramine-APAP, sleep, (TYLENOL PM EXTRA STRENGTH) 50-1000 MG/30ML LIQD Take 10 mLs by mouth at bedtime as needed (pain).   Yes Historical  Provider, MD  metroNIDAZOLE (FLAGYL) 500 MG tablet Take 1 tablet (500 mg total) by mouth 2 (two) times daily. 07/04/15   Mady Gemma, PA-C  naproxen (NAPROSYN) 500 MG tablet Take 1 tablet (500 mg total) by mouth 2 (two) times daily. Patient not taking: Reported on 07/04/2015 05/12/15   Janne Napoleon, NP    BP 100/63 mmHg  Pulse 67  Temp(Src) 98.3 F (36.8 C) (Oral)  Resp 16  SpO2 100%  LMP 06/17/2015 Physical Exam  Constitutional: She is oriented to person, place, and time. She appears well-developed and well-nourished. No distress.  HENT:  Head: Normocephalic and atraumatic.  Right Ear: External ear normal.  Left Ear: External ear normal.  Nose: Nose normal.  Mouth/Throat: Uvula is midline, oropharynx is clear and moist and mucous membranes are normal.  Eyes: Conjunctivae, EOM and lids are normal. Pupils are equal, round, and reactive to light. Right eye exhibits no discharge. Left eye exhibits no discharge. No scleral icterus.  Neck: Normal range of motion. Neck supple.  Cardiovascular: Normal rate, regular rhythm, normal heart sounds, intact distal pulses and normal pulses.   Pulmonary/Chest: Effort normal and breath sounds normal. No respiratory distress. She has no wheezes. She has no rales.  Abdominal: Soft. Normal appearance and bowel sounds are normal. She exhibits no distension and no mass. There is no tenderness. There is no rigidity, no rebound and no guarding.  Genitourinary: Uterus is not enlarged and not tender. Cervix exhibits discharge. Cervix exhibits no motion tenderness and no friability. Right adnexum displays no mass, no tenderness and no fullness. Left adnexum displays no mass, no tenderness and no fullness. No erythema, tenderness or bleeding in the vagina. No foreign body around the vagina. No signs of injury around the vagina. Vaginal discharge found.  Moderate amount of thick yellow discharge present in vaginal vault.  Musculoskeletal: Normal range of  motion. She exhibits no edema or tenderness.  Neurological: She is alert and oriented to person, place, and time.  Skin: Skin is warm, dry and intact. No rash noted. She is not diaphoretic. No erythema. No pallor.  Psychiatric: She has a normal mood and affect. Her speech is normal and behavior is normal.  Nursing note and vitals reviewed.   ED Course  Procedures (including critical care time)  Labs Review Labs Reviewed  WET PREP, GENITAL - Abnormal; Notable for the following:    Clue Cells Wet Prep HPF POC PRESENT (*)    WBC, Wet Prep HPF POC MANY (*)    All other components within normal limits  URINALYSIS, ROUTINE W REFLEX MICROSCOPIC (NOT AT [facility]) - Abnormal; Notable for the following:    APPearance CLOUDY (*)    Leukocytes, UA TRACE (*)    All other components within normal limits  BASIC METABOLIC PANEL - Abnormal; Notable for the following:    Glucose, Bld 117 (*)    All other components within normal limits  URINE MICROSCOPIC-ADD ON - Abnormal; Notable for the following:    Bacteria, UA RARE (*)    All other components within normal limits  CBC WITH DIFFERENTIAL/PLATELET  CBC WITH DIFFERENTIAL/PLATELET  POC URINE PREG, ED  GC/CHLAMYDIA PROBE AMP ([place]) NOT AT [facility]    Imaging Review No results found.   I have personally reviewed and evaluated these lab results as part of my medical decision-making.   EKG Interpretation None      MDM   Final diagnoses:  BV (bacterial vaginosis)  Concern about STD in female without diagnosis    24 year old female presents with lower abdominal pain and malodorous vaginal discharge x 1 month. Denies fever, chills, nausea, vomiting, constipation. Reports loose stools. Denies dysuria, though reports urinary frequency.  Patient is afebrile. Vital signs stable. Abdomen soft, nontender, nondistended. No rebound, guarding, or masses. Moderate amount of thick yellow discharge present in vaginal vault. No cervical motion tenderness  or cervical friability. No adnexal or uterine tenderness.  CBC negative for leukocytosis or anemia. BMP unremarkable. UA negative for infection. Wet prep with clue cells and many WBCs.  Will treat BV with flagyl (advised not to drink alcohol while taking this medication). Given exam findings and increased WBC on wet prep, will treat prophylactically with azithromycin and rocephin. Patient has a benign abdominal and pelvic exam and reassuring vitals, low suspicion for PID. Patient understands she has GC/chlamydia cultures pending and if results return positive, she should inform all sexual partners. Discussed importance of using protection while sexually active. Patient to follow up with PCP. Return precautions discussed. Patient verbalizes her understanding and is in agreement with plan.  BP 100/63 mmHg  Pulse 67  Temp(Src) 98.3 F (36.8 C) (Oral)  Resp 16  SpO2 100%  LMP 06/17/2015     Mady GemmaElizabeth C Rebacca Vot[place], PA-C 07/04/15 1531  Geoffery Lyonsouglas Delo, MD 07/05/15 [phone]

## 2015-07-04 NOTE — Discharge Instructions (Signed)
1. Medications: flagyl (do not drink while taking this medication), usual home medications 2. Treatment: rest, drink plenty of fluids 3. Follow Up: please followup with your primary doctor as needed for discussion of your diagnoses and further evaluation after today's visit; if you do not have a primary care doctor use the resource guide provided to find one; please return to the ER for high fever, severe abdominal pain, new or worsening symptoms   Bacterial Vaginosis Bacterial vaginosis is a vaginal infection that occurs when the normal balance of bacteria in the vagina is disrupted. It results from an overgrowth of certain bacteria. This is the most common vaginal infection in women of childbearing age. Treatment is important to prevent complications, especially in pregnant women, as it can cause a premature delivery. CAUSES  Bacterial vaginosis is caused by an increase in harmful bacteria that are normally present in smaller amounts in the vagina. Several different kinds of bacteria can cause bacterial vaginosis. However, the reason that the condition develops is not fully understood. RISK FACTORS Certain activities or behaviors can put you at an increased risk of developing bacterial vaginosis, including:  Having a new sex partner or multiple sex partners.  Douching.  Using an intrauterine device (IUD) for contraception. Women do not get bacterial vaginosis from toilet seats, bedding, swimming pools, or contact with objects around them. SIGNS AND SYMPTOMS  Some women with bacterial vaginosis have no signs or symptoms. Common symptoms include:  Grey vaginal discharge.  A fishlike odor with discharge, especially after sexual intercourse.  Itching or burning of the vagina and vulva.  Burning or pain with urination. DIAGNOSIS  Your health care provider will take a medical history and examine the vagina for signs of bacterial vaginosis. A sample of vaginal fluid may be taken. Your health  care provider will look at this sample under a microscope to check for bacteria and abnormal cells. A vaginal pH test may also be done.  TREATMENT  Bacterial vaginosis may be treated with antibiotic medicines. These may be given in the form of a pill or a vaginal cream. A second round of antibiotics may be prescribed if the condition comes back after treatment. Because bacterial vaginosis increases your risk for sexually transmitted diseases, getting treated can help reduce your risk for chlamydia, gonorrhea, HIV, and herpes. HOME CARE INSTRUCTIONS   Only take over-the-counter or prescription medicines as directed by your health care provider.  If antibiotic medicine was prescribed, take it as directed. Make sure you finish it even if you start to feel better.  Tell all sexual partners that you have a vaginal infection. They should see their health care provider and be treated if they have problems, such as a mild rash or itching.  During treatment, it is important that you follow these instructions:  Avoid sexual activity or use condoms correctly.  Do not douche.  Avoid alcohol as directed by your health care provider.  Avoid breastfeeding as directed by your health care provider. SEEK MEDICAL CARE IF:   Your symptoms are not improving after 3 days of treatment.  You have increased discharge or pain.  You have a fever. MAKE SURE YOU:   Understand these instructions.  Will watch your condition.  Will get help right away if you are not doing well or get worse. FOR MORE INFORMATION  Centers for Disease Control and Prevention, Division of STD Prevention: SolutionApps.co.zawww.cdc.gov/std American Sexual Health Association (ASHA): www.ashastd.org    This information is not intended to replace advice  given to you by your health care provider. Make sure you discuss any questions you have with your health care provider.   Document Released: 07/03/2005 Document Revised: 07/24/2014 Document Reviewed:  02/12/2013 Elsevier Interactive Patient Education 2016 [employer].  Sexually Transmitted Disease A sexually transmitted disease (STD) is a disease or infection that may be passed (transmitted) from person to person, usually during sexual activity. This may happen by way of saliva, semen, blood, vaginal mucus, or urine. Common STDs include:  Gonorrhea.  Chlamydia.  Syphilis.  HIV and AIDS.  Genital herpes.  Hepatitis B and C.  Trichomonas.  Human papillomavirus (HPV).  Pubic lice.  Scabies.  Mites.  Bacterial vaginosis. WHAT ARE CAUSES OF STDs? An STD may be caused by bacteria, a virus, or parasites. STDs are often transmitted during sexual activity if one person is infected. However, they may also be transmitted through nonsexual means. STDs may be transmitted after:   Sexual intercourse with an infected person.  Sharing sex toys with an infected person.  Sharing needles with an infected person or using unclean piercing or tattoo needles.  Having intimate contact with the genitals, mouth, or rectal areas of an infected person.  Exposure to infected fluids during birth. WHAT ARE THE SIGNS AND SYMPTOMS OF STDs? Different STDs have different symptoms. Some people may not have any symptoms. If symptoms are present, they may include:  Painful or bloody urination.  Pain in the pelvis, abdomen, vagina, anus, throat, or eyes.  A skin rash, itching, or irritation.  Growths, ulcerations, blisters, or sores in the genital and anal areas.  Abnormal vaginal discharge with or without bad odor.  Penile discharge in men.  Fever.  Pain or bleeding during sexual intercourse.  Swollen glands in the groin area.  Yellow skin and eyes (jaundice). This is seen with hepatitis.  Swollen testicles.  Infertility.  Sores and blisters in the mouth. HOW ARE STDs DIAGNOSED? To make a diagnosis, your health care provider may:  Take a medical history.  Perform a physical  exam.  Take a sample of any discharge to examine.  Swab the throat, cervix, opening to the penis, rectum, or vagina for testing.  Test a sample of your first morning urine.  Perform blood tests.  Perform a Pap test, if this applies.  Perform a colposcopy.  Perform a laparoscopy. HOW ARE STDs TREATED? Treatment depends on the STD. Some STDs may be treated but not cured.  Chlamydia, gonorrhea, trichomonas, and syphilis can be cured with antibiotic medicine.  Genital herpes, hepatitis, and HIV can be treated, but not cured, with prescribed medicines. The medicines lessen symptoms.  Genital warts from HPV can be treated with medicine or by freezing, burning (electrocautery), or surgery. Warts may come back.  HPV cannot be cured with medicine or surgery. However, abnormal areas may be removed from the cervix, vagina, or vulva.  If your diagnosis is confirmed, your recent sexual partners need treatment. This is true even if they are symptom-free or have a negative culture or evaluation. They should not have sex until their health care providers say it is okay.  Your health care provider may test you for infection again 3 months after treatment. HOW CAN I REDUCE MY RISK OF GETTING AN STD? Take these steps to reduce your risk of getting an STD:  Use latex condoms, dental dams, and water-soluble lubricants during sexual activity. Do not use petroleum jelly or oils.  Avoid having multiple sex partners.  Do not have sex with  someone who has other sex partners  Do not have sex with anyone you do not know or who is at high risk for an STD.  Avoid risky sex practices that can break your skin.  Do not have sex if you have open sores on your mouth or skin.  Avoid drinking too much alcohol or taking illegal drugs. Alcohol and drugs can affect your judgment and put you in a vulnerable position.  Avoid engaging in oral and anal sex acts.  Get vaccinated for HPV and hepatitis. If you have  not received these vaccines in the past, talk to your health care provider about whether one or both might be right for you.  If you are at risk of being infected with HIV, it is recommended that you take a prescription medicine daily to prevent HIV infection. This is called pre-exposure prophylaxis (PrEP). You are considered at risk if:  You are a man who has sex with other men (MSM).  You are a heterosexual man or woman and are sexually active with more than one partner.  You take drugs by injection.  You are sexually active with a partner who has HIV.  Talk with your health care provider about whether you are at high risk of being infected with HIV. If you choose to begin PrEP, you should first be tested for HIV. You should then be tested every 3 months for as long as you are taking PrEP. WHAT SHOULD I DO IF I THINK I HAVE AN STD?  See your health care provider.  Tell your sexual partner(s). They should be tested and treated for any STDs.  Do not have sex until your health care provider says it is okay. WHEN SHOULD I GET IMMEDIATE MEDICAL CARE? Contact your health care provider right away if:   You have severe abdominal pain.  You are a man and notice swelling or pain in your testicles.  You are a woman and notice swelling or pain in your vagina.   This information is not intended to replace advice given to you by your health care provider. Make sure you discuss any questions you have with your health care provider.   Document Released: 09/23/2002 Document Revised: 07/24/2014 Document Reviewed: 01/21/2013 Elsevier Interactive Patient Education 2016 [employer].   Emergency Department Resource Guide 1) Find a Doctor and Pay Out of Pocket Although you won't have to find out who is covered by your insurance plan, it is a good idea to ask around and get recommendations. You will then need to call the office and see if the doctor you have chosen will accept you as a new patient  and what types of options they offer for patients who are self-pay. Some doctors offer discounts or will set up payment plans for their patients who do not have insurance, but you will need to ask so you aren't surprised when you get to your appointment.  2) Contact Your Local Health Department Not all health departments have doctors that can see patients for sick visits, but many do, so it is worth a call to see if yours does. If you don't know where your local health department is, you can check in your phone book. The CDC also has a tool to help you locate your state's health department, and many state websites also have listings of all of their local health departments.  3) Find a Walk-in Clinic If your illness is not likely to be very severe or complicated, you may  want to try a walk in clinic. These are popping up all over the country in pharmacies, drugstores, and shopping centers. They're usually staffed by nurse practitioners or physician assistants that have been trained to treat common illnesses and complaints. They're usually fairly quick and inexpensive. However, if you have serious medical issues or chronic medical problems, these are probably not your best option.  No Primary Care Doctor: - Call Health Connect at  [phone] - they can help you locate a primary care doctor that  accepts your insurance, provides certain services, etc. - Physician Referral Service- [phone]  Chronic Pain Problems: Organization         Address  Phone   Notes  [facility]  [phone] Patients need to be referred by their primary care doctor.   Medication Assistance: Organization         Address  Phone   Notes  [facility] Medication [facility] [address] [place], [place] [address] [phone] --Must be a resident of [facility] -- Must have NO insurance coverage whatsoever (no Medicaid/ Medicare, etc.) -- The pt. MUST have a primary care  doctor that directs their care regularly and follows them in the community   [facility]  [phone]   [employer]  [phone]    Agencies that provide inexpensive medical care: Organization         Address  Phone   Notes  [facility]  [phone]   [facility]    [phone]   [facility] [address] [place], [place] [address] [phone]   [facility] [address] [place] [phone]   Planned Parenthood    [phone]   [facility]    [phone]   [facility] and [facility]  [address], [place] Phone:  [phone], Fax:  [phone] Hours of Operation:  9 am - 6 pm, M-F.  Also accepts Medicaid/Medicare and self-pay.  [facility]  [address], [place] Phone: [phone], Fax: [phone]. Hours of Operation:  8:30 am - 5:30 pm, M-F.  Also accepts Medicaid and self-pay.  [facility] [place] [address] [place] Phone: [phone]   [facility] [address] Natasha Bence [place], [place] [phone], Ext. [phone] Mondays & Thursdays: 7-9 AM.  First 15 patients are seen on a first come, first serve basis.    Medicaid-accepting [facility] Providers:  Organization         Address  Phone   Notes  [facility] [address],  [phone] Also accepts self-pay patients.  [facility] [address] Laurell Josephs [place], [place]  [phone]   [facility] [address], [place] [phone]   [facility] Family [facility] [address] [place] [phone]   Renaye Rakers [address], [place]   [phone] Only accepts [place] Access [place] patients after they have their name applied to their card.   Self-Pay (no insurance) in [facility]:  Organization         Address  Phone   Notes  Sickle Cell Patients, [facility] [address] [place], [place] [phone]   [facility] [address] [place], [place] [phone])  [phone]   [facility] [place]  [address], [place] [phone]   [facility]/Dr. Osei-Bonsu  [address], [place] or [address], [place] [phone] Phone number for both [place] and [place] locations is the same.  [facility] [address], [place] [phone]   [facility] [address] [place] or [address] [phone]   [facility] [address], [place] [phone], phone; [phone], fax Sees patients 1st and 3rd Saturday of every month.  Must not qualify for public or private insurance (i.e. Medicaid, Medicare, [place] Health Choice, Veterans' Benefits)  Household income should be no more than 200% of the poverty level The clinic cannot treat you if you are pregnant or think you are pregnant  Sexually transmitted diseases are not treated at the clinic.    Dental Care: Organization         Address  Phone  Notes  [facility] Department of [facility] [address] [place], [place] [phone] Accepts children up to age 32 who are enrolled in [place] or [place] Health Choice; pregnant women with a Medicaid card; and children who have applied for Medicaid or [place] Health Choice, but were declined, whose parents can pay a reduced fee at time of service.  [facility] Department of [facility]  [address], [place] [phone] Accepts children up to age 38 who are enrolled in [place] or [place] Health Choice; pregnant women with a Medicaid card; and children who have applied for Medicaid or [place] Health Choice, but were declined, whose parents can  pay a reduced fee at time of service.  [facility]  [address] [place], [place] [phone] Patients are seen by appointment only. Walk-ins are not accepted. [facility] will see patients 19 years of age and older. Monday - Tuesday (8am-5pm) Most Wednesdays (8:30-5pm) $30 per visit, cash only  [facility] Adult Dental Access PROGRAM  [address], [facility] [phone] Patients are seen by appointment only. Walk-ins are not accepted. [facility] will see patients 22 years of age and older. One Wednesday Evening (Monthly: Volunteer Based).  $30 per visit, cash only  [employer]  [phone] for adults; Children under age 49, call Graduate Pediatric Dentistry at [phone]. Children aged 58-14, please call [phone] to request a pediatric application.  Dental services are provided in all areas of dental care including fillings, crowns and bridges, complete and partial dentures, implants, gum treatment, root canals, and extractions. Preventive care is also provided. Treatment is provided to both adults and children. Patients are selected via a lottery and there is often a waiting list.   [facility] [address] [place]  [phone] [email or web address]   [facility] [address] [place], [place] [phone], Ext. [phone] Second and Fourth Thursday of each month, opens at 6:30 AM; Clinic ends at 9 AM.  Patients are seen on a first-come first-served basis, and a limited number are seen during each clinic.   [facility]  [address] Ether Griffins [place], [place] [phone]   Eligibility Requirements You must have lived in [place], [place], or [place] counties for at least the last three months.   You cannot be eligible for state or  federal sponsored healthcare insurance, including [employer], [place], or [employer].   You generally cannot be eligible for healthcare  insurance through your employer.    How to apply: Eligibility screenings are held every Tuesday and Wednesday afternoon from 1:00 pm until 4:00 pm. You do not need an appointment for the interview!  [facility] 2Teacher, [place] [phone]   [facility] Health Department  [phone]   [facility] Health Department  [phone]   [facility] Health Department  [phone]    Behavioral Health Resources in the Community: Intensive Outpatient Programs Organization         Address  Phone  Notes  [facility] Services [address], [place], [place] [phone]   [facility] Outpatient [address] [place], [place] [phone]   [facility] [address] [place], [place]  [phone]   [facility] Mental Health [address]  [place], [place] [phone] or [phone]   Substance Abuse Resources Organization         Address  Phone  Notes  [facility]  [phone]   [facility]  [phone]   [facility]  [phone]   Floydene Flock  [phone]   Residential & Outpatient Substance Abuse Program  [phone]   Psychological Services Organization         Address  Phone  Notes  [facility] Behavioral Health  [phone]   [facility] Services  [phone]   [facility] Mental Health [address], [place] [phone] or [phone]    Mobile Crisis Teams Organization         Address  Phone  Notes  [facility], Mobile Crisis Care Unit  [phone]   [facility]  [address] [place], [place] [phone]   Doristine Locks [address] [place] [phone]    Self-Help/Support Groups Organization         Address  Phone             Notes  [facility] [place] - variety of support groups  336- I7437963 Call for more information  Narcotics Anonymous (NA),  [facility] [address], [employer]   2 meetings at this location   Statistician         Address  Phone  Notes  [facility] [address],    [place]  [phone]   [facility]  [address], [place] [address], [place], [place] [phone]   [facility] [address] [place], [place] [phone] Admissions: 8am-3pm M-F  [facility] [address],    [place], [place] [phone]   [facility] [address] [place], [place] [phone]   [facility] [address],  [place], [place] [phone]   [facility] - Intensive Outpatient [address]ure[address] Joseph[address], [place], [place] [phone]   [facility] ([facility]iction [facility] [address] [place]  [place], [place] [phone] or [phone]   [facility] ([facility]) [address], [place], [place] [phone] Accepts Medicaid  [facility] [place] [address],  [place] [phone] Substance Abuse/Addiction Treatment   [facility] Organization         Address  Phone  Notes  [facility]  [phone]   Angie Fava, PhD [address] [place], [place]   (  [phone] [phone] or [phone]) [phone]   [facility]   [address] [place], [place] [phone]   [facility] Recovery [address], [place], [place] [phone] Insurance/Medicaid/sponsorship through [facility] and Families [address]                                    [place], [place] [phone] Therapy/tele-psych/case  [facility] [address]   [place], [place] [phone]    Dr. Lolly Mustache  [phone]   [facility]  [employer] [facility] Dept. 1) [address] [place] 2) [address] [place] 3)  [address], [place] [phone]  [phone]    [facility] Child Abuse Hotline 631-853-4830 or [phone] (After Hours)

## 2015-07-04 NOTE — ED Notes (Signed)
Pelvic Ready At Bedside

## 2015-07-04 NOTE — ED Notes (Signed)
Pt c/o lower abdominal pain. She reports her sexual partner has been having penile discharge. Denies urinary symptoms but reports vaginal discharge and odor.

## 2015-07-05 LAB — GC/CHLAMYDIA PROBE AMP (~~LOC~~) NOT AT ARMC
CHLAMYDIA, DNA PROBE: NEGATIVE
NEISSERIA GONORRHEA: NEGATIVE

## 2016-04-05 DIAGNOSIS — X58XXXA Exposure to other specified factors, initial encounter: Secondary | ICD-10-CM | POA: Insufficient documentation

## 2016-04-05 DIAGNOSIS — J45909 Unspecified asthma, uncomplicated: Secondary | ICD-10-CM | POA: Diagnosis not present

## 2016-04-05 DIAGNOSIS — T192XXA Foreign body in vulva and vagina, initial encounter: Secondary | ICD-10-CM | POA: Diagnosis not present

## 2016-04-05 DIAGNOSIS — Y939 Activity, unspecified: Secondary | ICD-10-CM | POA: Insufficient documentation

## 2016-04-05 DIAGNOSIS — Z79899 Other long term (current) drug therapy: Secondary | ICD-10-CM | POA: Diagnosis not present

## 2016-04-05 DIAGNOSIS — Z791 Long term (current) use of non-steroidal anti-inflammatories (NSAID): Secondary | ICD-10-CM | POA: Diagnosis not present

## 2016-04-05 DIAGNOSIS — Z87891 Personal history of nicotine dependence: Secondary | ICD-10-CM | POA: Diagnosis not present

## 2016-04-05 DIAGNOSIS — Y929 Unspecified place or not applicable: Secondary | ICD-10-CM | POA: Insufficient documentation

## 2016-04-05 DIAGNOSIS — Z5321 Procedure and treatment not carried out due to patient leaving prior to being seen by health care provider: Secondary | ICD-10-CM | POA: Diagnosis not present

## 2016-04-05 DIAGNOSIS — Y999 Unspecified external cause status: Secondary | ICD-10-CM | POA: Insufficient documentation

## 2016-04-06 ENCOUNTER — Encounter (HOSPITAL_COMMUNITY): Payer: Self-pay | Admitting: Emergency Medicine

## 2016-04-06 ENCOUNTER — Emergency Department (HOSPITAL_COMMUNITY)
Admission: EM | Admit: 2016-04-06 | Discharge: 2016-04-06 | Disposition: A | Discharge status: Home / Self Care | Payer: Medicaid Other | Attending: Dermatology | Admitting: Dermatology

## 2016-04-06 NOTE — ED Triage Notes (Signed)
Pt states that two days ago she placed a make-up sponge in her vagina to prevent vaginal bleeding while she was in the bath tub. Unsure if it ever came out but shes starting to have abdominal pain. Alert and oriented.

## 2016-04-06 NOTE — ED Notes (Signed)
Pt not present when called for room

## 2016-04-07 ENCOUNTER — Emergency Department (HOSPITAL_COMMUNITY)
Admission: EM | Admit: 2016-04-07 | Discharge: 2016-04-08 | Disposition: A | Discharge status: Home / Self Care | Payer: Medicaid Other | Attending: Emergency Medicine | Admitting: Emergency Medicine

## 2016-04-07 ENCOUNTER — Encounter (HOSPITAL_COMMUNITY): Payer: Self-pay | Admitting: *Deleted

## 2016-04-07 DIAGNOSIS — R109 Unspecified abdominal pain: Secondary | ICD-10-CM | POA: Insufficient documentation

## 2016-04-07 DIAGNOSIS — Y929 Unspecified place or not applicable: Secondary | ICD-10-CM | POA: Insufficient documentation

## 2016-04-07 DIAGNOSIS — K5900 Constipation, unspecified: Secondary | ICD-10-CM | POA: Insufficient documentation

## 2016-04-07 DIAGNOSIS — X58XXXA Exposure to other specified factors, initial encounter: Secondary | ICD-10-CM | POA: Insufficient documentation

## 2016-04-07 DIAGNOSIS — T192XXA Foreign body in vulva and vagina, initial encounter: Secondary | ICD-10-CM | POA: Diagnosis not present

## 2016-04-07 DIAGNOSIS — J45909 Unspecified asthma, uncomplicated: Secondary | ICD-10-CM | POA: Insufficient documentation

## 2016-04-07 DIAGNOSIS — Y939 Activity, unspecified: Secondary | ICD-10-CM | POA: Insufficient documentation

## 2016-04-07 DIAGNOSIS — Z79899 Other long term (current) drug therapy: Secondary | ICD-10-CM | POA: Insufficient documentation

## 2016-04-07 DIAGNOSIS — N898 Other specified noninflammatory disorders of vagina: Secondary | ICD-10-CM | POA: Diagnosis present

## 2016-04-07 DIAGNOSIS — N760 Acute vaginitis: Secondary | ICD-10-CM | POA: Insufficient documentation

## 2016-04-07 DIAGNOSIS — Z87891 Personal history of nicotine dependence: Secondary | ICD-10-CM | POA: Insufficient documentation

## 2016-04-07 DIAGNOSIS — Y999 Unspecified external cause status: Secondary | ICD-10-CM | POA: Diagnosis not present

## 2016-04-07 LAB — COMPREHENSIVE METABOLIC PANEL
ALBUMIN: 4.8 g/dL (ref 3.5–5.0)
ALK PHOS: 56 U/L (ref 38–126)
ALT: 19 U/L (ref 14–54)
AST: 21 U/L (ref 15–41)
Anion gap: 8 (ref 5–15)
BILIRUBIN TOTAL: 0.9 mg/dL (ref 0.3–1.2)
BUN: 10 mg/dL (ref 6–20)
CO2: 28 mmol/L (ref 22–32)
Calcium: 9.7 mg/dL (ref 8.9–10.3)
Chloride: 104 mmol/L (ref 101–111)
Creatinine, Ser: 0.8 mg/dL (ref 0.44–1.00)
GFR calc Af Amer: >60 mL/min (ref >60)
GFR calc non Af Amer: >60 mL/min (ref >60)
GLUCOSE: 80 mg/dL (ref 65–99)
POTASSIUM: 3.8 mmol/L (ref 3.5–5.1)
SODIUM: 140 mmol/L (ref 135–145)
TOTAL PROTEIN: 8.6 g/dL — AB (ref 6.5–8.1)

## 2016-04-07 LAB — URINALYSIS, ROUTINE W REFLEX MICROSCOPIC
BILIRUBIN URINE: NEGATIVE
GLUCOSE, UA: NEGATIVE mg/dL
HGB URINE DIPSTICK: NEGATIVE
KETONES UR: NEGATIVE mg/dL
Leukocytes, UA: NEGATIVE
NITRITE: NEGATIVE
PH: 5.5 (ref 5.0–8.0)
Protein, ur: NEGATIVE mg/dL
SPECIFIC GRAVITY, URINE: 1.014 (ref 1.005–1.030)

## 2016-04-07 LAB — WET PREP, GENITAL
SPERM: NONE SEEN
TRICH WET PREP: NONE SEEN
WBC, Wet Prep HPF POC: NONE SEEN
Yeast Wet Prep HPF POC: NONE SEEN

## 2016-04-07 LAB — CBC
HEMATOCRIT: 37.5 % (ref 36.0–46.0)
HEMOGLOBIN: 13.3 g/dL (ref 12.0–15.0)
MCH: 32.4 pg (ref 26.0–34.0)
MCHC: 35.5 g/dL (ref 30.0–36.0)
MCV: 91.2 fL (ref 78.0–100.0)
Platelets: 239 10*3/uL (ref 150–400)
RBC: 4.11 MIL/uL (ref 3.87–5.11)
RDW: 13 % (ref 11.5–15.5)
WBC: 8.8 10*3/uL (ref 4.0–10.5)

## 2016-04-07 LAB — LIPASE, BLOOD: Lipase: 25 U/L (ref 11–51)

## 2016-04-07 MED ORDER — METRONIDAZOLE 0.75 % VA GEL: 1.0000 | Freq: Two times a day (BID) | VAGINAL | 0 refills | Status: DC

## 2016-04-07 NOTE — ED Notes (Signed)
Provider in room with female tech

## 2016-04-07 NOTE — ED Provider Notes (Signed)
[facility]-EMERGENCY DEPT Provider Note   CSN: [id number] Arrival date & time: 04/07/16  1905 By signing my name below, I, Levon [id number] Hall, attest that this documentation has been prepared under the direction and in the presence of non-physician practitioner, Arnoldo HookerShari A Jamel Holzmann PA-C Electronically Signed: Levon HedgerElizabeth Hall, Scribe. 04/07/2016. 10:44 PM.   History   Chief Complaint Chief Complaint  Patient presents with  . Abdominal Pain    HPI Jill Reed is a 25 y.o. female who presents to the Emergency Department complaining of left sided abdominal pan. Pain is waxing and waning and pressure-like. She notes associated difficulty urinating, constipation and vaginal discharge. Last bowel movement was yesterday, but pt states she feels constipated and like she "needs to push". Per pt, she used a clean make up sponge in her vagina as a tampon replacement three days ago. She forgot about the sponge, fell asleep, and then was unable to retrieve it. No abdominal SHx. Pt states she is otherwise in her normal health. Pt denies any abdominal distention, nausea, vomiting, or fever The history is provided by the patient. No language interpreter was used.    Past Medical History:  Diagnosis Date  . Anxiety   . Asthma   . Depression   . Irregular heart beat   . Sickle cell trait (HCC)     There are no active problems to display for this patient.   Past Surgical History:  Procedure Laterality Date  . NO PAST SURGERIES      OB History    Gravida Para Term Preterm AB Living   2 1 0 1 0 1   SAB TAB Ectopic Multiple Live Births   0 0 0 0 1      Home Medications    Prior to Admission medications   Medication Sig Start Date End Date Taking? Authorizing Provider  Diphenhydramine-APAP, sleep, (TYLENOL PM EXTRA STRENGTH) 50-1000 MG/30ML LIQD Take 10 mLs by mouth at bedtime as needed (pain).    Historical Provider, MD  metroNIDAZOLE (FLAGYL) 500 MG tablet Take 1 tablet (500 mg total) by mouth 2  (two) times daily. 07/04/15   Mady GemmaElizabeth C Westfall, PA-C  naproxen (NAPROSYN) 500 MG tablet Take 1 tablet (500 mg total) by mouth 2 (two) times daily. Patient not taking: Reported on 04/07/2016 05/12/15   Janne NapoleonHope M Neese, NP    Family History Family History  Problem Relation Age of Onset  . Sickle cell anemia Father     Social History Social History  Substance Use Topics  . Smoking status: Former Smoker    Types: Cigarettes    Quit date: 10/17/2011  . Smokeless tobacco: Never Used  . Alcohol use No     Allergies   Carrot [daucus carota]; Mushroom extract complex; and Peach [prunus persica]   Review of Systems Review of Systems  Constitutional: Negative for fever.  Gastrointestinal: Positive for abdominal pain. Negative for abdominal distention, nausea and vomiting.  Genitourinary: Positive for difficulty urinating and vaginal discharge.   Physical Exam Updated Vital Signs BP 114/76 (BP Location: Left Arm)   Pulse 74   Temp 99.2 F (37.3 C) (Oral)   Ht 5\' 3"  (1.6 m)   Wt 150 lb (68 kg)   LMP 03/30/2016   SpO2 100%   BMI 26.57 kg/m   Physical Exam  Constitutional: She is oriented to person, place, and time. She appears well-developed and well-nourished. No distress.  HENT:  Head: Normocephalic and atraumatic.  Eyes: Conjunctivae are normal.  Cardiovascular: Normal rate.  Pulmonary/Chest: Effort normal.  Abdominal: She exhibits no distension. There is no tenderness. There is no guarding.  Genitourinary: Vaginal discharge found.  Genitourinary Comments: Retained foreign body in fornix with associated green discharge, removed without difficulty. No CMT. No cervical discharge present. No adnexal mass or tenderness.   Neurological: She is alert and oriented to person, place, and time.  Skin: Skin is warm and dry.  Psychiatric: She has a normal mood and affect.  Nursing note and vitals reviewed.  ED Treatments / Results  DIAGNOSTIC STUDIES:  Oxygen Saturation is 100%  on RA, normal by my interpretation.    COORDINATION OF CARE:  10:38 PM Discussed treatment plan with pt at bedside and pt agreed to plan.  Labs (all labs ordered are listed, but only abnormal results are displayed) Labs Reviewed  COMPREHENSIVE METABOLIC PANEL - Abnormal; Notable for the following:       Result Value   Total Protein 8.6 (*)    All other components within normal limits  WET PREP, GENITAL  LIPASE, BLOOD  CBC  URINALYSIS, ROUTINE W REFLEX MICROSCOPIC (NOT AT [facility])  GC/CHLAMYDIA PROBE AMP ([facility]) NOT AT [facility]    EKG  EKG Interpretation None       Radiology No results found.  Procedures Procedures (including critical care time)  Medications Ordered in ED Medications - No data to display   Initial Impression / Assessment and Plan / ED Course  I have reviewed the triage vital signs and the nursing notes.  Pertinent labs & imaging results that were available during my care of the patient were reviewed by me and considered in my medical decision making (see chart for details).  Clinical Course    Patient with retained vaginal FB, removed. No evidence pelvic infection. She has clue cells on wet prep - will provide Metrogel x 7 days.  The patient describes LUQ waxing and waning pain, "feels like gas", unsure of last bowel movement. Abdomen nontender. +BS. Labs unremarkable. Feel constipation is likely. Recommend Miralax and PCP follow up if pain is no better.   Final Clinical Impressions(s) / ED Diagnoses   Final diagnoses:  None  1. Vaginal Foreign Body, removed 2. Constipation  I personally performed the services described in this documentation, which was scribed in my presence. The recorded information has been reviewed and is accurate.     New Prescriptions New Prescriptions   No medications on file     Elpidio Anis, PA-C 04/08/16 0004    Benjiman Core, MD 04/08/16 [phone]

## 2016-04-07 NOTE — ED Triage Notes (Addendum)
Pt states she put a make up sponge in her vagina 3 days ago. She ran out of tampons. Pt c/o abd pain now primarily left upper qaudrant.Pt now has a foul odor when she voids. No vaginal discharge. No fevers but chills. Positive abd cramping.  Blood work and urine obtained (7:55pm)

## 2016-04-07 NOTE — ED Notes (Signed)
Provider in room  

## 2016-04-10 LAB — GC/CHLAMYDIA PROBE AMP (~~LOC~~) NOT AT ARMC
Chlamydia: NEGATIVE
Neisseria Gonorrhea: NEGATIVE

## 2016-07-18 ENCOUNTER — Encounter (HOSPITAL_COMMUNITY): Payer: Self-pay | Admitting: *Deleted

## 2016-07-18 ENCOUNTER — Emergency Department (HOSPITAL_COMMUNITY)
Admission: EM | Admit: 2016-07-18 | Discharge: 2016-07-18 | Disposition: A | Discharge status: Home / Self Care | Payer: Medicaid Other | Attending: Emergency Medicine | Admitting: Emergency Medicine

## 2016-07-18 DIAGNOSIS — M7989 Other specified soft tissue disorders: Secondary | ICD-10-CM | POA: Diagnosis present

## 2016-07-18 DIAGNOSIS — J45909 Unspecified asthma, uncomplicated: Secondary | ICD-10-CM | POA: Insufficient documentation

## 2016-07-18 DIAGNOSIS — Z79899 Other long term (current) drug therapy: Secondary | ICD-10-CM | POA: Diagnosis not present

## 2016-07-18 DIAGNOSIS — Z87891 Personal history of nicotine dependence: Secondary | ICD-10-CM | POA: Diagnosis not present

## 2016-07-18 DIAGNOSIS — L03114 Cellulitis of left upper limb: Secondary | ICD-10-CM

## 2016-07-18 MED ORDER — IBUPROFEN 400 MG PO TABS
600.0000 mg | ORAL_TABLET | Freq: Once | ORAL | Status: AC
  Administered 2016-07-18: 600 mg via ORAL
  Filled 2016-07-18: qty 1

## 2016-07-18 MED ORDER — NAPROXEN 500 MG PO TABS: 500.0000 mg | ORAL_TABLET | Freq: Two times a day (BID) | ORAL | 0 refills | Status: DC

## 2016-07-18 MED ORDER — CEPHALEXIN 500 MG PO CAPS: 500.0000 mg | ORAL_CAPSULE | Freq: Four times a day (QID) | ORAL | 0 refills | Status: DC

## 2016-07-18 MED ORDER — CEPHALEXIN 250 MG PO CAPS
500.0000 mg | ORAL_CAPSULE | Freq: Once | ORAL | Status: AC
  Administered 2016-07-18: 500 mg via ORAL
  Filled 2016-07-18: qty 2

## 2016-07-18 NOTE — ED Notes (Addendum)
Patient called x 2 in all waiting areas with no answer

## 2016-07-18 NOTE — ED Provider Notes (Signed)
[facility]-EMERGENCY DEPT Provider Note   CSN: [id number] Arrival date & time: 07/18/16  1745  By signing my name below, I, Jill Reed, attest that this documentation has been prepared under the direction and in the presence of Jill Morn, NP. Electronically Signed: Rosario Reed, ED Scribe. 07/18/16. 8:02 PM.  History   Chief Complaint Chief Complaint  Patient presents with  . Arm Pain  . Arm Swelling   The history is provided by the patient. No language interpreter was used.  Arm Pain  This is a new problem. The current episode started 6 to 12 hours ago. The problem occurs constantly. The problem has been gradually worsening. Nothing aggravates the symptoms. Nothing relieves the symptoms. She has tried nothing for the symptoms. The treatment provided no relief.    HPI Comments: Jill Reed is a 26 y.o. female with a h/o asthma, who presents to the Emergency Department complaining of gradually worsening, constant left distal forearm pain which began earlier this morning. She reports associated swelling to her area of pain, and a small wound to the area which she noticed earlier this evening. Pt denies any known insect, arachnid, or tick bites to precipitate this area. No treatments for her symptoms were tried prior to coming into the ED. She denies any fever, chills, drainage from the area, or any other associated symptoms.   Past Medical History:  Diagnosis Date  . Anxiety   . Asthma   . Depression   . Irregular heart beat   . Sickle cell trait (HCC)    There are no active problems to display for this patient.  Past Surgical History:  Procedure Laterality Date  . NO PAST SURGERIES     OB History    Gravida Para Term Preterm AB Living   2 1 0 1 0 1   SAB TAB Ectopic Multiple Live Births   0 0 0 0 1     Home Medications    Prior to Admission medications   Medication Sig Start Date End Date Taking? Authorizing Provider  Diphenhydramine-APAP, sleep,  (TYLENOL PM EXTRA STRENGTH) 50-1000 MG/30ML LIQD Take 10 mLs by mouth at bedtime as needed (pain).    Historical Provider, MD  metroNIDAZOLE (FLAGYL) 500 MG tablet Take 1 tablet (500 mg total) by mouth 2 (two) times daily. Patient not taking: Reported on 04/07/2016 07/04/15   Mady Gemma, PA-C  metroNIDAZOLE (METROGEL VAGINAL) 0.75 % vaginal gel Place 1 Applicatorful vaginally 2 (two) times daily. 04/07/16   Elpidio Anis, PA-C  naproxen (NAPROSYN) 500 MG tablet Take 1 tablet (500 mg total) by mouth 2 (two) times daily. Patient not taking: Reported on 04/07/2016 05/12/15   Janne Napoleon, NP   Family History Family History  Problem Relation Age of Onset  . Sickle cell anemia Father    Social History Social History  Substance Use Topics  . Smoking status: Former Smoker    Types: Cigarettes    Quit date: 10/17/2011  . Smokeless tobacco: Never Used  . Alcohol use No   Allergies   Carrot [daucus carota]; Mushroom extract complex; and Peach [prunus persica]  Review of Systems Review of Systems  Constitutional: Negative for chills and fever.  Musculoskeletal: Positive for myalgias.  Skin: Positive for wound.  All other systems reviewed and are negative.  Physical Exam Updated Vital Signs Ht 5\' 5"  (1.651 m)   Wt 141 lb (64 kg)   LMP 07/18/2016   BMI 23.46 kg/m   Physical Exam  Constitutional: She appears well-developed and well-nourished. No distress.  HENT:  Head: Normocephalic and atraumatic.  Eyes: Conjunctivae are normal.  Neck: Normal range of motion.  Cardiovascular: Normal rate, regular rhythm and normal heart sounds.   Pulmonary/Chest: Effort normal and breath sounds normal. No respiratory distress. She has no wheezes. She has no rales.  Abdominal: She exhibits no distension.  Musculoskeletal: She exhibits edema.  3x5 area of swelling to the left forearm.   Neurological: She is alert.  Skin: No pallor.  Psychiatric: She has a normal mood and affect. Her behavior  is normal.  Nursing note and vitals reviewed.      ED Treatments / Results   COORDINATION OF CARE: 7:59 PM-Discussed next steps with pt. Pt verbalized understanding and is agreeable with the plan.   Labs (all labs ordered are listed, but only abnormal results are displayed) Labs Reviewed - No data to display  EKG  EKG Interpretation None      Radiology No results found.  Procedures Procedures   Medications Ordered in ED Medications - No data to display  Initial Impression / Assessment and Plan / ED Course  I have reviewed the triage vital signs and the nursing notes.  Pertinent labs & imaging results that were available during my care of the patient were reviewed by me and considered in my medical decision making (see chart for details).  Clinical Course    Patient presentation consistent with cellulitis. Afebrile. No tachycardia, hypotension or other symptoms suggestive of severe infection. Area has been demarcated and pt advised to follow up for wound check in 2-3 days, sooner for worsening systemic symptoms, new lymphangitis, or significant spread of erythema past line of demarcation. Will discharge with keflex. Return precautions discussed. Pt appears safe for discharge.   Final Clinical Impressions(s) / ED Diagnoses   Final diagnoses:  Cellulitis of left upper extremity   New Prescriptions Discharge Medication List as of 07/18/2016  8:56 PM    START taking these medications   Details  cephALEXin (KEFLEX) 500 MG capsule Take 1 capsule (500 mg total) by mouth 4 (four) times daily., Starting Tue 07/18/2016, Print       I personally performed the services described in this documentation, which was scribed in my presence. The recorded information has been reviewed and is accurate.     Jill Mornavid Yazen Rosko, NP 07/19/16 [id number]    Geoffery Lyonsouglas Delo, MD 07/19/16 2154

## 2016-07-18 NOTE — ED Notes (Signed)
See PA note - one touch

## 2016-07-18 NOTE — ED Triage Notes (Signed)
Pt in c/o L arm swelling & L arm pain onset today, pt states, "I don't know if a spider bit me or what but my arm is swollen & hurts, no drainage noted, no obvious bite marks, pts arm swollen, MAE, A&O x4

## 2016-07-18 NOTE — Discharge Instructions (Signed)
Return for recheck in 48-72 hours. You may come to the ED or visit the urgent care centers listed.

## 2016-07-26 ENCOUNTER — Emergency Department (HOSPITAL_COMMUNITY)
Admission: EM | Admit: 2016-07-26 | Discharge: 2016-07-26 | Disposition: A | Discharge status: Home / Self Care | Payer: Medicaid Other | Attending: Emergency Medicine | Admitting: Emergency Medicine

## 2016-07-26 ENCOUNTER — Encounter (HOSPITAL_COMMUNITY): Payer: Self-pay | Admitting: Emergency Medicine

## 2016-07-26 ENCOUNTER — Emergency Department (HOSPITAL_COMMUNITY): Payer: Medicaid Other

## 2016-07-26 DIAGNOSIS — Y999 Unspecified external cause status: Secondary | ICD-10-CM | POA: Insufficient documentation

## 2016-07-26 DIAGNOSIS — S199XXA Unspecified injury of neck, initial encounter: Secondary | ICD-10-CM | POA: Diagnosis present

## 2016-07-26 DIAGNOSIS — S161XXA Strain of muscle, fascia and tendon at neck level, initial encounter: Secondary | ICD-10-CM | POA: Diagnosis not present

## 2016-07-26 DIAGNOSIS — Z87891 Personal history of nicotine dependence: Secondary | ICD-10-CM | POA: Diagnosis not present

## 2016-07-26 DIAGNOSIS — Z79899 Other long term (current) drug therapy: Secondary | ICD-10-CM | POA: Diagnosis not present

## 2016-07-26 DIAGNOSIS — S73102A Unspecified sprain of left hip, initial encounter: Secondary | ICD-10-CM

## 2016-07-26 DIAGNOSIS — J45909 Unspecified asthma, uncomplicated: Secondary | ICD-10-CM | POA: Diagnosis not present

## 2016-07-26 DIAGNOSIS — Y939 Activity, unspecified: Secondary | ICD-10-CM | POA: Insufficient documentation

## 2016-07-26 DIAGNOSIS — Y92410 Unspecified street and highway as the place of occurrence of the external cause: Secondary | ICD-10-CM | POA: Insufficient documentation

## 2016-07-26 LAB — PREGNANCY, URINE: Preg Test, Ur: NEGATIVE

## 2016-07-26 MED ORDER — CYCLOBENZAPRINE HCL 10 MG PO TABS: 10.0000 mg | ORAL_TABLET | Freq: Two times a day (BID) | ORAL | 0 refills | Status: DC | PRN

## 2016-07-26 MED ORDER — IBUPROFEN 800 MG PO TABS
800.0000 mg | ORAL_TABLET | Freq: Once | ORAL | Status: AC
  Administered 2016-07-26: 800 mg via ORAL
  Filled 2016-07-26: qty 1

## 2016-07-26 MED ORDER — HYDROCODONE-ACETAMINOPHEN 5-325 MG PO TABS
1.0000 | ORAL_TABLET | Freq: Once | ORAL | Status: AC
  Administered 2016-07-26: 1 via ORAL
  Filled 2016-07-26: qty 1

## 2016-07-26 NOTE — ED Notes (Signed)
Provider at bedside
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
## 2016-07-26 NOTE — ED Notes (Signed)
Patient transported to X-ray 

## 2016-07-26 NOTE — ED Notes (Signed)
Patient returned from X-ray 

## 2016-08-19 ENCOUNTER — Emergency Department (HOSPITAL_COMMUNITY)
Admission: EM | Admit: 2016-08-19 | Discharge: 2016-08-19 | Disposition: A | Discharge status: Home / Self Care | Payer: Medicaid Other | Attending: Emergency Medicine | Admitting: Emergency Medicine

## 2016-08-19 ENCOUNTER — Encounter (HOSPITAL_COMMUNITY): Payer: Self-pay | Admitting: *Deleted

## 2016-08-19 DIAGNOSIS — J45909 Unspecified asthma, uncomplicated: Secondary | ICD-10-CM | POA: Insufficient documentation

## 2016-08-19 DIAGNOSIS — Z87891 Personal history of nicotine dependence: Secondary | ICD-10-CM | POA: Insufficient documentation

## 2016-08-19 DIAGNOSIS — N764 Abscess of vulva: Secondary | ICD-10-CM | POA: Insufficient documentation

## 2016-08-19 DIAGNOSIS — R102 Pelvic and perineal pain: Secondary | ICD-10-CM | POA: Diagnosis present

## 2016-08-19 LAB — URINALYSIS, ROUTINE W REFLEX MICROSCOPIC
BILIRUBIN URINE: NEGATIVE
GLUCOSE, UA: NEGATIVE mg/dL
HGB URINE DIPSTICK: NEGATIVE
KETONES UR: NEGATIVE mg/dL
Leukocytes, UA: NEGATIVE
Nitrite: NEGATIVE
PROTEIN: NEGATIVE mg/dL
Specific Gravity, Urine: 1.012 (ref 1.005–1.030)
pH: 7 (ref 5.0–8.0)

## 2016-08-19 LAB — POC URINE PREG, ED: PREG TEST UR: NEGATIVE

## 2016-08-19 MED ORDER — IBUPROFEN 800 MG PO TABS: 800.0000 mg | ORAL_TABLET | Freq: Three times a day (TID) | ORAL | 0 refills | Status: DC

## 2016-08-19 MED ORDER — LIDOCAINE-EPINEPHRINE (PF) 1 %-1:200000 IJ SOLN
10.0000 mL | Freq: Once | INTRAMUSCULAR | Status: AC
  Administered 2016-08-19: 10 mL
  Filled 2016-08-19: qty 30

## 2016-08-19 MED ORDER — LIDOCAINE-EPINEPHRINE 1 %-1:100000 IJ SOLN
10.0000 mL | Freq: Once | INTRAMUSCULAR | Status: DC
  Filled 2016-08-19: qty 10

## 2016-08-19 MED ORDER — SULFAMETHOXAZOLE-TRIMETHOPRIM 800-160 MG PO TABS: 1.0000 | ORAL_TABLET | Freq: Two times a day (BID) | ORAL | 0 refills | Status: AC

## 2016-08-19 MED ORDER — IBUPROFEN 800 MG PO TABS
800.0000 mg | ORAL_TABLET | Freq: Once | ORAL | Status: AC
  Administered 2016-08-19: 800 mg via ORAL
  Filled 2016-08-19: qty 1

## 2016-08-19 MED ORDER — IBUPROFEN 800 MG PO TABS: 800.0000 mg | ORAL_TABLET | Freq: Once | ORAL | Status: DC

## 2016-08-19 NOTE — Discharge Instructions (Signed)

## 2016-08-19 NOTE — ED Provider Notes (Signed)
[facility] DEPT Provider Note   CSN: [id number] Arrival date & time: 08/19/16  1311     History   Chief Complaint Chief Complaint  Patient presents with  . Groin Swelling    HPI Jill Reed is a 26 y.o. female.  HPI   26 year old female with approximately 4 days of gradual onset pain in the right labial fold, this is gradually worsening, worse with palpation, not associated with fevers. No treatment before arrival, no difficulty with urination abdominal pain or fevers or vomiting  Past Medical History:  Diagnosis Date  . Anxiety   . Asthma   . Depression   . Irregular heart beat   . Sickle cell trait (HCC)     There are no active problems to display for this patient.   Past Surgical History:  Procedure Laterality Date  . NO PAST SURGERIES      OB History    Gravida Para Term Preterm AB Living   2 1 0 1 0 1   SAB TAB Ectopic Multiple Live Births   0 0 0 0 1       Home Medications    Prior to Admission medications   Medication Sig Start Date End Date Taking? Authorizing Provider  cephALEXin (KEFLEX) 500 MG capsule Take 1 capsule (500 mg total) by mouth 4 (four) times daily. Patient not taking: Reported on 08/19/2016 07/18/16   [name], NP  cyclobenzaprine (FLEXERIL) 10 MG tablet Take 1 tablet (10 mg total) by mouth 2 (two) times daily as needed for muscle spasms. Patient not taking: Reported on 08/19/2016 07/26/16   Zadie Rhine, MD  ibuprofen (ADVIL,MOTRIN) 800 MG tablet Take 1 tablet (800 mg total) by mouth 3 (three) times daily. 08/19/16   Eber Hong, MD  metroNIDAZOLE (FLAGYL) 500 MG tablet Take 1 tablet (500 mg total) by mouth 2 (two) times daily. Patient not taking: Reported on 04/07/2016 07/04/15   Mady Gemma, PA-C  metroNIDAZOLE (METROGEL VAGINAL) 0.75 % vaginal gel Place 1 Applicatorful vaginally 2 (two) times daily. Patient not taking: Reported on 08/19/2016 04/07/16   Elpidio Anis, PA-C  naproxen (NAPROSYN) 500 MG tablet Take 1  tablet (500 mg total) by mouth 2 (two) times daily. Patient not taking: Reported on 08/19/2016 07/18/16   [name], NP  sulfamethoxazole-trimethoprim (BACTRIM DS,SEPTRA DS) 800-160 MG tablet Take 1 tablet by mouth 2 (two) times daily. 08/19/16 08/26/16  Eber Hong, MD    Family History Family History  Problem Relation Age of Onset  . Sickle cell anemia Father     Social History Social History  Substance Use Topics  . Smoking status: Former Smoker    Types: Cigarettes    Quit date: 10/17/2011  . Smokeless tobacco: Never Used  . Alcohol use No     Allergies   Carrot [daucus carota]; Mushroom extract complex; and Peach [prunus persica]   Review of Systems Review of Systems  Constitutional: Negative for fever.  Gastrointestinal: Negative for abdominal pain.  Genitourinary: Positive for vaginal pain. Negative for dysuria.     Physical Exam Updated Vital Signs BP 117/75   Pulse 75   Temp 98.7 F (37.1 C)   Resp 16   Ht 5\' 2"  (1.575 m)   Wt 136 lb (61.7 kg)   LMP 08/16/2016   SpO2 100%   Breastfeeding? No   BMI 24.87 kg/m   Physical Exam  Constitutional: She appears well-developed and well-nourished.  HENT:  Head: Normocephalic and atraumatic.  Eyes: Conjunctivae are normal.  Right eye exhibits no discharge. Left eye exhibits no discharge.  Pulmonary/Chest: Effort normal. No respiratory distress.  Genitourinary:  Genitourinary Comments: Chaperone present for exam, normal-appearing external genitalia except for the right labial full which has a swollen tender, minimally indurated but fluctuant mass standing approximately 2-1/2 cm along the length of the labia majora, no redness, no drainage  Neurological: She is alert. Coordination normal.  Skin: Skin is warm and dry. No rash noted. She is not diaphoretic. No erythema.  Psychiatric: She has a normal mood and affect.  Nursing note and vitals reviewed.    ED Treatments / Results  Labs (all labs ordered are listed,  but only abnormal results are displayed) Labs Reviewed  URINALYSIS, ROUTINE W REFLEX MICROSCOPIC  POC URINE PREG, ED     Radiology No results found.  Procedures Procedures (including critical care time)  Medications Ordered in ED Medications  ibuprofen (ADVIL,MOTRIN) tablet 800 mg (not administered)  ibuprofen (ADVIL,MOTRIN) tablet 800 mg (not administered)  lidocaine-EPINEPHrine (XYLOCAINE-EPINEPHrine) 1 %-1:200000 (PF) injection 10 mL (10 mLs Other Given by Other 08/19/16 1753)     Initial Impression / Assessment and Plan / ED Course  I have reviewed the triage vital signs and the nursing notes.  Pertinent labs & imaging results that were available during my care of the patient were reviewed by me and considered in my medical decision making (see chart for details).    Labial abscess, needs primary drainage, lidocaine, Betadine, incision and ward catheter   With chaperone (RN Deanna ArtisKeisha) present, I and D performed  INCISION AND DRAINAGE Performed by: Vida RollerBrian D Bobbette Eakes Consent: Verbal consent obtained. Risks and benefits: risks, benefits and alternatives were discussed Type: abscess  Body area: R labia majora   Anesthesia: local infiltration  Incision was made with a scalpel.  Local anesthetic: lidocaine 1% with epinephrine  Anesthetic total: 1 ml  Complexity: complex Blunt dissection to break up loculations  Drainage: purulent  Drainage amount: moderate  Packing material: Word catheter  Patient tolerance: Patient tolerated the procedure well with no immediate complications.  F/u in 7-10 days for catheter removal with GYN, Bactrim, mOtrin, pt in agreement.  Final Clinical Impressions(s) / ED Diagnoses   Final diagnoses:  Labial abscess    New Prescriptions New Prescriptions   IBUPROFEN (ADVIL,MOTRIN) 800 MG TABLET    Take 1 tablet (800 mg total) by mouth 3 (three) times daily.   SULFAMETHOXAZOLE-TRIMETHOPRIM (BACTRIM DS,SEPTRA DS) 800-160 MG TABLET     Take 1 tablet by mouth 2 (two) times daily.     Eber HongBrian Candas Deemer, MD 08/19/16 [id number] [id number]

## 2016-08-19 NOTE — ED Triage Notes (Signed)
Pt c/o R  inguinal  & R vaginal swelling and pain onset Weds., pt denies drainage & pain, pt denies skin irritation, pt reports pain on ambulation, A&O x4

## 2016-08-19 NOTE — ED Notes (Signed)
Urine in minilab.

## 2016-08-20 ENCOUNTER — Emergency Department (HOSPITAL_COMMUNITY)
Admission: EM | Admit: 2016-08-20 | Discharge: 2016-08-20 | Disposition: A | Discharge status: Home / Self Care | Payer: Medicaid Other | Attending: Emergency Medicine | Admitting: Emergency Medicine

## 2016-08-20 ENCOUNTER — Encounter (HOSPITAL_COMMUNITY): Payer: Self-pay

## 2016-08-20 DIAGNOSIS — Z4801 Encounter for change or removal of surgical wound dressing: Secondary | ICD-10-CM | POA: Diagnosis not present

## 2016-08-20 DIAGNOSIS — Z87891 Personal history of nicotine dependence: Secondary | ICD-10-CM | POA: Insufficient documentation

## 2016-08-20 DIAGNOSIS — J45909 Unspecified asthma, uncomplicated: Secondary | ICD-10-CM | POA: Diagnosis not present

## 2016-08-20 DIAGNOSIS — Z79899 Other long term (current) drug therapy: Secondary | ICD-10-CM | POA: Insufficient documentation

## 2016-08-20 DIAGNOSIS — Z09 Encounter for follow-up examination after completed treatment for conditions other than malignant neoplasm: Secondary | ICD-10-CM

## 2016-08-20 NOTE — Discharge Instructions (Signed)
Please follow with your primary care doctor in the next 2 days for a check-up. They must obtain records for further management.  ° °Do not hesitate to return to the Emergency Department for any new, worsening or concerning symptoms.  ° °

## 2016-08-20 NOTE — ED Provider Notes (Signed)
[facility] Provider Note   CSN: [id number] Arrival date & time: 08/20/16  1040     History   Chief Complaint Chief Complaint  Patient presents with  . recheck abscess    HPI   Blood pressure 120/70, pulse 76, temperature 98.2 F (36.8 C), temperature source Oral, resp. rate 17, last menstrual period 08/16/2016, SpO2 100 %.  Jill Reed is a 26 y.o. female complaining of perineal irritation status post IND of Bartholin's gland abscess with Ward catheter placement. States she has not had time to pick up her antibiotics, she taking any pain medication. She states that she was no drainage and there continues to be Pain and discomfort. She has tried to tape the catheter out of the way so it stops irritating her  Past Medical History:  Diagnosis Date  . Anxiety   . Asthma   . Depression   . Irregular heart beat   . Sickle cell trait (HCC)     There are no active problems to display for this patient.   Past Surgical History:  Procedure Laterality Date  . NO PAST SURGERIES      OB History    Gravida Para Term Preterm AB Living   2 1 0 1 0 1   SAB TAB Ectopic Multiple Live Births   0 0 0 0 1       Home Medications    Prior to Admission medications   Medication Sig Start Date End Date Taking? Authorizing Provider  cephALEXin (KEFLEX) 500 MG capsule Take 1 capsule (500 mg total) by mouth 4 (four) times daily. Patient not taking: Reported on 08/19/2016 07/18/16   Jill Morn, NP  cyclobenzaprine (FLEXERIL) 10 MG tablet Take 1 tablet (10 mg total) by mouth 2 (two) times daily as needed for muscle spasms. Patient not taking: Reported on 08/19/2016 07/26/16   Jill Rhine, MD  ibuprofen (ADVIL,MOTRIN) 800 MG tablet Take 1 tablet (800 mg total) by mouth 3 (three) times daily. Patient not taking: Reported on 08/20/2016 08/19/16   Jill Hong, MD  metroNIDAZOLE (FLAGYL) 500 MG tablet Take 1 tablet (500 mg total) by mouth 2 (two) times daily. Patient not taking: Reported  on 04/07/2016 07/04/15   Jill Gemma, PA-C  metroNIDAZOLE (METROGEL VAGINAL) 0.75 % vaginal gel Place 1 Applicatorful vaginally 2 (two) times daily. Patient not taking: Reported on 08/19/2016 04/07/16   Jill Anis, PA-C  naproxen (NAPROSYN) 500 MG tablet Take 1 tablet (500 mg total) by mouth 2 (two) times daily. Patient not taking: Reported on 08/19/2016 07/18/16   Jill Morn, NP  sulfamethoxazole-trimethoprim (BACTRIM DS,SEPTRA DS) 800-160 MG tablet Take 1 tablet by mouth 2 (two) times daily. Patient not taking: Reported on 08/20/2016 08/19/16 08/26/16  Jill Hong, MD    Family History Family History  Problem Relation Age of Onset  . Sickle cell anemia Father     Social History Social History  Substance Use Topics  . Smoking status: Former Smoker    Types: Cigarettes    Quit date: 10/17/2011  . Smokeless tobacco: Never Used  . Alcohol use No     Allergies   Carrot [daucus carota]; Mushroom extract complex; and Peach [prunus persica]   Review of Systems Review of Systems  10 systems reviewed and found to be negative, except as noted in the HPI.  Physical Exam Updated Vital Signs BP 120/70 (BP Location: Left Arm)   Pulse 76   Temp 98.2 F (36.8 C) (Oral)   Resp 17  LMP 08/16/2016   SpO2 100%   Physical Exam  Constitutional: She is oriented to person, place, and time. She appears well-developed and well-nourished. No distress.  HENT:  Head: Normocephalic and atraumatic.  Mouth/Throat: Oropharynx is clear and moist.  Eyes: Conjunctivae and EOM are normal. Pupils are equal, round, and reactive to light.  Neck: Normal range of motion.  Cardiovascular: Normal rate, regular rhythm and intact distal pulses.   Pulmonary/Chest: Effort normal and breath sounds normal.  Abdominal: Soft. There is no tenderness.  Genitourinary:  Genitourinary Comments: Word catheter in place to IND of right labia majora with scant purulent discharge and no significant surrounding cellulitis   Musculoskeletal: Normal range of motion.  Neurological: She is alert and oriented to person, place, and time.  Skin: She is not diaphoretic.  Psychiatric: She has a normal mood and affect.  Nursing note and vitals reviewed.    ED Treatments / Results  Labs (all labs ordered are listed, but only abnormal results are displayed) Labs Reviewed - No data to display  EKG  EKG Interpretation None       Radiology No results found.  Procedures Procedures (including critical care time)  Medications Ordered in ED Medications - No data to display   Initial Impression / Assessment and Plan / ED Course  I have reviewed the triage vital signs and the nursing notes.  Pertinent labs & imaging results that were available during my care of the patient were reviewed by me and considered in my medical decision making (see chart for details).     Vitals:   08/20/16 1047  BP: 120/70  Pulse: 76  Resp: 17  Temp: 98.2 F (36.8 C)  TempSrc: Oral  SpO2: 100%    [name] is 26 y.o. female presenting with Irritation after placement of portacatheter yesterday. I have remove this at her request, wound is healing nicely and draining appropriately, counseled her on anticipatory guidance on what to expect encouraged her to get her antibiotics filled. [facility]  Evaluation does not show pathology that would require ongoing emergent intervention or inpatient treatment. Pt is hemodynamically stable and mentating appropriately. Discussed findings and plan with patient/guardian, who agrees with care plan. All questions answered. Return precautions discussed and outpatient follow up given.      Final Clinical Impressions(s) / ED Diagnoses   Final diagnoses:  Encounter for recheck of abscess following incision and drainage    New Prescriptions Discharge Medication List as of 08/20/2016 12:26 PM       Jill EmeryNicole Sheanna Dail, PA-C 08/20/16 1556    Jill LefevreJulie Haviland,  MD 08/21/16 [phone]

## 2016-08-20 NOTE — ED Notes (Signed)
Papers reviewed with patient and she verbalizes understanding. She sts she is unhappy with the bedside manner of the PA but sts she feels better and pain is improved. Panty liner given and patient cleaned up area.

## 2016-08-20 NOTE — ED Triage Notes (Signed)
Patient seen yesterday in ED for abscess to labia/groin. Reports had drain placed and now increased pain to area, states too much drainage and pain

## 2017-05-14 ENCOUNTER — Ambulatory Visit: Payer: Self-pay | Attending: Oncology

## 2017-05-14 VITALS — BP 127/88 | HR 84 | Temp 99.2°F | Ht 63.0 in | Wt 146.0 lb

## 2017-05-14 DIAGNOSIS — N63 Unspecified lump in unspecified breast: Secondary | ICD-10-CM

## 2017-05-14 NOTE — Progress Notes (Signed)
Subjective:     Patient ID: Clayton BiblesShynequa L Zeien, female   DOB: 04/12/1991, 26 y.o.   MRN: [id number]  HPI   Review of Systems     Objective:   Physical Exam  Pulmonary/Chest: Right breast exhibits no inverted nipple, no nipple discharge, no skin change and no tenderness. Left breast exhibits mass. Left breast exhibits no inverted nipple, no nipple discharge, no skin change and no tenderness. Breasts are symmetrical.    Fibroglandular tissue palpated upper outer right breast; Left breast mass x 3 10, 1,1nd 5 o'clock       Assessment:     [age] year old self referral patient presents for [facility] clinic visit with complaint of left breast mass x 2 weeks. Patient screened, and meets [facility] eligibility.  Patient does not have insurance, Medicare or Medicaid.  Handout given on Affordable Care Act.  Instructed patient on breast self-exam using teach back method.   Palpated 3 soft, mobile masses in left breast.  Palpated an area of fibroglandular tissue in right breast at 11 o'clock.  Patient has a 28 year old daughter.  Pl[place]    Plan:     Joellyn QuailsChristy Burton to schedule patient for bilateral breast ultrasound.  Order  Put in for bilateral diagnostic mammogram in case radiologist recommends.  No pap results found, though patient states [facility]  Christy to request patient bring copy of pap, or give her information on [facility] if she needs primary care to perform pap.

## 2017-05-16 ENCOUNTER — Ambulatory Visit
Admission: RE | Admit: 2017-05-16 | Discharge: 2017-05-16 | Disposition: A | Discharge status: Home / Self Care | Payer: Medicaid Other | Source: Ambulatory Visit | Attending: Oncology | Admitting: Oncology

## 2017-05-16 DIAGNOSIS — N63 Unspecified lump in unspecified breast: Secondary | ICD-10-CM

## 2017-05-17 ENCOUNTER — Other Ambulatory Visit: Payer: Self-pay

## 2017-05-17 DIAGNOSIS — N63 Unspecified lump in unspecified breast: Secondary | ICD-10-CM

## 2017-05-23 ENCOUNTER — Ambulatory Visit
Admission: RE | Admit: 2017-05-23 | Discharge: 2017-05-23 | Disposition: A | Discharge status: Home / Self Care | Payer: Medicaid Other | Source: Ambulatory Visit | Attending: Oncology | Admitting: Oncology

## 2017-05-23 DIAGNOSIS — N63 Unspecified lump in unspecified breast: Secondary | ICD-10-CM

## 2017-05-24 LAB — SURGICAL PATHOLOGY

## 2017-05-30 NOTE — Progress Notes (Signed)
Phoned patient with benign PASH results.  She states radiologist also called with results, ans recommended return at 26 years old for screening mammogram. Copy to [facility]

## 2017-10-04 ENCOUNTER — Inpatient Hospital Stay (HOSPITAL_COMMUNITY)
Admission: AD | Admit: 2017-10-04 | Discharge: 2017-10-04 | Disposition: A | Discharge status: Home / Self Care | Payer: Medicaid Other | Source: Ambulatory Visit | Attending: Obstetrics and Gynecology | Admitting: Obstetrics and Gynecology

## 2017-10-04 ENCOUNTER — Encounter (HOSPITAL_COMMUNITY): Payer: Self-pay | Admitting: *Deleted

## 2017-10-04 ENCOUNTER — Other Ambulatory Visit: Payer: Self-pay

## 2017-10-04 DIAGNOSIS — N764 Abscess of vulva: Secondary | ICD-10-CM | POA: Diagnosis not present

## 2017-10-04 DIAGNOSIS — L739 Follicular disorder, unspecified: Secondary | ICD-10-CM | POA: Diagnosis not present

## 2017-10-04 LAB — URINALYSIS, ROUTINE W REFLEX MICROSCOPIC
Bilirubin Urine: NEGATIVE
Glucose, UA: NEGATIVE mg/dL
Hgb urine dipstick: NEGATIVE
Ketones, ur: NEGATIVE mg/dL
Nitrite: NEGATIVE
PH: 6 (ref 5.0–8.0)
PROTEIN: NEGATIVE mg/dL
Specific Gravity, Urine: 1.013 (ref 1.005–1.030)

## 2017-10-04 LAB — WET PREP, GENITAL
CLUE CELLS WET PREP: NONE SEEN
Sperm: NONE SEEN
TRICH WET PREP: NONE SEEN
YEAST WET PREP: NONE SEEN

## 2017-10-04 LAB — POCT PREGNANCY, URINE: Preg Test, Ur: NEGATIVE

## 2017-10-04 MED ORDER — SULFAMETHOXAZOLE-TRIMETHOPRIM 800-160 MG PO TABS: 1.0000 | ORAL_TABLET | Freq: Two times a day (BID) | ORAL | 0 refills | Status: AC

## 2017-10-04 MED ORDER — ACETAMINOPHEN-CODEINE #3 300-30 MG PO TABS: 1.0000 | ORAL_TABLET | Freq: Four times a day (QID) | ORAL | 0 refills | Status: DC | PRN

## 2017-10-04 NOTE — MAU Note (Signed)
Bump on left labia, first noted a wk ago, from shaving, has gotten larger and more uncomfortable, bothers her to sit or walk, clothing is irritating-radiating down leg.

## 2017-10-05 LAB — GC/CHLAMYDIA PROBE AMP (~~LOC~~) NOT AT ARMC
CHLAMYDIA, DNA PROBE: NEGATIVE
Neisseria Gonorrhea: NEGATIVE

## 2017-10-05 LAB — HIV ANTIBODY (ROUTINE TESTING W REFLEX): HIV Screen 4th Generation wRfx: NONREACTIVE

## 2017-10-05 LAB — RPR: RPR Ser Ql: NONREACTIVE

## 2017-10-10 NOTE — MAU Provider Note (Signed)
Chief Complaint: Pelvic Pain and Abscess   First Provider Initiated Contact with Patient 10/04/17 [phone]      SUBJECTIVE HPI: Jill Reed is a 27 y.o. G1P0101 at Unknown by LMP who presents to maternity admissions reporting a bump on her left labia x 1 week, getting larger and more painful.  The pain is radiating down her left leg. It is sharp and burning pain. The bump started after shaving but became larger and more painful. She has tried Tylenol and ibuprofen but it is not enough. She has trouble sitting down or walking at work and clothing rubs the bump and causes more pain. There are no other associated symptoms. She has had this before, and it was diagnosed as an ingrown hair with infection. She denies abdominal pain, vaginal bleeding, vaginal itching/burning, urinary symptoms, h/a, dizziness, n/v, or fever/chills.     HPI  Past Medical History:  Diagnosis Date  . Anxiety   . Asthma   . Depression   . Irregular heart beat   . Sickle cell trait [facility])    Past Surgical History:  Procedure Laterality Date  . NO PAST SURGERIES     Social History   Socioeconomic History  . Marital status: Single    Spouse name: Not on file  . Number of children: Not on file  . Years of education: Not on file  . Highest education level: Not on file  Occupational History  . Not on file  Social Needs  . Financial resource strain: Not on file  . Food insecurity:    Worry: Not on file    Inability: Not on file  . Transportation needs:    Medical: Not on file    Non-medical: Not on file  Tobacco Use  . Smoking status: Former Smoker    Types: Cigarettes    Last attempt to quit: 10/17/2011    Years since quitting: 5.9  . Smokeless tobacco: Never Used  Substance and Sexual Activity  . Alcohol use: No  . Drug use: No  . Sexual activity: Not Currently    Birth control/protection: Condom  Lifestyle  . Physical activity:    Days per week: Not on file    Minutes per session: Not on file  .  Stress: Not on file  Relationships  . Social connections:    Talks on phone: Not on file    Gets together: Not on file    Attends religious service: Not on file    Active member of club or organization: Not on file    Attends meetings of clubs or organizations: Not on file    Relationship status: Not on file  . Intimate partner violence:    Fear of current or ex partner: Not on file    Emotionally abused: Not on file    Physically abused: Not on file    Forced sexual activity: Not on file  Other Topics Concern  . Not on file  Social History Narrative  . Not on file   No current facility-administered medications on file prior to encounter.    Current Outpatient Medications on File Prior to Encounter  Medication Sig Dispense Refill  . albuterol (PROVENTIL HFA;VENTOLIN HFA) 108 (90 Base) MCG/ACT inhaler Inhale 1-2 puffs into the lungs every 6 (six) hours as needed for wheezing or shortness of breath.    Marland Kitchen ibuprofen (ADVIL,MOTRIN) 200 MG tablet Take 200 mg by mouth every 6 (six) hours as needed for headache, moderate pain or cramping.  Allergies  Allergen Reactions  . Carrot [Daucus Carota] Itching and Rash  . Mushroom Extract Complex Itching and Rash  . Peach [Prunus Persica] Itching and Rash    ROS:  Review of Systems  Constitutional: Negative for chills and fever.  Respiratory: Negative for cough and shortness of breath.   Cardiovascular: Negative for chest pain.  Gastrointestinal: Negative for nausea and vomiting.  Genitourinary: Positive for vaginal pain. Negative for dysuria, frequency and urgency.  Musculoskeletal: Positive for myalgias.  Neurological: Negative for dizziness and headaches.     I have reviewed patient's Past Medical Hx, Surgical Hx, Family Hx, Social Hx, medications and allergies.   Physical Exam    VS reviewed during MAU visit were wnl  Constitutional: Well-developed, well-nourished female in no acute distress.  Cardiovascular: normal  rate Respiratory: normal effort GI: Abd soft, non-tender. Pos BS x 4 MS: Extremities nontender, no edema, normal ROM Neurologic: Alert and oriented x 4.  GU: Neg CVAT.  PELVIC EXAM: On visual inspection, 1 cm smooth, hard mobile raised area on upper left labia/mons that is painful to palpation  GC/wet prep collected by blind swab   LAB RESULTS  Results for orders placed or performed during the hospital encounter of 10/04/17 (from the past 168 hour(s))  GC/Chlamydia probe amp ([place])[facility] at [facility]   Collection Time: 10/04/17 12:00 AM  Result Value Ref Range   Chlamydia Negative    Neisseria gonorrhea Negative   Urinalysis, Routine w reflex microscopic   Collection Time: 10/04/17  8:52 AM  Result Value Ref Range   Color, Urine YELLOW YELLOW   APPearance HAZY (A) CLEAR   Specific Gravity, Urine 1.013 1.005 - 1.030   pH 6.0 5.0 - 8.0   Glucose, UA NEGATIVE NEGATIVE mg/dL   Hgb urine dipstick NEGATIVE NEGATIVE   Bilirubin Urine NEGATIVE NEGATIVE   Ketones, ur NEGATIVE NEGATIVE mg/dL   Protein, ur NEGATIVE NEGATIVE mg/dL   Nitrite NEGATIVE NEGATIVE   Leukocytes, UA TRACE (A) NEGATIVE   RBC / HPF 0-5 0 - 5 RBC/hpf   WBC, UA 6-30 0 - 5 WBC/hpf   Bacteria, UA RARE (A) NONE SEEN   Squamous Epithelial / LPF 6-30 (A) NONE SEEN   Mucus PRESENT   Pregnancy, urine POC   Collection Time: 10/04/17  9:32 AM  Result Value Ref Range   Preg Test, Ur NEGATIVE NEGATIVE  RPR   Collection Time: 10/04/17 10:16 AM  Result Value Ref Range   RPR Ser Ql Non Reactive Non Reactive  HIV antibody   Collection Time: 10/04/17 10:16 AM  Result Value Ref Range   HIV Screen 4th Generation wRfx Non Reactive Non Reactive  Wet prep, genital   Collection Time: 10/04/17 11:05 AM  Result Value Ref Range   Yeast Wet Prep HPF POC NONE SEEN NONE SEEN   Trich, Wet Prep NONE SEEN NONE SEEN   Clue Cells Wet Prep HPF POC NONE SEEN NONE SEEN   WBC, Wet Prep HPF POC FEW (A) NONE SEEN   Sperm NONE SEEN         IMAGING No results found.  MAU Management/MDM: Likely labial abscess from folliculitis.  It is not fluctuant so no I&D needed today.  Bactrim DS BID x 7 days, warm compresses at least BID.  Pt to return to care if worsening or not improving.  Return to MAU with emergencies.  Tylenol # 3 Rx x 10 tabs for pain management. Pt discharged with strict infection/pain precautions.  ASSESSMENT 1. Left  genital labial abscess   2. Folliculitis     PLAN Discharge home Allergies as of 10/04/2017      Reactions   Carrot [daucus Carota] Itching, Rash   Mushroom Extract Complex Itching, Rash   Peach [prunus Persica] Itching, Rash      Medication List    TAKE these medications   acetaminophen-codeine 300-30 MG tablet Commonly known as:  TYLENOL #3 Take 1-2 tablets by mouth every 6 (six) hours as needed for moderate pain.   albuterol 108 (90 Base) MCG/ACT inhaler Commonly known as:  PROVENTIL HFA;VENTOLIN HFA Inhale 1-2 puffs into the lungs every 6 (six) hours as needed for wheezing or shortness of breath.   ibuprofen 200 MG tablet Commonly known as:  ADVIL,MOTRIN Take 200 mg by mouth every 6 (six) hours as needed for headache, moderate pain or cramping.   sulfamethoxazole-trimethoprim 800-160 MG tablet Commonly known as:  BACTRIM DS,SEPTRA DS Take 1 tablet by mouth 2 (two) times daily for 7 days.      Follow-up Information    [facility].   Specialty:  Obstetrics and Gynecology Why:  for routine gyn care, return to MAU as needed for emergencies Contact information: [address] [place] [address] [phone]          Sharen Counter Certified Nurse-Midwife 10/10/2017  9:09 AM

## 2018-08-19 ENCOUNTER — Other Ambulatory Visit: Payer: Self-pay

## 2018-08-19 ENCOUNTER — Encounter (HOSPITAL_COMMUNITY): Payer: Self-pay | Admitting: Emergency Medicine

## 2018-08-19 ENCOUNTER — Emergency Department (HOSPITAL_COMMUNITY): Payer: Medicaid Other

## 2018-08-19 DIAGNOSIS — R202 Paresthesia of skin: Secondary | ICD-10-CM | POA: Insufficient documentation

## 2018-08-19 DIAGNOSIS — Z79899 Other long term (current) drug therapy: Secondary | ICD-10-CM | POA: Diagnosis not present

## 2018-08-19 DIAGNOSIS — J45909 Unspecified asthma, uncomplicated: Secondary | ICD-10-CM | POA: Diagnosis not present

## 2018-08-19 DIAGNOSIS — R079 Chest pain, unspecified: Secondary | ICD-10-CM | POA: Insufficient documentation

## 2018-08-19 DIAGNOSIS — R200 Anesthesia of skin: Secondary | ICD-10-CM | POA: Diagnosis present

## 2018-08-19 DIAGNOSIS — Z87891 Personal history of nicotine dependence: Secondary | ICD-10-CM | POA: Diagnosis not present

## 2018-08-19 DIAGNOSIS — M79601 Pain in right arm: Secondary | ICD-10-CM | POA: Diagnosis not present

## 2018-08-19 LAB — CBC
HEMATOCRIT: 36.9 % (ref 36.0–46.0)
Hemoglobin: 12.8 g/dL (ref 12.0–15.0)
MCH: 32.6 pg (ref 26.0–34.0)
MCHC: 34.7 g/dL (ref 30.0–36.0)
MCV: 93.9 fL (ref 80.0–100.0)
Platelets: 205 10*3/uL (ref 150–400)
RBC: 3.93 MIL/uL (ref 3.87–5.11)
RDW: 12.9 % (ref 11.5–15.5)
WBC: 10 10*3/uL (ref 4.0–10.5)
nRBC: 0 % (ref 0.0–0.2)

## 2018-08-19 MED ORDER — OXYCODONE-ACETAMINOPHEN 5-325 MG PO TABS
1.0000 | ORAL_TABLET | ORAL | Status: AC | PRN
  Administered 2018-08-19 – 2018-08-20 (×2): 1 via ORAL
  Filled 2018-08-19 (×2): qty 1

## 2018-08-19 MED ORDER — SODIUM CHLORIDE 0.9% FLUSH: 3.0000 mL | Freq: Once | INTRAVENOUS | Status: DC

## 2018-08-19 NOTE — ED Triage Notes (Signed)
Patient is complaining of right arm numbness that started around 3 pm. Patient states it started when she was getting her hair done. Patient does not have any other symptoms.

## 2018-08-20 ENCOUNTER — Emergency Department (HOSPITAL_COMMUNITY)
Admission: EM | Admit: 2018-08-20 | Discharge: 2018-08-20 | Disposition: A | Discharge status: Home / Self Care | Payer: Medicaid Other | Attending: Emergency Medicine | Admitting: Emergency Medicine

## 2018-08-20 DIAGNOSIS — R202 Paresthesia of skin: Secondary | ICD-10-CM

## 2018-08-20 DIAGNOSIS — M79601 Pain in right arm: Secondary | ICD-10-CM

## 2018-08-20 DIAGNOSIS — R079 Chest pain, unspecified: Secondary | ICD-10-CM

## 2018-08-20 LAB — POCT I-STAT TROPONIN I: Troponin i, poc: 0 ng/mL (ref 0.00–0.08)

## 2018-08-20 LAB — BASIC METABOLIC PANEL
Anion gap: 6 (ref 5–15)
BUN: 8 mg/dL (ref 6–20)
CHLORIDE: 105 mmol/L (ref 98–111)
CO2: 26 mmol/L (ref 22–32)
Calcium: 9.3 mg/dL (ref 8.9–10.3)
Creatinine, Ser: 0.69 mg/dL (ref 0.44–1.00)
GFR calc Af Amer: >60 mL/min (ref >60)
Glucose, Bld: 105 mg/dL — ABNORMAL HIGH (ref 70–99)
POTASSIUM: 3.5 mmol/L (ref 3.5–5.1)
Sodium: 137 mmol/L (ref 135–145)

## 2018-08-20 LAB — I-STAT BETA HCG BLOOD, ED (NOT ORDERABLE): I-stat hCG, quantitative: <5 m[IU]/mL (ref <5)

## 2018-08-20 MED ORDER — PREDNISONE 20 MG PO TABS: 40.0000 mg | ORAL_TABLET | Freq: Every day | ORAL | 0 refills | Status: DC

## 2018-08-20 MED ORDER — KETOROLAC TROMETHAMINE 60 MG/2ML IM SOLN
30.0000 mg | Freq: Once | INTRAMUSCULAR | Status: AC
  Administered 2018-08-20: 30 mg via INTRAMUSCULAR
  Filled 2018-08-20: qty 2

## 2018-08-20 MED ORDER — NAPROXEN 500 MG PO TABS: 500.0000 mg | ORAL_TABLET | Freq: Two times a day (BID) | ORAL | 0 refills | Status: DC

## 2018-08-20 NOTE — ED Provider Notes (Signed)
[facility]-EMERGENCY DEPT Provider Note   CSN: [id number] Arrival date & time: 08/19/18  1854     History   Chief Complaint Chief Complaint  Patient presents with  . Numbness    HPI Jill BiblesShynequa L Reed is a 28 y.o. female who presents with right arm pain, paresthesias, chest pain.  Past medical history significant for asthma, irregular heartbeat, anxiety/depression.  Patient states that she was getting her hair done earlier today and was seated when she started to have pain shooting down her right arm into her hand and associated numbness and tingling of all her fingers.  She asked that hairdresser if she could get up and move her arm.  She moved her arm around and massaged it which did not help.  She became worried that she was having a heart attack or stroke so decided to come to the ED. This has never happened before.  She was not having any chest pain during the initial episode however she states that since she has been in the emergency department she started to have central chest pain that felt like her heart was being squeezed. It is better with sitting up, worse with lying flat. She denies any fever but states that she had a cold last week.  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  Denies any trauma or weakness of the arm. She still has persistent shooting pain down her arm which feels like a burning and numbness/tingling in all her fingers. She denies neck pain. No recent surgery/travel/immobilization, hx of cancer, leg swelling, hemoptysis, prior DVT/PE, or hormone use.  HPI  Past Medical History:  Diagnosis Date  . Anxiety   . Asthma   . Depression   . Irregular heart beat   . Sickle cell trait (HCC)     There are no active problems to display for this patient.   Past Surgical History:  Procedure Laterality Date  . NO PAST SURGERIES       OB History    Gravida  1   Para  1   Term  0   Preterm  1   AB  0   Living  1     SAB  0   TAB  0   Ectopic  0   Multiple  0   Live Births  1            Home Medications    Prior to Admission medications   Medication Sig Start Date End Date Taking? Authorizing Provider  Nutritional Supplements (COLD AND FLU PO) Take 30 mLs by mouth every 6 (six) hours as needed (cough).   Yes [provider]  acetaminophen-codeine (TYLENOL #3) 300-30 MG tablet Take 1-2 tablets by mouth every 6 (six) hours as needed for moderate pain. Patient not taking: Reported on 08/20/2018 10/04/17   Hurshel PartyLeftwich-Kirby, Lisa A, CNM  albuterol (PROVENTIL HFA;VENTOLIN HFA) 108 (90 Base) MCG/ACT inhaler Inhale 1-2 puffs into the lungs every 6 (six) hours as needed for wheezing or shortness of breath.    [provider]  naproxen (NAPROSYN) 500 MG tablet Take 1 tablet (500 mg total) by mouth 2 (two) times daily. 08/20/18   Bethel BornGekas, Tashai Catino Marie, PA-C  predniSONE (DELTASONE) 20 MG tablet Take 2 tablets (40 mg total) by mouth daily. 08/20/18   Bethel BornGekas, Andjela Wickes Marie, PA-C    Family History Family History  Problem Relation Age of Onset  . Sickle cell anemia Father   .  Breast cancer Maternal Aunt        pt doesn't know age of diagnosis    Social History Social History   Tobacco Use  . Smoking status: Former Smoker    Types: Cigarettes    Last attempt to quit: 10/17/2011    Years since quitting: 6.8  . Smokeless tobacco: Never Used  Substance Use Topics  . Alcohol use: No  . Drug use: No     Allergies   Carrot [daucus carota]; Mushroom extract complex; and Peach [prunus persica]   Review of Systems Review of Systems  Constitutional: Negative for fever.  Respiratory: Negative for cough and shortness of breath.   Cardiovascular: Positive for chest pain. Negative for leg swelling.  Musculoskeletal: Positive for arthralgias and myalgias. Negative for neck pain.  Neurological: Positive for numbness. Negative for weakness.  All other systems reviewed and are  negative.    Physical Exam Updated Vital Signs BP 120/78   Pulse 78   Resp 18   Ht 5\' 3"  (1.6 m)   Wt 67.1 kg   LMP 07/31/2018   SpO2 99%   BMI 26.22 kg/m   Physical Exam Vitals signs and nursing note reviewed.  Constitutional:      General: She is not in acute distress.    Appearance: Normal appearance. She is well-developed. She is not ill-appearing.     Comments: Initially distressed however throughout history she becomes calm and cooperative  HENT:     Head: Normocephalic and atraumatic.  Eyes:     General: No scleral icterus.       Right eye: No discharge.        Left eye: No discharge.     Conjunctiva/sclera: Conjunctivae normal.     Pupils: Pupils are equal, round, and reactive to light.  Neck:     Musculoskeletal: Normal range of motion.  Cardiovascular:     Rate and Rhythm: Normal rate and regular rhythm.  Pulmonary:     Effort: Pulmonary effort is normal. No respiratory distress.     Breath sounds: Normal breath sounds.  Chest:     Chest wall: No tenderness.  Abdominal:     General: There is no distension.  Musculoskeletal:     Comments: Right arm: FROM of shoulder, elbow, wrist. No obvious swelling or deformity. Pain is increased with compression of the ulnar nerve. 2+ radial pulse  Skin:    General: Skin is warm and dry.  Neurological:     Mental Status: She is alert and oriented to person, place, and time.  Psychiatric:        Behavior: Behavior normal.      ED Treatments / Results  Labs (all labs ordered are listed, but only abnormal results are displayed) Labs Reviewed  BASIC METABOLIC PANEL - Abnormal; Notable for the following components:      Result Value   Glucose, Bld 105 (*)    All other components within normal limits  CBC  I-STAT TROPONIN, ED  I-STAT BETA HCG BLOOD, ED ([facility], [facility], [facility] ONLY)  POCT I-STAT TROPONIN I  I-STAT BETA HCG BLOOD, ED (NOT ORDERABLE)    EKG None  Radiology Dg Chest 2 View  Result Date:  08/19/2018 CLINICAL DATA:  Sudden onset of right arm numbness. Fingers tingling. EXAM: CHEST - 2 VIEW COMPARISON:  Two-view chest x-ray 06/06/2012 FINDINGS: The heart size and mediastinal contours are within normal limits. Both lungs are clear. The visualized skeletal structures are unremarkable. IMPRESSION: Negative two view chest x-ray  Electronically Signed   By: Marin Roberts M.D.   On: 08/19/2018 20:30    Procedures Procedures (including critical care time)  Medications Ordered in ED Medications  sodium chloride flush (NS) 0.9 % injection 3 mL (0 mLs Intravenous Hold 08/20/18 0210)  oxyCODONE-acetaminophen (PERCOCET/ROXICET) 5-325 MG per tablet 1 tablet (1 tablet Oral Given 08/20/18 0212)  ketorolac (TORADOL) injection 30 mg (30 mg Intramuscular Given 08/20/18 0213)     Initial Impression / Assessment and Plan / ED Course  I have reviewed the triage vital signs and the nursing notes.  Pertinent labs & imaging results that were available during my care of the patient were reviewed by me and considered in my medical decision making (see chart for details).  28 year old female presents with R arm pain and paresthesias since this afternoon and chest pain which started a couple hours ago in the ED. Her vitals are normal. Exam is remarkable with increased symptoms with compression of the ulnar nerve. Heart is regular rate and rhythm. Lungs are CTA. She also reports pain is better with sitting up. EKG is SR. CXR is negative. Labs and trop are 0. She is concerned about heart attack or stoke. She was given reassurance that that is not likely the cause of her symptoms. It sounds more like a nerve compression vs pericarditis with her hx of recent URI.  We will give IM Toradol and prescription for prednisone and referral to orthopedics.  Final Clinical Impressions(s) / ED Diagnoses   Final diagnoses:  Right arm pain  Paresthesias  Chest pain, unspecified type    ED Discharge Orders          Ordered    predniSONE (DELTASONE) 20 MG tablet  Daily     08/20/18 0216    naproxen (NAPROSYN) 500 MG tablet  2 times daily     08/20/18 0216           Bethel Born, PA-C 08/20/18 4665    Shaune Pollack, MD 08/20/18 [phone]

## 2018-08-20 NOTE — Discharge Instructions (Addendum)
Take Prednisone for the next 5 days Take Naproxen twice a day. Take with food Apply heat or ice to affected area Follow up with orthopedics

## 2018-10-11 ENCOUNTER — Other Ambulatory Visit: Payer: Self-pay

## 2018-10-11 ENCOUNTER — Ambulatory Visit (HOSPITAL_COMMUNITY)
Admission: EM | Admit: 2018-10-11 | Discharge: 2018-10-11 | Disposition: A | Discharge status: Home / Self Care | Payer: Medicaid Other | Attending: Emergency Medicine | Admitting: Emergency Medicine

## 2018-10-11 ENCOUNTER — Encounter (HOSPITAL_COMMUNITY): Payer: Self-pay | Admitting: Emergency Medicine

## 2018-10-11 DIAGNOSIS — B9789 Other viral agents as the cause of diseases classified elsewhere: Secondary | ICD-10-CM | POA: Diagnosis not present

## 2018-10-11 DIAGNOSIS — J069 Acute upper respiratory infection, unspecified: Secondary | ICD-10-CM | POA: Diagnosis not present

## 2018-10-11 MED ORDER — BENZONATATE 100 MG PO CAPS: 200.0000 mg | ORAL_CAPSULE | Freq: Three times a day (TID) | ORAL | 0 refills | Status: DC | PRN

## 2018-10-11 MED ORDER — DEXTROMETHORPHAN POLISTIREX ER 30 MG/5ML PO SUER: ORAL | 0 refills | Status: DC

## 2018-10-11 NOTE — ED Provider Notes (Signed)
[facility]    CSN: [id number] Arrival date & time: 10/11/18  1102     History   Chief Complaint Chief Complaint  Patient presents with  . Fever  . Abdominal Pain  . Cough    HPI Jill Reed is a 28 y.o. female.   [name] presents with complaints of cough. It is dry. Started out as a fever 3/23 and 3/24, tmax of 101.2, with headache and sensation of "head swimming." Fever managed with tylenol and motrin as she did not want to call out of work. She works in an assisted living. No known ill contacts specifically with Covid-19. No recent travel. Cough developed two days ago, fevers have improved. No shortness of breath . Hx of asthma, hasn't had to use inhaler. Doesn't smoke. Daughter has an ear infection. Sore throat due to cough. Decreased appetite with sensation of stomach "in knots." At [employer] french fries today. No vomiting. Stool was more loose than normal yesterday. Hasn't taken any other medications for symptoms. Tylenol last last night at aroudn 1700, none today.    ROS per HPI, negative if not otherwise mentioned.      Past Medical History:  Diagnosis Date  . Anxiety   . Asthma   . Depression   . Irregular heart beat   . Sickle cell trait (HCC)     There are no active problems to display for this patient.   Past Surgical History:  Procedure Laterality Date  . NO PAST SURGERIES      OB History    Gravida  1   Para  1   Term  0   Preterm  1   AB  0   Living  1     SAB  0   TAB  0   Ectopic  0   Multiple  0   Live Births  1            Home Medications    Prior to Admission medications   Medication Sig Start Date End Date Taking? Authorizing Provider  acetaminophen-codeine (TYLENOL #3) 300-30 MG tablet Take 1-2 tablets by mouth every 6 (six) hours as needed for moderate pain. Patient not taking: Reported on 08/20/2018 10/04/17   Hurshel Party, CNM  albuterol (PROVENTIL HFA;VENTOLIN HFA) 108 (90 Base)  MCG/ACT inhaler Inhale 1-2 puffs into the lungs every 6 (six) hours as needed for wheezing or shortness of breath.    [provider]  benzonatate (TESSALON) 100 MG capsule Take 2 capsules (200 mg total) by mouth 3 (three) times daily as needed for cough. 10/11/18   Georgetta Haber, NP  dextromethorphan (DELSYM) 30 MG/5ML liquid Per bottle instruct 10/11/18   Linus Mako B, NP  naproxen (NAPROSYN) 500 MG tablet Take 1 tablet (500 mg total) by mouth 2 (two) times daily. 08/20/18   Bethel Born, PA-C  Nutritional Supplements (COLD AND FLU PO) Take 30 mLs by mouth every 6 (six) hours as needed (cough).    [provider]  predniSONE (DELTASONE) 20 MG tablet Take 2 tablets (40 mg total) by mouth daily. 08/20/18   Bethel Born, PA-C    Family History Family History  Problem Relation Age of Onset  . Sickle cell anemia Father   . Breast cancer Maternal Aunt        pt doesn't know age of diagnosis    Social History Social History   Tobacco Use  . Smoking status: Former Smoker  Types: Cigarettes    Last attempt to quit: 10/17/2011    Years since quitting: 6.9  . Smokeless tobacco: Never Used  Substance Use Topics  . Alcohol use: No  . Drug use: No     Allergies   Carrot [daucus carota]; Mushroom extract complex; and Peach [prunus persica]   Review of Systems Review of Systems   Physical Exam Triage Vital Signs ED Triage Vitals  Enc Vitals Group     BP 10/11/18 1118 97/62     Pulse Rate 10/11/18 1118 68     Resp --      Temp 10/11/18 1118 99 F (37.2 C)     Temp Source 10/11/18 1118 Oral     SpO2 10/11/18 1118 98 %     Weight --      Height --      Head Circumference --      Peak Flow --      Pain Score 10/11/18 1119 0     Pain Loc --      Pain Edu? --      Excl. in GC? --    No data found.  Updated Vital Signs BP 97/62 (BP Location: Right Arm)   Pulse 68   Temp 99 F (37.2 C) (Oral)   LMP 09/19/2018 (Approximate)   SpO2 98%     Physical Exam Constitutional:      General: She is not in acute distress.    Appearance: She is well-developed.  HENT:     Head: Normocephalic and atraumatic.     Right Ear: Tympanic membrane, ear canal and external ear normal.     Left Ear: Tympanic membrane, ear canal and external ear normal.     Nose: Nose normal.     Mouth/Throat:     Pharynx: Uvula midline.     Tonsils: No tonsillar exudate.  Eyes:     Conjunctiva/sclera: Conjunctivae normal.     Pupils: Pupils are equal, round, and reactive to light.  Cardiovascular:     Rate and Rhythm: Normal rate and regular rhythm.     Heart sounds: Normal heart sounds.  Pulmonary:     Effort: Pulmonary effort is normal.     Breath sounds: Normal breath sounds.     Comments: Occasional strong dry cough noted  Abdominal:     General: Abdomen is flat.     Palpations: Abdomen is soft.  Skin:    General: Skin is warm and dry.  Neurological:     Mental Status: She is alert and oriented to person, place, and time.      UC Treatments / Results  Labs (all labs ordered are listed, but only abnormal results are displayed) Labs Reviewed - No data to display  EKG None  Radiology No results found.  Procedures Procedures (including critical care time)  Medications Ordered in UC Medications - No data to display  Initial Impression / Assessment and Plan / UC Course  I have reviewed the triage vital signs and the nursing notes.  Pertinent labs & imaging results that were available during my care of the patient were reviewed by me and considered in my medical decision making (see chart for details).     Temp has improved, 99 here today. No hypoxia. Non toxic in appearance, no increased work of breathing. Subjective fevers with cough, works in an assisted living. Encouraged self quarantine 7 days and/or 3 days fever free for concern for possible covid-19. cdc recommendations provided. Return precautions provided.  Continue with  supportive cares. Patient verbalized understanding and agreeable to plan.   Final Clinical Impressions(s) / UC Diagnoses   Final diagnoses:  Viral URI with cough     Discharge Instructions     Please remain home for at least 7 days since onset of symptoms- Monday- or at least for 3 days without fever without taking any tylenol or ibuprofen.  Push fluids to ensure adequate hydration and keep secretions thin.  Tessalon as needed for cough.  Use of your inhaler as needed. If any worsening of symptoms, difficulty breathing, shortness of breath , chest pain , or otherwise worsening please return or go to the ER.    ED Prescriptions    Medication Sig Dispense Auth. Provider   benzonatate (TESSALON) 100 MG capsule Take 2 capsules (200 mg total) by mouth 3 (three) times daily as needed for cough. 21 capsule Linus Mako B, NP   dextromethorphan (DELSYM) 30 MG/5ML liquid Per bottle instruct 89 mL Linus Mako B, NP     Controlled Substance Prescriptions [place] Controlled Substance Registry consulted? Not Applicable   Georgetta Haber, NP 10/11/18 1218

## 2018-10-11 NOTE — Discharge Instructions (Signed)
Please remain home for at least 7 days since onset of symptoms- Monday- or at least for 3 days without fever without taking any tylenol or ibuprofen.  Push fluids to ensure adequate hydration and keep secretions thin.  Tessalon as needed for cough.  Use of your inhaler as needed. If any worsening of symptoms, difficulty breathing, shortness of breath , chest pain , or otherwise worsening please return or go to the ER.

## 2018-10-11 NOTE — ED Triage Notes (Signed)
Pt reports a fever of 100.1 on Sunday and a fever of 101.1 on Monday.  Pt states she started taking liquid Tylenol for the fever and thinks she has been taking too much.  She states she has been taking it every 6 hours.    Pt reports a cough that started on Tuesday and lower abdominal pain that started on Wednesday.

## 2019-06-10 ENCOUNTER — Ambulatory Visit (HOSPITAL_COMMUNITY)
Admission: EM | Admit: 2019-06-10 | Discharge: 2019-06-10 | Disposition: A | Discharge status: Home / Self Care | Payer: Medicaid Other | Attending: Family Medicine | Admitting: Family Medicine

## 2019-06-10 ENCOUNTER — Encounter (HOSPITAL_COMMUNITY): Payer: Self-pay

## 2019-06-10 ENCOUNTER — Other Ambulatory Visit: Payer: Self-pay

## 2019-06-10 DIAGNOSIS — N898 Other specified noninflammatory disorders of vagina: Secondary | ICD-10-CM | POA: Diagnosis not present

## 2019-06-10 DIAGNOSIS — J039 Acute tonsillitis, unspecified: Secondary | ICD-10-CM | POA: Diagnosis not present

## 2019-06-10 LAB — POCT RAPID STREP A: Streptococcus, Group A Screen (Direct): NEGATIVE

## 2019-06-10 MED ORDER — METRONIDAZOLE 500 MG PO TABS: 500.0000 mg | ORAL_TABLET | Freq: Two times a day (BID) | ORAL | 0 refills | Status: DC

## 2019-06-10 MED ORDER — CETIRIZINE HCL 10 MG PO CAPS: 10.0000 mg | ORAL_CAPSULE | Freq: Every day | ORAL | 0 refills | Status: DC

## 2019-06-10 MED ORDER — FLUCONAZOLE 150 MG PO TABS: 150.0000 mg | ORAL_TABLET | Freq: Once | ORAL | 0 refills | Status: AC

## 2019-06-10 MED ORDER — IBUPROFEN 800 MG PO TABS: 800.0000 mg | ORAL_TABLET | Freq: Three times a day (TID) | ORAL | 0 refills | Status: DC

## 2019-06-10 MED ORDER — AMOXICILLIN 500 MG PO CAPS: 500.0000 mg | ORAL_CAPSULE | Freq: Two times a day (BID) | ORAL | 0 refills | Status: AC

## 2019-06-10 NOTE — Discharge Instructions (Addendum)
Sore Throat  Your rapid strep tested Negative today. I am treating you for tonsillitis- begin amoxicillin twice daily for 10 days, also begin daily cetirizine to help with drainage contributing to irritation  Please continue Tylenol or Ibuprofen for fever and pain. May try salt water gargles, cepacol lozenges, throat spray, or OTC cold relief medicine for throat discomfort. If you also have congestion take a daily anti-histamine like Zyrtec, Claritin, and a oral decongestant to help with post nasal drip that may be irritating your throat.   Stay hydrated and drink plenty of fluids to keep your throat coated relieve irritation.

## 2019-06-10 NOTE — ED Triage Notes (Signed)
Patient presents to Urgent Care with complaints of sore throat since 4 days ago. Patient reports she can see small white patches in the back of her throat, has been taking otc meds at home but it has not made significant improvement.

## 2019-06-10 NOTE — ED Provider Notes (Signed)
Jill Reed    CSN: [id number] Arrival date & time: 06/10/19  0907      History   Chief Complaint Chief Complaint  Patient presents with   Sore Throat    HPI Jill Reed is a 28 y.o. female history of asthma, presenting today for evaluation of sore throat as well as STD screening.  Patient states that over the past 4 days she has had hoarse voice as well as a sore throat.  She denies associated rhinorrhea or cough.  She denies any fevers chills or body aches.  She has noticed some white patches on her throat.  She has been using over-the-counter medicines without relief.  Denies sick contacts.  She would also like to be screened for STDs.  She notes that she has had an irregular discharge with associated odor.  She has had some occasional cramping, but has attributed this to recently ending her menstrual cycle.  Does have history of BV and feels similar.  HPI  Past Medical History:  Diagnosis Date   Anxiety    Asthma    Depression    Irregular heart beat    Sickle cell trait ([place])     There are no active problems to display for this patient.   Past Surgical History:  Procedure Laterality Date   NO PAST SURGERIES      OB History    Gravida  1   Para  1   Term  0   Preterm  1   AB  0   Living  1     SAB  0   TAB  0   Ectopic  0   Multiple  0   Live Births  1            Home Medications    Prior to Admission medications   Medication Sig Start Date End Date Taking? Authorizing Provider  acetaminophen-codeine (TYLENOL #3) 300-30 MG tablet Take 1-2 tablets by mouth every 6 (six) hours as needed for moderate pain. Patient not taking: Reported on 08/20/2018 10/04/17   Elvera Maria, CNM  albuterol (PROVENTIL HFA;VENTOLIN HFA) 108 (90 Base) MCG/ACT inhaler Inhale 1-2 puffs into the lungs every 6 (six) hours as needed for wheezing or shortness of breath.    [provider]  amoxicillin (AMOXIL) 500 MG capsule  Take 1 capsule (500 mg total) by mouth 2 (two) times daily for 10 days. 06/10/19 06/20/19  Audyn Dimercurio C, PA-C  benzonatate (TESSALON) 100 MG capsule Take 2 capsules (200 mg total) by mouth 3 (three) times daily as needed for cough. 10/11/18   Zigmund Gottron, NP  Cetirizine HCl 10 MG CAPS Take 1 capsule (10 mg total) by mouth daily for 10 days. 06/10/19 06/20/19  Shamari Trostel C, PA-C  dextromethorphan (DELSYM) 30 MG/5ML liquid Per bottle instruct 10/11/18   Augusto Gamble B, NP  fluconazole (DIFLUCAN) 150 MG tablet Take 1 tablet (150 mg total) by mouth once for 1 dose. 06/10/19 06/10/19  Lavada Langsam C, PA-C  ibuprofen (ADVIL) 800 MG tablet Take 1 tablet (800 mg total) by mouth 3 (three) times daily. 06/10/19   Venisa Frampton C, PA-C  metroNIDAZOLE (FLAGYL) 500 MG tablet Take 1 tablet (500 mg total) by mouth 2 (two) times daily. 06/10/19   Mairlyn Tegtmeyer C, PA-C  naproxen (NAPROSYN) 500 MG tablet Take 1 tablet (500 mg total) by mouth 2 (two) times daily. 08/20/18   Recardo Evangelist, PA-C  Nutritional Supplements (COLD AND  FLU PO) Take 30 mLs by mouth every 6 (six) hours as needed (cough).    [provider]  predniSONE (DELTASONE) 20 MG tablet Take 2 tablets (40 mg total) by mouth daily. 08/20/18   Bethel Born, PA-C    Family History Family History  Problem Relation Age of Onset   Sickle cell anemia Father    Breast cancer Maternal Aunt        pt doesn't know age of diagnosis   Healthy Mother     Social History Social History   Tobacco Use   Smoking status: Former Smoker    Types: Cigarettes    Quit date: 10/17/2011    Years since quitting: 7.6   Smokeless tobacco: Never Used  Substance Use Topics   Alcohol use: No   Drug use: No     Allergies   Carrot [daucus carota], Mushroom extract complex, and Peach [prunus persica]   Review of Systems Review of Systems  Constitutional: Negative for activity change, appetite change, chills, fatigue and  fever.  HENT: Positive for sore throat. Negative for congestion, ear pain, rhinorrhea, sinus pressure and trouble swallowing.   Eyes: Negative for discharge and redness.  Respiratory: Negative for cough, chest tightness and shortness of breath.   Cardiovascular: Negative for chest pain.  Gastrointestinal: Negative for abdominal pain, diarrhea, nausea and vomiting.  Genitourinary: Positive for vaginal discharge. Negative for dysuria, flank pain, genital sores, hematuria, menstrual problem, vaginal bleeding and vaginal pain.  Musculoskeletal: Negative for back pain and myalgias.  Skin: Negative for rash.  Neurological: Negative for dizziness, light-headedness and headaches.     Physical Exam Triage Vital Signs ED Triage Vitals  Enc Vitals Group     BP 06/10/19 0947 121/68     Pulse Rate 06/10/19 0947 62     Resp 06/10/19 0947 15     Temp 06/10/19 0947 98.8 F (37.1 C)     Temp Source 06/10/19 0947 Oral     SpO2 06/10/19 0947 98 %     Weight --      Height --      Head Circumference --      Peak Flow --      Pain Score 06/10/19 0945 9     Pain Loc --      Pain Edu? --      Excl. in GC? --    No data found.  Updated Vital Signs BP 121/68 (BP Location: Left Arm)    Pulse 62    Temp 98.8 F (37.1 C) (Oral)    Resp 15    SpO2 98%   Visual Acuity Right Eye Distance:   Left Eye Distance:   Bilateral Distance:    Right Eye Near:   Left Eye Near:    Bilateral Near:     Physical Exam Vitals signs and nursing note reviewed.  Constitutional:      General: She is not in acute distress.    Appearance: She is well-developed.  HENT:     Head: Normocephalic and atraumatic.     Ears:     Comments: Bilateral ears without tenderness to palpation of external auricle, tragus and mastoid, EAC's without erythema or swelling, TM's with good bony landmarks and cone of light. Non erythematous.     Mouth/Throat:     Comments: Oral mucosa pink and moist, bilateral tonsils moderately  enlarged, mild erythema with white patches bilaterally, posterior pharynx patent and nonerythematous, no uvula deviation or swelling. Normal phonation. Eyes:  Conjunctiva/sclera: Conjunctivae normal.  Neck:     Musculoskeletal: Neck supple.     Comments: Lymphadenopathy present to bilateral tonsillar and anterior cervical chain areas Cardiovascular:     Rate and Rhythm: Normal rate and regular rhythm.     Heart sounds: No murmur.  Pulmonary:     Effort: Pulmonary effort is normal. No respiratory distress.     Breath sounds: Normal breath sounds.     Comments: Breathing comfortably at rest, CTABL, no wheezing, rales or other adventitious sounds auscultated Abdominal:     Palpations: Abdomen is soft.     Tenderness: There is no abdominal tenderness.  Skin:    General: Skin is warm and dry.  Neurological:     Mental Status: She is alert.      UC Treatments / Results  Labs (all labs ordered are listed, but only abnormal results are displayed) Labs Reviewed  CULTURE, GROUP A STREP [facility])  POCT RAPID STREP A  CERVICOVAGINAL ANCILLARY ONLY    EKG   Radiology No results found.  Procedures Procedures (including critical care time)  Medications Ordered in UC Medications - No data to display  Initial Impression / Assessment and Plan / UC Course  I have reviewed the triage vital signs and the nursing notes.  Pertinent labs & imaging results that were available during my care of the patient were reviewed by me and considered in my medical decision making (see chart for details).     Strep test negative, given lymphadenopathy and appearance of tonsils will treat for tonsillitis with amoxicillin twice daily x10 days.  Airway patent, no sign of peritonsillar abscess at this time.  Also recommending cetirizine to help with any postnasal drainage contributing to symptoms, Tylenol and ibuprofen.  No other URI symptoms, Covid less likely.  Vaginal swab obtained, will send off to  screen for causes of discharge.  Empirically treating for BV today with metronidazole, provided Diflucan given patient's use of antibiotics may trigger yeast.  Will call with results and provide further treatment as needed.  Discussed strict return precautions. Patient verbalized understanding and is agreeable with plan.  Final Clinical Impressions(s) / UC Diagnoses   Final diagnoses:  Tonsillitis  Vaginal discharge     Discharge Instructions     Sore Throat  Your rapid strep tested Negative today. I am treating you for tonsillitis- begin amoxicillin twice daily for 10 days, also begin daily cetirizine to help with drainage contributing to irritation  Please continue Tylenol or Ibuprofen for fever and pain. May try salt water gargles, cepacol lozenges, throat spray, or OTC cold relief medicine for throat discomfort. If you also have congestion take a daily anti-histamine like Zyrtec, Claritin, and a oral decongestant to help with post nasal drip that may be irritating your throat.   Stay hydrated and drink plenty of fluids to keep your throat coated relieve irritation.    ED Prescriptions    Medication Sig Dispense Auth. Provider   amoxicillin (AMOXIL) 500 MG capsule Take 1 capsule (500 mg total) by mouth 2 (two) times daily for 10 days. 20 capsule Daionna Crossland C, PA-C   ibuprofen (ADVIL) 800 MG tablet Take 1 tablet (800 mg total) by mouth 3 (three) times daily. 21 tablet Athel Merriweather C, PA-C   metroNIDAZOLE (FLAGYL) 500 MG tablet Take 1 tablet (500 mg total) by mouth 2 (two) times daily. 14 tablet Tenia Goh C, PA-C   fluconazole (DIFLUCAN) 150 MG tablet Take 1 tablet (150 mg total) by mouth once  for 1 dose. 2 tablet Malyia Moro C, PA-C   Cetirizine HCl 10 MG CAPS Take 1 capsule (10 mg total) by mouth daily for 10 days. 10 capsule Edrei Norgaard, [place] C, PA-C     PDMP not reviewed this encounter.   Lew Dawes, PA-C 06/10/19 1109

## 2019-06-12 LAB — CULTURE, GROUP A STREP (THRC)

## 2019-06-13 LAB — CERVICOVAGINAL ANCILLARY ONLY
Bacterial vaginitis: POSITIVE — AB
Candida vaginitis: NEGATIVE
Chlamydia: NEGATIVE
Neisseria Gonorrhea: NEGATIVE
Trichomonas: NEGATIVE

## 2019-07-18 NOTE — L&D Delivery Note (Addendum)
OB/GYN Faculty Practice Delivery Note  PRENTISS HAMMETT is a 29 y.o. [id number] s/p VD @0225  at [redacted]w[redacted]d. She was admitted for SROM.   ROM: 15h 49m with clear fluid GBS Status: Neg Maximum Maternal Temperature: 98.7  Labor Progress: Patient presented s/p SROM. Labor augmentation with Pitocin up to 20mL/hr. Recurrent variable decelerations with good recovery during active phase. Progressed to vaginal delivery without complication.    Delivery Date/Time: 04/01/20 at 2:25 AM Delivery: Called to room and patient was complete and pushing. Head delivered ROA. No nuchal cord present. Shoulder and body delivered in usual fashion. Infant with spontaneous cry, placed on mother's abdomen, dried and stimulated. Cord clamped x 2 after 1-minute delay, and cut by FOB under my direct supervision. Cord blood drawn. Placenta delivered spontaneously with gentle cord traction. Fundus firm with massage and Pitocin. Labia, perineum, vagina, and cervix were inspected, no lacerations noted.   Placenta: 3-vessel cord, intact Complications: None Lacerations: None EBL: 50cc Analgesia: Epidural   Infant: boy  APGARs 9, 9  weight pending  04/03/20 PGY-1 Family Medicine   I was gloved and present for delivery of the infant and placenta.  Sabino Dick, MD OB Fellow, Faculty Practice 04/01/2020 3:18 AM

## 2019-07-29 ENCOUNTER — Encounter (HOSPITAL_COMMUNITY): Payer: Self-pay

## 2019-07-29 ENCOUNTER — Ambulatory Visit (HOSPITAL_COMMUNITY)
Admission: EM | Admit: 2019-07-29 | Discharge: 2019-07-29 | Disposition: A | Discharge status: Home / Self Care | Payer: Medicaid Other | Attending: Family Medicine | Admitting: Family Medicine

## 2019-07-29 DIAGNOSIS — Z3201 Encounter for pregnancy test, result positive: Secondary | ICD-10-CM

## 2019-07-29 LAB — POCT PREGNANCY, URINE: Preg Test, Ur: POSITIVE — AB

## 2019-07-29 LAB — POC URINE PREG, ED
Preg Test, Ur: POSITIVE — AB
Preg Test, Ur: POSITIVE — AB

## 2019-07-29 NOTE — ED Provider Notes (Signed)
[place]    CSN: [id number] Arrival date & time: 07/29/19  6073      History   Chief Complaint Chief Complaint  Patient presents with  . Possible Pregnancy    HPI Jill Reed is a 29 y.o. female.   Patient is a 29 year old female presents today for possible pregnancy.  Reporting she had 2 pregnancy tests at home that were positive.  Usually has menstrual cycle between the first and fifth of every month.  Has not had a menstrual cycle this month.  No abdominal pain, back pain, fever, vaginal bleeding.  ROS per HPI    Possible Pregnancy    Past Medical History:  Diagnosis Date  . Anxiety   . Asthma   . Depression   . Irregular heart beat   . Sickle cell trait ([place])     There are no problems to display for this patient.   Past Surgical History:  Procedure Laterality Date  . NO PAST SURGERIES      OB History    Gravida  1   Para  1   Term  0   Preterm  1   AB  0   Living  1     SAB  0   TAB  0   Ectopic  0   Multiple  0   Live Births  1            Home Medications    Prior to Admission medications   Medication Sig Start Date End Date Taking? Authorizing Provider  acetaminophen-codeine (TYLENOL #3) 300-30 MG tablet Take 1-2 tablets by mouth every 6 (six) hours as needed for moderate pain. Patient not taking: Reported on 08/20/2018 10/04/17   Elvera Maria, CNM  albuterol (PROVENTIL HFA;VENTOLIN HFA) 108 (90 Base) MCG/ACT inhaler Inhale 1-2 puffs into the lungs every 6 (six) hours as needed for wheezing or shortness of breath.    [provider]  Cetirizine HCl 10 MG CAPS Take 1 capsule (10 mg total) by mouth daily for 10 days. 06/10/19 06/20/19  Wieters, Hallie C, PA-C  dextromethorphan (DELSYM) 30 MG/5ML liquid Per bottle instruct 10/11/18   Augusto Gamble B, NP  ibuprofen (ADVIL) 800 MG tablet Take 1 tablet (800 mg total) by mouth 3 (three) times daily. 06/10/19   Wieters, Hallie C, PA-C  metroNIDAZOLE  (FLAGYL) 500 MG tablet Take 1 tablet (500 mg total) by mouth 2 (two) times daily. 06/10/19   Wieters, Hallie C, PA-C  naproxen (NAPROSYN) 500 MG tablet Take 1 tablet (500 mg total) by mouth 2 (two) times daily. 08/20/18   Recardo Evangelist, PA-C  Nutritional Supplements (COLD AND FLU PO) Take 30 mLs by mouth every 6 (six) hours as needed (cough).    [provider]    Family History Family History  Problem Relation Age of Onset  . Sickle cell anemia Father   . Breast cancer Maternal Aunt        pt doesn't know age of diagnosis  . Healthy Mother     Social History Social History   Tobacco Use  . Smoking status: Former Smoker    Types: Cigarettes    Quit date: 10/17/2011    Years since quitting: 7.7  . Smokeless tobacco: Never Used  Substance Use Topics  . Alcohol use: No  . Drug use: No     Allergies   Carrot [daucus carota], Mushroom extract complex, and Peach [prunus persica]   Review of Systems Review  of Systems   Physical Exam Triage Vital Signs ED Triage Vitals  Enc Vitals Group     BP 07/29/19 0846 116/76     Pulse Rate 07/29/19 0846 70     Resp 07/29/19 0846 15     Temp 07/29/19 0846 99.4 F (37.4 C)     Temp Source 07/29/19 0846 Oral     SpO2 07/29/19 0846 100 %     Weight --      Height --      Head Circumference --      Peak Flow --      Pain Score 07/29/19 0843 0     Pain Loc --      Pain Edu? --      Excl. in GC? --    No data found.  Updated Vital Signs BP 116/76 (BP Location: Left Arm)   Pulse 70   Temp 99.4 F (37.4 C) (Oral)   Resp 15   LMP 06/17/2019 (Exact Date)   SpO2 100%   Visual Acuity Right Eye Distance:   Left Eye Distance:   Bilateral Distance:    Right Eye Near:   Left Eye Near:    Bilateral Near:     Physical Exam Vitals and nursing note reviewed.  Constitutional:      General: She is not in acute distress.    Appearance: Normal appearance. She is not ill-appearing, toxic-appearing or diaphoretic.  HENT:      Head: Normocephalic.     Nose: Nose normal.     Mouth/Throat:     Pharynx: Oropharynx is clear.  Eyes:     Conjunctiva/sclera: Conjunctivae normal.  Pulmonary:     Effort: Pulmonary effort is normal.  Musculoskeletal:        General: Normal range of motion.     Cervical back: Normal range of motion.  Skin:    General: Skin is warm and dry.     Findings: No rash.  Neurological:     Mental Status: She is alert.  Psychiatric:        Mood and Affect: Mood normal.      UC Treatments / Results  Labs (all labs ordered are listed, but only abnormal results are displayed) Labs Reviewed  POCT PREGNANCY, URINE - Abnormal; Notable for the following components:      Result Value   Preg Test, Ur POSITIVE (*)    All other components within normal limits  POC URINE PREG, ED - Abnormal; Notable for the following components:   Preg Test, Ur POSITIVE (*)    All other components within normal limits  POC URINE PREG, ED - Abnormal; Notable for the following components:   Preg Test, Ur POSITIVE (*)    All other components within normal limits    EKG   Radiology No results found.  Procedures Procedures (including critical care time)  Medications Ordered in UC Medications - No data to display  Initial Impression / Assessment and Plan / UC Course  I have reviewed the triage vital signs and the nursing notes.  Pertinent labs & imaging results that were available during my care of the patient were reviewed by me and considered in my medical decision making (see chart for details).     Positive pregnancy test Contact given for OB/GYN follow-up Final Clinical Impressions(s) / UC Diagnoses   Final diagnoses:  Positive pregnancy test     Discharge Instructions     Your pregnancy test is positive. Call the OB/GYN listed  to schedule appoint for follow-up    ED Prescriptions    None     PDMP not reviewed this encounter.   Janace Aris, NP 07/29/19 [phone]

## 2019-07-29 NOTE — ED Triage Notes (Signed)
Pt presents to UC for pregnancy test. Pt reports she took 2 pregnancy test at home last night and the lines for positive pregnancy were faint.

## 2019-07-29 NOTE — Discharge Instructions (Addendum)
Your pregnancy test is positive. Call the OB/GYN listed to schedule appoint for follow-up

## 2019-08-22 ENCOUNTER — Inpatient Hospital Stay (HOSPITAL_COMMUNITY)
Admission: AD | Admit: 2019-08-22 | Discharge: 2019-08-22 | Disposition: A | Discharge status: Home / Self Care | Payer: Medicaid Other | Attending: Obstetrics & Gynecology | Admitting: Obstetrics & Gynecology

## 2019-08-22 ENCOUNTER — Encounter (HOSPITAL_COMMUNITY): Payer: Self-pay | Admitting: Obstetrics & Gynecology

## 2019-08-22 ENCOUNTER — Inpatient Hospital Stay (HOSPITAL_COMMUNITY): Payer: Medicaid Other

## 2019-08-22 ENCOUNTER — Other Ambulatory Visit: Payer: Self-pay

## 2019-08-22 DIAGNOSIS — O99511 Diseases of the respiratory system complicating pregnancy, first trimester: Secondary | ICD-10-CM | POA: Diagnosis not present

## 2019-08-22 DIAGNOSIS — O26891 Other specified pregnancy related conditions, first trimester: Secondary | ICD-10-CM

## 2019-08-22 DIAGNOSIS — R102 Pelvic and perineal pain: Secondary | ICD-10-CM

## 2019-08-22 DIAGNOSIS — Z79899 Other long term (current) drug therapy: Secondary | ICD-10-CM | POA: Insufficient documentation

## 2019-08-22 DIAGNOSIS — O219 Vomiting of pregnancy, unspecified: Secondary | ICD-10-CM

## 2019-08-22 DIAGNOSIS — Z3490 Encounter for supervision of normal pregnancy, unspecified, unspecified trimester: Secondary | ICD-10-CM

## 2019-08-22 DIAGNOSIS — O208 Other hemorrhage in early pregnancy: Secondary | ICD-10-CM | POA: Insufficient documentation

## 2019-08-22 DIAGNOSIS — J45909 Unspecified asthma, uncomplicated: Secondary | ICD-10-CM | POA: Diagnosis not present

## 2019-08-22 DIAGNOSIS — Z87891 Personal history of nicotine dependence: Secondary | ICD-10-CM | POA: Diagnosis not present

## 2019-08-22 DIAGNOSIS — D573 Sickle-cell trait: Secondary | ICD-10-CM | POA: Diagnosis not present

## 2019-08-22 DIAGNOSIS — Z3A01 Less than 8 weeks gestation of pregnancy: Secondary | ICD-10-CM

## 2019-08-22 DIAGNOSIS — O99011 Anemia complicating pregnancy, first trimester: Secondary | ICD-10-CM | POA: Diagnosis not present

## 2019-08-22 LAB — WET PREP, GENITAL
Sperm: NONE SEEN
Trich, Wet Prep: NONE SEEN
Yeast Wet Prep HPF POC: NONE SEEN

## 2019-08-22 LAB — CBC
HCT: 36 % (ref 36.0–46.0)
Hemoglobin: 12.8 g/dL (ref 12.0–15.0)
MCH: 32.4 pg (ref 26.0–34.0)
MCHC: 35.6 g/dL (ref 30.0–36.0)
MCV: 91.1 fL (ref 80.0–100.0)
Platelets: 205 10*3/uL (ref 150–400)
RBC: 3.95 MIL/uL (ref 3.87–5.11)
RDW: 12.3 % (ref 11.5–15.5)
WBC: 10 10*3/uL (ref 4.0–10.5)
nRBC: 0 % (ref 0.0–0.2)

## 2019-08-22 LAB — URINALYSIS, ROUTINE W REFLEX MICROSCOPIC
Bilirubin Urine: NEGATIVE
Glucose, UA: NEGATIVE mg/dL
Hgb urine dipstick: NEGATIVE
Ketones, ur: 5 mg/dL — AB
Nitrite: NEGATIVE
Protein, ur: NEGATIVE mg/dL
Specific Gravity, Urine: 1.016 (ref 1.005–1.030)
pH: 6 (ref 5.0–8.0)

## 2019-08-22 LAB — HIV ANTIBODY (ROUTINE TESTING W REFLEX): HIV Screen 4th Generation wRfx: NONREACTIVE

## 2019-08-22 LAB — HCG, QUANTITATIVE, PREGNANCY: hCG, Beta Chain, Quant, S: 106984 m[IU]/mL — ABNORMAL HIGH (ref <5)

## 2019-08-22 MED ORDER — DOCUSATE SODIUM 100 MG PO CAPS: 100.0000 mg | ORAL_CAPSULE | Freq: Every day | ORAL | 2 refills | Status: DC | PRN

## 2019-08-22 MED ORDER — POLYETHYLENE GLYCOL 3350 17 G PO PACK: 17.0000 g | PACK | Freq: Every day | ORAL | 0 refills | Status: DC

## 2019-08-22 MED ORDER — PROMETHAZINE HCL 25 MG PO TABS: 12.5000 mg | ORAL_TABLET | Freq: Four times a day (QID) | ORAL | 0 refills | Status: DC | PRN

## 2019-08-22 NOTE — Progress Notes (Signed)
GC/Chlamydia and wet prep cultures collected by this RN. 

## 2019-08-22 NOTE — Discharge Instructions (Signed)
First Trimester of Pregnancy The first trimester of pregnancy is from week 1 until the end of week 13 (months 1 through 3). A week after a sperm fertilizes an egg, the egg will implant on the wall of the uterus. This embryo will begin to develop into a baby. Genes from you and your partner will form the baby. The female genes will determine whether the baby will be a boy or a girl. At 6-8 weeks, the eyes and face will be formed, and the heartbeat can be seen on ultrasound. At the end of 12 weeks, all the baby's organs will be formed. Now that you are pregnant, you will want to do everything you can to have a healthy baby. Two of the most important things are to get good prenatal care and to follow your health care provider's instructions. Prenatal care is all the medical care you receive before the baby's birth. This care will help prevent, find, and treat any problems during the pregnancy and childbirth. Body changes during your first trimester Your body goes through many changes during pregnancy. The changes vary from woman to woman.  You may gain or lose a couple of pounds at first.  You may feel sick to your stomach (nauseous) and you may throw up (vomit). If the vomiting is uncontrollable, call your health care provider.  You may tire easily.  You may develop headaches that can be relieved by medicines. All medicines should be approved by your health care provider.  You may urinate more often. Painful urination may mean you have a bladder infection.  You may develop heartburn as a result of your pregnancy.  You may develop constipation because certain hormones are causing the muscles that push stool through your intestines to slow down.  You may develop hemorrhoids or swollen veins (varicose veins).  Your breasts may begin to grow larger and become tender. Your nipples may stick out more, and the tissue that surrounds them (areola) may become darker.  Your gums may bleed and may be  sensitive to brushing and flossing.  Dark spots or blotches (chloasma, mask of pregnancy) may develop on your face. This will likely fade after the baby is born.  Your menstrual periods will stop.  You may have a loss of appetite.  You may develop cravings for certain kinds of food.  You may have changes in your emotions from day to day, such as being excited to be pregnant or being concerned that something may go wrong with the pregnancy and baby.  You may have more vivid and strange dreams.  You may have changes in your hair. These can include thickening of your hair, rapid growth, and changes in texture. Some women also have hair loss during or after pregnancy, or hair that feels dry or thin. Your hair will most likely return to normal after your baby is born. What to expect at prenatal visits During a routine prenatal visit:  You will be weighed to make sure you and the baby are growing normally.  Your blood pressure will be taken.  Your abdomen will be measured to track your baby's growth.  The fetal heartbeat will be listened to between weeks 10 and 14 of your pregnancy.  Test results from any previous visits will be discussed. Your health care provider may ask you:  How you are feeling.  If you are feeling the baby move.  If you have had any abnormal symptoms, such as leaking fluid, bleeding, severe headaches, or abdominal   cramping.  If you are using any tobacco products, including cigarettes, chewing tobacco, and electronic cigarettes.  If you have any questions. Other tests that may be performed during your first trimester include:  Blood tests to find your blood type and to check for the presence of any previous infections. The tests will also be used to check for low iron levels (anemia) and protein on red blood cells (Rh antibodies). Depending on your risk factors, or if you previously had diabetes during pregnancy, you may have tests to check for high blood sugar  that affects pregnant women (gestational diabetes).  Urine tests to check for infections, diabetes, or protein in the urine.  An ultrasound to confirm the proper growth and development of the baby.  Fetal screens for spinal cord problems (spina bifida) and Down syndrome.  HIV (human immunodeficiency virus) testing. Routine prenatal testing includes screening for HIV, unless you choose not to have this test.  You may need other tests to make sure you and the baby are doing well. Follow these instructions at home: Medicines  Follow your health care provider's instructions regarding medicine use. Specific medicines may be either safe or unsafe to take during pregnancy.  Take a prenatal vitamin that contains at least 600 micrograms (mcg) of folic acid.  If you develop constipation, try taking a stool softener if your health care provider approves. Eating and drinking   Eat a balanced diet that includes fresh fruits and vegetables, whole grains, good sources of protein such as meat, eggs, or tofu, and low-fat dairy. Your health care provider will help you determine the amount of weight gain that is right for you.  Avoid raw meat and uncooked cheese. These carry germs that can cause birth defects in the baby.  Eating four or five small meals rather than three large meals a day may help relieve nausea and vomiting. If you start to feel nauseous, eating a few soda crackers can be helpful. Drinking liquids between meals, instead of during meals, also seems to help ease nausea and vomiting.  Limit foods that are high in fat and processed sugars, such as fried and sweet foods.  To prevent constipation: ? Eat foods that are high in fiber, such as fresh fruits and vegetables, whole grains, and beans. ? Drink enough fluid to keep your urine clear or pale yellow. Activity  Exercise only as directed by your health care provider. Most women can continue their usual exercise routine during  pregnancy. Try to exercise for 30 minutes at least 5 days a week. Exercising will help you: ? Control your weight. ? Stay in shape. ? Be prepared for labor and delivery.  Experiencing pain or cramping in the lower abdomen or lower back is a good sign that you should stop exercising. Check with your health care provider before continuing with normal exercises.  Try to avoid standing for long periods of time. Move your legs often if you must stand in one place for a long time.  Avoid heavy lifting.  Wear low-heeled shoes and practice good posture.  You may continue to have sex unless your health care provider tells you not to. Relieving pain and discomfort  Wear a good support bra to relieve breast tenderness.  Take warm sitz baths to soothe any pain or discomfort caused by hemorrhoids. Use hemorrhoid cream if your health care provider approves.  Rest with your legs elevated if you have leg cramps or low back pain.  If you develop varicose veins in   your legs, wear support hose. Elevate your feet for 15 minutes, 3-4 times a day. Limit salt in your diet. Prenatal care  Schedule your prenatal visits by the twelfth week of pregnancy. They are usually scheduled monthly at first, then more often in the last 2 months before delivery.  Write down your questions. Take them to your prenatal visits.  Keep all your prenatal visits as told by your health care provider. This is important. Safety  Wear your seat belt at all times when driving.  Make a list of emergency phone numbers, including numbers for family, friends, the hospital, and police and fire departments. General instructions  Ask your health care provider for a referral to a local prenatal education class. Begin classes no later than the beginning of month 6 of your pregnancy.  Ask for help if you have counseling or nutritional needs during pregnancy. Your health care provider can offer advice or refer you to specialists for help  with various needs.  Do not use hot tubs, steam rooms, or saunas.  Do not douche or use tampons or scented sanitary pads.  Do not cross your legs for long periods of time.  Avoid cat litter boxes and soil used by cats. These carry germs that can cause birth defects in the baby and possibly loss of the fetus by miscarriage or stillbirth.  Avoid all smoking, herbs, alcohol, and medicines not prescribed by your health care provider. Chemicals in these products affect the formation and growth of the baby.  Do not use any products that contain nicotine or tobacco, such as cigarettes and e-cigarettes. If you need help quitting, ask your health care provider. You may receive counseling support and other resources to help you quit.  Schedule a dentist appointment. At home, brush your teeth with a soft toothbrush and be gentle when you floss. Contact a health care provider if:  You have dizziness.  You have mild pelvic cramps, pelvic pressure, or nagging pain in the abdominal area.  You have persistent nausea, vomiting, or diarrhea.  You have a bad smelling vaginal discharge.  You have pain when you urinate.  You notice increased swelling in your face, hands, legs, or ankles.  You are exposed to fifth disease or chickenpox.  You are exposed to German measles (rubella) and have never had it. Get help right away if:  You have a fever.  You are leaking fluid from your vagina.  You have spotting or bleeding from your vagina.  You have severe abdominal cramping or pain.  You have rapid weight gain or loss.  You vomit blood or material that looks like coffee grounds.  You develop a severe headache.  You have shortness of breath.  You have any kind of trauma, such as from a fall or a car accident. Summary  The first trimester of pregnancy is from week 1 until the end of week 13 (months 1 through 3).  Your body goes through many changes during pregnancy. The changes vary from  woman to woman.  You will have routine prenatal visits. During those visits, your health care provider will examine you, discuss any test results you may have, and talk with you about how you are feeling. This information is not intended to replace advice given to you by your health care provider. Make sure you discuss any questions you have with your health care provider. Document Revised: 06/15/2017 Document Reviewed: 06/14/2016 Elsevier Patient Education  2020 [employer] Inc.  

## 2019-08-22 NOTE — MAU Note (Signed)
Pain started 3 days ago, started below left ribs, now down into LLQ.  This morning pain was sharp, didn't seem right.  No bleeding.

## 2019-08-22 NOTE — MAU Provider Note (Signed)
History     CSN: [id number]  Arrival date and time: 08/22/19 0801   First Provider Initiated Contact with Patient 08/22/19 0840      Chief Complaint  Patient presents with  . Pelvic Pain   Jill Reed is a 29 y.o. G2P0101 at 102w3d who presents today with left sided pain. She states that this pain started on 08/18/19.   Pelvic Pain The patient's primary symptoms include pelvic pain and vaginal discharge. This is a new problem. The current episode started in the past 7 days. The problem occurs intermittently. The problem has been gradually worsening. The problem affects the left side. She is pregnant. Associated symptoms include nausea. Pertinent negatives include no chills, dysuria, fever, frequency or vomiting. The vaginal discharge was white and copious. There has been no bleeding. She has not been passing clots. She has not been passing tissue. Nothing aggravates the symptoms. She has tried nothing for the symptoms. She uses nothing for contraception. Her menstrual history has been regular (LMP 06/17/2019).    OB History    Gravida  2   Para  1   Term  0   Preterm  1   AB  0   Living  1     SAB  0   TAB  0   Ectopic  0   Multiple  0   Live Births  1           Past Medical History:  Diagnosis Date  . Anxiety   . Asthma   . Depression   . Irregular heart beat   . Sickle cell trait [facility])     Past Surgical History:  Procedure Laterality Date  . NO PAST SURGERIES      Family History  Problem Relation Age of Onset  . Sickle cell anemia Father   . Breast cancer Maternal Aunt        pt doesn't know age of diagnosis  . Healthy Mother     Social History   Tobacco Use  . Smoking status: Former Smoker    Types: Cigarettes    Quit date: 10/17/2011    Years since quitting: 7.8  . Smokeless tobacco: Never Used  Substance Use Topics  . Alcohol use: No  . Drug use: No    Allergies:  Allergies  Allergen Reactions  . Carrot [Daucus Carota] Itching  and Rash  . Mushroom Extract Complex Itching and Rash  . Peach [Prunus Persica] Itching and Rash    Medications Prior to Admission  Medication Sig Dispense Refill Last Dose  . acetaminophen-codeine (TYLENOL #3) 300-30 MG tablet Take 1-2 tablets by mouth every 6 (six) hours as needed for moderate pain. (Patient not taking: Reported on 08/20/2018) 6 tablet 0   . albuterol (PROVENTIL HFA;VENTOLIN HFA) 108 (90 Base) MCG/ACT inhaler Inhale 1-2 puffs into the lungs every 6 (six) hours as needed for wheezing or shortness of breath.   Unknown at Unknown time  . Cetirizine HCl 10 MG CAPS Take 1 capsule (10 mg total) by mouth daily for 10 days. 10 capsule 0   . dextromethorphan (DELSYM) 30 MG/5ML liquid Per bottle instruct 89 mL 0   . ibuprofen (ADVIL) 800 MG tablet Take 1 tablet (800 mg total) by mouth 3 (three) times daily. 21 tablet 0   . metroNIDAZOLE (FLAGYL) 500 MG tablet Take 1 tablet (500 mg total) by mouth 2 (two) times daily. 14 tablet 0   . naproxen (NAPROSYN) 500 MG tablet Take 1 tablet (  500 mg total) by mouth 2 (two) times daily. 30 tablet 0   . Nutritional Supplements (COLD AND FLU PO) Take 30 mLs by mouth every 6 (six) hours as needed (cough).       Review of Systems  Constitutional: Negative for chills and fever.  Gastrointestinal: Positive for nausea. Negative for vomiting.  Genitourinary: Positive for pelvic pain and vaginal discharge. Negative for dysuria, frequency and vaginal bleeding.   Physical Exam   Blood pressure 110/65, pulse 71, temperature 98.9 F (37.2 C), temperature source Oral, resp. rate 16, height 5\' 2"  (1.575 m), weight 64.8 kg, last menstrual period 06/17/2019, SpO2 100 %.  Physical Exam  Nursing note and vitals reviewed. Constitutional: She is oriented to person, place, and time. She appears well-developed and well-nourished. No distress.  HENT:  Head: Normocephalic.  Cardiovascular: Normal rate.  Respiratory: Effort normal.  GI: Soft. There is no abdominal  tenderness. There is no rebound.  Neurological: She is alert and oriented to person, place, and time.  Skin: Skin is warm and dry.  Psychiatric: She has a normal mood and affect.   Results for orders placed or performed during the hospital encounter of 08/22/19 (from the past 24 hour(s))  Urinalysis, Routine w reflex microscopic     Status: Abnormal   Collection Time: 08/22/19  8:22 AM  Result Value Ref Range   Color, Urine YELLOW YELLOW   APPearance HAZY (A) CLEAR   Specific Gravity, Urine 1.016 1.005 - 1.030   pH 6.0 5.0 - 8.0   Glucose, UA NEGATIVE NEGATIVE mg/dL   Hgb urine dipstick NEGATIVE NEGATIVE   Bilirubin Urine NEGATIVE NEGATIVE   Ketones, ur 5 (A) NEGATIVE mg/dL   Protein, ur NEGATIVE NEGATIVE mg/dL   Nitrite NEGATIVE NEGATIVE   Leukocytes,Ua TRACE (A) NEGATIVE   RBC / HPF 0-5 0 - 5 RBC/hpf   WBC, UA 0-5 0 - 5 WBC/hpf   Bacteria, UA RARE (A) NONE SEEN   Squamous Epithelial / LPF 0-5 0 - 5   Mucus PRESENT   CBC     Status: None   Collection Time: 08/22/19  9:43 AM  Result Value Ref Range   WBC 10.0 4.0 - 10.5 K/uL   RBC 3.95 3.87 - 5.11 MIL/uL   Hemoglobin 12.8 12.0 - 15.0 g/dL   HCT 36.0 36.0 - 46.0 %   MCV 91.1 80.0 - 100.0 fL   MCH 32.4 26.0 - 34.0 pg   MCHC 35.6 30.0 - 36.0 g/dL   RDW 12.3 11.5 - 15.5 %   Platelets 205 150 - 400 K/uL   nRBC 0.0 0.0 - 0.2 %  Wet prep, genital     Status: Abnormal   Collection Time: 08/22/19  9:43 AM   Specimen: Vaginal  Result Value Ref Range   Yeast Wet Prep HPF POC NONE SEEN NONE SEEN   Trich, Wet Prep NONE SEEN NONE SEEN   Clue Cells Wet Prep HPF POC PRESENT (A) NONE SEEN   WBC, Wet Prep HPF POC MANY (A) NONE SEEN   Sperm NONE SEEN    US OB Comp Less 14 Wks  Result Date: 08/22/2019 CLINICAL DATA:  Left lower quadrant pain for 3 days, EXAM: OBSTETRIC <14 WK ULTRASOUND TECHNIQUE: Transabdominal ultrasound was performed for evaluation of the gestation as well as the maternal uterus and adnexal regions. COMPARISON:  None.  FINDINGS: Intrauterine gestational sac: Single Yolk sac:  Visualized. Embryo:  Visualized. Cardiac Activity: Visualized. Heart Rate: 96 bpm CRL:  11 mm  7 w   1 d EDD: 03/23/2020 Subchorionic hemorrhage: Small subchorionic hemorrhage along the inferior margin of the gestational sac. Maternal uterus/adnexae: Unremarkable. Corpus luteum cyst identified within the left ovary. IMPRESSION: 1. Single live intrauterine pregnancy as above, estimated age 55 weeks and 1 day. 2. Fetal cardiac activity is present, though heart rate is slightly bradycardic. 3. Small subchorionic hemorrhage. Electronically Signed   By: Sharlet Salina M.D.   On: 08/22/2019 10:18    MAU Course  Procedures  MDM   Assessment and Plan   1. Intrauterine pregnancy   2. Pelvic pain in pregnancy, antepartum, first trimester   3. Nausea and vomiting in pregnancy   4. [redacted] weeks gestation of pregnancy    DC home Comfort measures reviewed  1st Trimester precautions  Bleeding precautions RX: phenergan PRN #30, Colace PRN #30  Return to MAU as needed FU with OB as planned  Follow-up Information    [facility] Follow up.   Contact information: [address] [place] [address] [phone]         Thressa Sheller DNP, CNM  08/22/19  11:14 AM

## 2019-08-25 LAB — GC/CHLAMYDIA PROBE AMP (~~LOC~~) NOT AT ARMC
Chlamydia: NEGATIVE
Comment: NEGATIVE
Comment: NORMAL
Neisseria Gonorrhea: NEGATIVE

## 2019-08-29 ENCOUNTER — Ambulatory Visit (INDEPENDENT_AMBULATORY_CARE_PROVIDER_SITE_OTHER): Payer: Medicaid Other

## 2019-08-29 DIAGNOSIS — O0990 Supervision of high risk pregnancy, unspecified, unspecified trimester: Secondary | ICD-10-CM | POA: Insufficient documentation

## 2019-08-29 DIAGNOSIS — Z3A08 8 weeks gestation of pregnancy: Secondary | ICD-10-CM

## 2019-08-29 DIAGNOSIS — Z3480 Encounter for supervision of other normal pregnancy, unspecified trimester: Secondary | ICD-10-CM

## 2019-08-29 DIAGNOSIS — Z3481 Encounter for supervision of other normal pregnancy, first trimester: Secondary | ICD-10-CM

## 2019-08-29 MED ORDER — BLOOD PRESSURE KIT: 1.0000 | PACK | Freq: Once | 0 refills | Status: AC

## 2019-08-29 NOTE — Progress Notes (Signed)
I connected with  [name] on 08/29/19 by telephone and verified that I am speaking with the correct person using two identifiers.   OB History    Gravida  2   Para  1   Term  0   Preterm  1   AB  0   Living  1     SAB  0   TAB  0   Ectopic  0   Multiple  0   Live Births  1          Assessment     Plan   F/u on 09/05/19

## 2019-09-05 ENCOUNTER — Ambulatory Visit (INDEPENDENT_AMBULATORY_CARE_PROVIDER_SITE_OTHER): Payer: Medicaid Other | Admitting: Medical

## 2019-09-05 ENCOUNTER — Other Ambulatory Visit (HOSPITAL_COMMUNITY)
Admission: RE | Admit: 2019-09-05 | Discharge: 2019-09-05 | Disposition: A | Discharge status: Home / Self Care | Payer: Medicaid Other | Source: Ambulatory Visit | Attending: Medical | Admitting: Medical

## 2019-09-05 ENCOUNTER — Encounter: Payer: Self-pay | Admitting: Medical

## 2019-09-05 VITALS — BP 103/66 | HR 87 | Wt 141.0 lb

## 2019-09-05 DIAGNOSIS — O0990 Supervision of high risk pregnancy, unspecified, unspecified trimester: Secondary | ICD-10-CM

## 2019-09-05 DIAGNOSIS — Z3A00 Weeks of gestation of pregnancy not specified: Secondary | ICD-10-CM | POA: Insufficient documentation

## 2019-09-05 DIAGNOSIS — I499 Cardiac arrhythmia, unspecified: Secondary | ICD-10-CM

## 2019-09-05 DIAGNOSIS — O99513 Diseases of the respiratory system complicating pregnancy, third trimester: Secondary | ICD-10-CM | POA: Insufficient documentation

## 2019-09-05 DIAGNOSIS — D573 Sickle-cell trait: Secondary | ICD-10-CM

## 2019-09-05 DIAGNOSIS — N949 Unspecified condition associated with female genital organs and menstrual cycle: Secondary | ICD-10-CM

## 2019-09-05 DIAGNOSIS — O361910 Maternal care for other isoimmunization, first trimester, not applicable or unspecified: Secondary | ICD-10-CM

## 2019-09-05 DIAGNOSIS — O09291 Supervision of pregnancy with other poor reproductive or obstetric history, first trimester: Secondary | ICD-10-CM

## 2019-09-05 DIAGNOSIS — Z3481 Encounter for supervision of other normal pregnancy, first trimester: Secondary | ICD-10-CM | POA: Diagnosis not present

## 2019-09-05 DIAGNOSIS — J45909 Unspecified asthma, uncomplicated: Secondary | ICD-10-CM

## 2019-09-05 DIAGNOSIS — Z3A09 9 weeks gestation of pregnancy: Secondary | ICD-10-CM

## 2019-09-05 DIAGNOSIS — O09211 Supervision of pregnancy with history of pre-term labor, first trimester: Secondary | ICD-10-CM

## 2019-09-05 DIAGNOSIS — O99511 Diseases of the respiratory system complicating pregnancy, first trimester: Secondary | ICD-10-CM

## 2019-09-05 DIAGNOSIS — O09299 Supervision of pregnancy with other poor reproductive or obstetric history, unspecified trimester: Secondary | ICD-10-CM

## 2019-09-05 MED ORDER — PRENATAL + COMPLETE MULTI 0.267 & 373 MG PO THPK: 1.0000 | PACK | Freq: Every day | ORAL | 11 refills | Status: DC

## 2019-09-05 MED ORDER — BLOOD PRESSURE KIT DEVI: 1.0000 | 0 refills | Status: DC

## 2019-09-05 NOTE — Progress Notes (Signed)
   PRENATAL VISIT NOTE  Subjective:  Jill Reed is a 29 y.o. G2P0101 at 73w1dby 7 week [place] seen today for her first prenatal visit for this pregnancy.  She is currently monitored for the following issues for this high-risk pregnancy and has Supervision of high risk pregnancy, antepartum; Irregular heart beat; Sickle cell trait ([place]; Asthma affecting pregnancy in first trimester; and History of preterm labor, current pregnancy, first trimester on their problem list.  Patient reports no complaints.  Contractions: Not present. Vag. Bleeding: None.   . Denies leaking of fluid.   She is planning to both breast and bottle feed. Desires contraception, undecided on type.   The following portions of the patient's history were reviewed and updated as appropriate: allergies, current medications, past family history, past medical history, past social history, past surgical history and problem list.   Objective:   Vitals:   09/05/19 0929  BP: 103/66  Pulse: 87  Weight: 141 lb (64 kg)    Fetal Status: Fetal Heart Rate (bpm): 162         General:  Alert, oriented and cooperative. Patient is in no acute distress.  Skin: Skin is warm and dry. No rash noted.   Cardiovascular: Normal heart rate and rhythm noted  Respiratory: Normal respiratory effort, no problems with respiration noted. Clear to auscultation.   Abdomen: Soft, gravid, appropriate for gestational age. Normal bowel sounds. Non-tender. Pain/Pressure: Present     Pelvic: Cervical exam performed       closed, thick Normal cervical contour, no lesions, no bleeding following pap, normal discharge Breast: declined  Extremities: Normal range of motion.  Edema: None  Mental Status: Normal mood and affect. Normal behavior. Normal judgment and thought content.   Assessment and Plan:  Pregnancy: G2P0101 at 960w1d. Supervision of high risk pregnancy, antepartum - Enroll Patient in Babyscripts - Obstetric Panel, Including HIV -  Culture, OB Urine - Cytology - PAP( [place]) - Prenat-Methylfol-Chol-Fish Oil (PRENATAL + COMPLETE MULTI) 0.267 & 373 MG THPK; Take 1 tablet by mouth daily.  Dispense: 30 each; Refill: 11 - Blood Pressure Monitoring (BLOOD PRESSURE KIT) DEVI; 1 kit by Does not apply route once a week. Check Blood Pressure regularly and record readings into the Babyscripts App.  Large Cuff.  DX O90.0  Dispense: 1 each; Refill: 0  2. Sickle cell trait ([place] 3. Irregular heart beat - Ambulatory referral to Cardiology  4. Asthma affecting pregnancy in first trimester - PRN inhaler use currently   5. Round ligament pain - Discussed abdominal binder and activity restrictions  6. History of preterm labor, current pregnancy, first trimester - Discussed 17-P, patient will consider and discuss again at next visit with MD at 14 weeks   Discussed the nature of our practice with multiple providers Discussed that students, residents, fellows, NP, CNM, PA and physicians may be part of her care  Informed patient of the expected cadence of prenatal visits   Preterm labor/ first trimester warning symptoms and general obstetric precautions including but not limited to vaginal bleeding, contractions, leaking of fluid and fetal movement were reviewed in detail with the patient. Please refer to After Visit Summary for other counseling recommendations.   [facility] in about 4 weeks (around 10/03/2019) [facility]  Future Appointments  Date Time Provider [place]3/19/2021  9:00 AM ErChancy MilroyMD CWH[facility]linone    JuKerry HoughPA-C

## 2019-09-05 NOTE — Patient Instructions (Signed)
Preventing Preterm Birth Preterm birth is when your baby is delivered between 20 weeks and 37 weeks of pregnancy. A full-term pregnancy lasts for at least 37 weeks. Preterm birth can be dangerous for your baby because the last few weeks of pregnancy are an important time for your baby's brain and lungs to grow. Many things can cause a baby to be born early. Sometimes the cause is not known. There are certain factors that make you more likely to experience preterm birth, such as:  Having a previous baby born preterm.  Being pregnant with twins or other multiples.  Having had fertility treatment.  Being overweight or underweight at the start of your pregnancy.  Having any of the following during pregnancy: ? An infection, including a urinary tract infection (UTI) or an STI (sexually transmitted infection). ? High blood pressure. ? Diabetes. ? Vaginal bleeding.  Being age 31 or older.  Being age 31 or younger.  Getting pregnant within 6 months of a previous pregnancy.  Suffering extreme stress or physical or emotional abuse during pregnancy.  Standing for long periods of time during pregnancy, such as working at a job that requires standing. What are the risks? The most serious risk of preterm birth is that the baby may not survive. This is more likely to happen if a baby is born before 34 weeks. Other risks and complications of preterm birth may include your baby having:  Breathing problems.  Brain damage that affects movement and coordination (cerebral palsy).  Feeding difficulties.  Vision or hearing problems.  Infections or inflammation of the digestive tract (colitis).  Developmental delays.  Learning disabilities.  Higher risk for diabetes, heart disease, and high blood pressure later in life. What can I do to lower my risk?  Medical care The most important thing you can do to lower your risk for preterm birth is to get routine medical care during pregnancy (prenatal  care). If you have a high risk of preterm birth, you may be referred to a health care provider who specializes in managing high-risk pregnancies (perinatologist). You may be given medicine to help prevent preterm birth. Lifestyle changes Certain lifestyle changes can also lower your risk of preterm birth:  Wait at least 6 months after a pregnancy to become pregnant again.  Try to plan pregnancy for when you are between 31 and 35 years old.  Get to a healthy weight before getting pregnant. If you are overweight, work with your health care provider to safely lose weight.  Do not use any products that contain nicotine or tobacco, such as cigarettes and e-cigarettes. If you need help quitting, ask your health care provider.  Do not drink alcohol.  Do not use drugs. Where to find support For more support, consider:  Talking with your health care provider.  Talking with a therapist or substance abuse counselor, if you need help quitting.  Working with a diet and nutrition specialist (dietitian) or a personal trainer to maintain a healthy weight.  Joining a support group. Where to find more information Learn more about preventing preterm birth from:  Centers for Disease Control and Prevention: cdc.gov/reproductivehealth/maternalinfanthealth/pretermbirth.htm  March of Dimes: marchofdimes.org/complications/premature-babies.aspx  American Pregnancy Association: americanpregnancy.org/labor-and-birth/premature-labor Contact a health care provider if:  You have any of the following signs of preterm labor before 37 weeks: ? A change or increase in vaginal discharge. ? Fluid leaking from your vagina. ? Pressure or cramps in your lower abdomen. ? A backache that does not go away or gets worse. ?   Regular tightening (contractions) in your lower abdomen. Summary  Preterm birth means having your baby during weeks 20-37 of pregnancy.  Preterm birth may put your baby at risk for physical and  mental problems.  Getting good prenatal care can help prevent preterm birth.  You can lower your risk of preterm birth by making certain lifestyle changes, such as not smoking and not using alcohol. This information is not intended to replace advice given to you by your health care provider. Make sure you discuss any questions you have with your health care provider. Document Revised: 06/15/2017 Document Reviewed: 03/11/2016 Elsevier Patient Education  2020 Elsevier Inc.  

## 2019-09-05 NOTE — Progress Notes (Signed)
New OB.  Declined FLU vaccine 

## 2019-09-07 LAB — URINE CULTURE, OB REFLEX

## 2019-09-07 LAB — CULTURE, OB URINE

## 2019-09-09 ENCOUNTER — Encounter: Payer: Self-pay | Admitting: Medical

## 2019-09-09 DIAGNOSIS — O361930 Maternal care for other isoimmunization, third trimester, not applicable or unspecified: Secondary | ICD-10-CM | POA: Insufficient documentation

## 2019-09-09 DIAGNOSIS — O361910 Maternal care for other isoimmunization, first trimester, not applicable or unspecified: Secondary | ICD-10-CM | POA: Insufficient documentation

## 2019-09-09 LAB — OBSTETRIC PANEL, INCLUDING HIV
Basophils Absolute: 0 10*3/uL (ref 0.0–0.2)
Basos: 0 %
EOS (ABSOLUTE): 0.1 10*3/uL (ref 0.0–0.4)
Eos: 1 %
HIV Screen 4th Generation wRfx: NONREACTIVE
Hematocrit: 37.1 % (ref 34.0–46.6)
Hemoglobin: 12.9 g/dL (ref 11.1–15.9)
Hepatitis B Surface Ag: NEGATIVE
Immature Grans (Abs): 0 10*3/uL (ref 0.0–0.1)
Immature Granulocytes: 0 %
Lymphocytes Absolute: 2.1 10*3/uL (ref 0.7–3.1)
Lymphs: 19 %
MCH: 32.4 pg (ref 26.6–33.0)
MCHC: 34.8 g/dL (ref 31.5–35.7)
MCV: 93 fL (ref 79–97)
Monocytes Absolute: 0.8 10*3/uL (ref 0.1–0.9)
Monocytes: 7 %
Neutrophils Absolute: 8.2 10*3/uL — ABNORMAL HIGH (ref 1.4–7.0)
Neutrophils: 73 %
Platelets: 219 10*3/uL (ref 150–450)
RBC: 3.98 x10E6/uL (ref 3.77–5.28)
RDW: 12.5 % (ref 11.7–15.4)
RPR Ser Ql: NONREACTIVE
Rh Factor: POSITIVE
Rubella Antibodies, IGG: 3.5 index (ref >0.99)
WBC: 11.2 10*3/uL — ABNORMAL HIGH (ref 3.4–10.8)

## 2019-09-09 LAB — CYTOLOGY - PAP
Comment: NEGATIVE
Diagnosis: NEGATIVE
Diagnosis: REACTIVE
High risk HPV: NEGATIVE

## 2019-09-09 LAB — AB SCR+ANTIBODY ID: Antibody Screen: POSITIVE — AB

## 2019-09-11 ENCOUNTER — Encounter: Payer: Self-pay | Admitting: Cardiovascular Disease

## 2019-09-11 ENCOUNTER — Other Ambulatory Visit: Payer: Self-pay

## 2019-09-11 ENCOUNTER — Ambulatory Visit (INDEPENDENT_AMBULATORY_CARE_PROVIDER_SITE_OTHER): Payer: Medicaid Other | Admitting: Cardiovascular Disease

## 2019-09-11 VITALS — BP 115/73 | HR 72 | Ht 62.0 in | Wt 141.0 lb

## 2019-09-11 DIAGNOSIS — R0602 Shortness of breath: Secondary | ICD-10-CM | POA: Diagnosis not present

## 2019-09-11 DIAGNOSIS — R0789 Other chest pain: Secondary | ICD-10-CM | POA: Diagnosis not present

## 2019-09-11 DIAGNOSIS — Z720 Tobacco use: Secondary | ICD-10-CM | POA: Diagnosis not present

## 2019-09-11 DIAGNOSIS — R079 Chest pain, unspecified: Secondary | ICD-10-CM | POA: Diagnosis not present

## 2019-09-11 NOTE — Patient Instructions (Addendum)
Medication Instructions:  Your physician recommends that you continue on your current medications as directed. Please refer to the Current Medication list given to you today.  *If you need a refill on your cardiac medications before your next appointment, please call your pharmacy*  Lab Work: SED RATE TODAY   YOU WILL NEED A COVID SCREENING TEST 3 DAYS PRIOR TO YOUR TREADMILL  Testing/Procedures: Your physician has requested that you have an echocardiogram. Echocardiography is a painless test that uses sound waves to create images of your heart. It provides your doctor with information about the size and shape of your heart and how well your heart's chambers and valves are working. This procedure takes approximately one hour. There are no restrictions for this procedure.  Your physician has requested that you have an exercise tolerance test. For further information please visit [email or web address] Please also follow instruction sheet, as given.  Follow-Up: AS NEEDED  Other Instructions  Exercise Stress Test An exercise stress test is a test to check how your heart works during exercise. You will need to walk on a treadmill or ride an exercise bike for this test. An electrocardiogram (ECG) will record your heartbeat when you are at rest and when you are exercising. You may have an ultrasound or nuclear test after the exercise test. The test is done to check for coronary artery disease (CAD). It is also done to:  See how well you can exercise.  Watch for high blood pressure during exercise.  Test how well you can exercise after treatment.  Check the blood flow to your arms and legs. If your test result is not normal, more testing may be needed. What happens before the procedure?  Follow instructions from your doctor about what you cannot eat or drink. ? Do not have any drinks or foods that have caffeine in them for 24 hours before the test, or as told by your doctor. This includes  coffee, tea (even decaf tea), sodas, chocolate, and cocoa.  Ask your doctor about changing or stopping your normal medicines. This is important if you: ? Take diabetes medicines. ? Take beta-blocker medicines. ? Wear a nitroglycerin patch.  If you use an inhaler, bring it with you to the test.  Do not put lotions, powders, creams, or oils on your chest before the test.  Wear comfortable shoes and clothing.  Do not use any products that have nicotine or tobacco in them, such as cigarettes and e-cigarettes. Stop using them at least 4 hours before the test. If you need help quitting, ask your doctor. What happens during the procedure?   Patches (electrodes) will be put on your chest.  Wires will be connected to the patches. The wires will send signals to a machine to record your heartbeat.  Your heart rate will be watched while you are resting and while you are exercising. Your blood pressure will also be watched during the test.  You will walk on a treadmill or use a stationary bike. If you cannot use these, you may be asked to turn a crank with your hands.  The activity will get harder and will raise your heart rate.  You may be asked to breathe into a tube a few times during the test. This measures the gases that you breathe out.  You will be asked how you are feeling throughout the test.  You will exercise until your heart reaches a target heart rate. You will stop early if: ? You feel dizzy. ? You  have chest pain. ? You are out of breath. ? Your blood pressure is too high or too low. ? You have an irregular heartbeat. ? You have pain or aching in your arms or legs. The procedure may vary among doctors and hospitals. What happens after the procedure?  Your blood pressure, heart rate, breathing rate, and blood oxygen level will be watched after the test.  You may return to your normal diet and activities as told by your doctor.  It is up to you to get the results of your  test. Ask your doctor, or the department that is doing the test, when your results will be ready. Summary  An exercise stress test is a test to check how your heart works during exercise.  This test is done to check for coronary artery disease.  Your heart rate will be watched while you are resting and while you are exercising.  Follow instructions from your doctor about what you cannot eat or drink before the test. This information is not intended to replace advice given to you by your health care provider. Make sure you discuss any questions you have with your health care provider. Document Revised: 10/15/2018 Document Reviewed: 10/03/2016 Elsevier Patient Education  [place].  Echocardiogram An echocardiogram is a procedure that uses painless sound waves (ultrasound) to produce an image of the heart. Images from an echocardiogram can provide important information about:  Signs of coronary artery disease (CAD).  Aneurysm detection. An aneurysm is a weak or damaged part of an artery wall that bulges out from the normal force of blood pumping through the body.  Heart size and shape. Changes in the size or shape of the heart can be associated with certain conditions, including heart failure, aneurysm, and CAD.  Heart muscle function.  Heart valve function.  Signs of a past heart attack.  Fluid buildup around the heart.  Thickening of the heart muscle.  A tumor or infectious growth around the heart valves. Tell a health care provider about:  Any allergies you have.  All medicines you are taking, including vitamins, herbs, eye drops, creams, and over-the-counter medicines.  Any blood disorders you have.  Any surgeries you have had.  Any medical conditions you have.  Whether you are pregnant or may be pregnant. What are the risks? Generally, this is a safe procedure. However, problems may occur, including:  Allergic reaction to dye (contrast) that may be used  during the procedure. What happens before the procedure? No specific preparation is needed. You may eat and drink normally. What happens during the procedure?   An IV tube may be inserted into one of your veins.  You may receive contrast through this tube. A contrast is an injection that improves the quality of the pictures from your heart.  A gel will be applied to your chest.  A wand-like tool (transducer) will be moved over your chest. The gel will help to transmit the sound waves from the transducer.  The sound waves will harmlessly bounce off of your heart to allow the heart images to be captured in real-time motion. The images will be recorded on a computer. The procedure may vary among health care providers and hospitals. What happens after the procedure?  You may return to your normal, everyday life, including diet, activities, and medicines, unless your health care provider tells you not to do that. Summary  An echocardiogram is a procedure that uses painless sound waves (ultrasound) to produce an image  of the heart.  Images from an echocardiogram can provide important information about the size and shape of your heart, heart muscle function, heart valve function, and fluid buildup around your heart.  You do not need to do anything to prepare before this procedure. You may eat and drink normally.  After the echocardiogram is completed, you may return to your normal, everyday life, unless your health care provider tells you not to do that. This information is not intended to replace advice given to you by your health care provider. Make sure you discuss any questions you have with your health care provider. Document Revised: 10/24/2018 Document Reviewed: 08/05/2016 Elsevier Patient Education  2020 [employer].

## 2019-09-11 NOTE — Progress Notes (Signed)
Cardiology Office Note   Date:  09/11/2019   ID:  [name], [name] Oct 24, 1990, MRN [id number]  PCP:  Frederico Hamman, MD  Cardiologist:   Skeet Latch, MD   No chief complaint on file.    History of Present Illness: Jill Reed is a 29 y.o. pregnant female with sickle cell trait and asthma who is being seen today for the evaluation of irregular heart beat and chest pain at the request of Frederico Hamman, MD.  Jill Reed repots having chest pain off an on for years.  She saw Dr Gwenlyn Found 05/2012 with volume overload in the postpartum period.  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  However, echo at that time revealed normal systolic function.  She receive IV lasix with improvement in symptoms.  She had some chest pain last year that was associated with R arm tingling and numbness but no exertion.  Lately she has been experiencing constant pain in her chest and ribs that occurs at rest.  She feels a sharp pain like a knife when laying in bed.  It gets a little better with movement.  The pain is 10/10 in severity.  It does not change with deep inspiration.  She hasn't experienced LE edema lately.  The symptoms are associated with shortness of breath but no orthopnea or PND. She notices that when she goes up and down steps she gets very tired and short of breath.  She continues to smoke to packs of cigarettes daily.  She denies recent syncope but did pass out as a child.    Past Medical History:  Diagnosis Date  . Anxiety   . Asthma   . Depression   . Irregular heart beat   . Sickle cell trait [facility])     Past Surgical History:  Procedure Laterality Date  . NO PAST SURGERIES       Current Outpatient Medications  Medication Sig Dispense Refill  . albuterol (PROVENTIL HFA;VENTOLIN HFA) 108 (90 Base) MCG/ACT inhaler Inhale 1-2 puffs into the lungs every 6 (six) hours as needed for wheezing or shortness of breath.    . Blood Pressure Monitoring (BLOOD PRESSURE  KIT) DEVI 1 kit by Does not apply route once a week. Check Blood Pressure regularly and record readings into the Babyscripts App.  Large Cuff.  DX O90.0 1 each 0  . Prenat-Methylfol-Chol-Fish Oil (PRENATAL + COMPLETE MULTI) 0.267 & 373 MG THPK Take 1 tablet by mouth daily. 30 each 11  . promethazine (PHENERGAN) 25 MG tablet Take 0.5-1 tablets (12.5-25 mg total) by mouth every 6 (six) hours as needed for nausea or vomiting. 30 tablet 0   No current facility-administered medications for this visit.    Allergies:   Carrot [daucus carota], Mushroom extract complex, and Peach [prunus persica]    Social History:  The patient  reports that she quit smoking about 7 years ago. Her smoking use included cigarettes. She has never used smokeless tobacco. She reports that she does not drink alcohol or use drugs.   Family History:  The patient's family history includes ADD / ADHD in her paternal uncle; Arthritis in her maternal grandmother and mother; Breast cancer in her maternal aunt; Diabetes in her paternal grandmother; Hypertension in her mother; Sickle cell anemia in her father; Stroke in her paternal grandmother.    ROS:  Please see the history of present illness.   Otherwise, review of systems are positive for none.   All other systems are reviewed  and negative.    PHYSICAL EXAM: VS:  BP 115/73   Pulse 72   Ht '5\' 2"'  (1.575 m)   Wt 141 lb (64 kg)   LMP 06/17/2019   SpO2 100%   BMI 25.79 kg/m  , BMI Body mass index is 25.79 kg/m. GENERAL:  Well appearing HEENT:  Pupils equal round and reactive, fundi not visualized, oral mucosa unremarkable NECK:  No jugular venous distention, waveform within normal limits, carotid upstroke brisk and symmetric, no bruits, no thyromegaly LYMPHATICS:  No cervical adenopathy LUNGS:  Clear to auscultation bilaterally HEART:  RRR.  PMI not displaced or sustained,S1 and S2 within normal limits, no S3, no S4, no clicks, no rubs, no murmurs ABD:  Flat, positive  bowel sounds normal in frequency in pitch, no bruits, no rebound, no guarding, no midline pulsatile mass, no hepatomegaly, no splenomegaly EXT:  2 plus pulses throughout, no edema, no cyanosis no clubbing SKIN:  No rashes no nodules NEURO:  Cranial nerves II through XII grossly intact, motor grossly intact throughout PSYCH:  Cognitively intact, oriented to person place and time   EKG:  EKG is ordered today. The ekg ordered today demonstrates sinus rhythm.  Sinus arrhythmia.  Rate 72 bpm.   Recent Labs: 09/05/2019: Hemoglobin 12.9; Platelets 219    Lipid Panel No results found for: CHOL, TRIG, HDL, CHOLHDL, VLDL, LDLCALC, LDLDIRECT    Wt Readings from Last 3 Encounters:  09/11/19 141 lb (64 kg)  09/05/19 141 lb (64 kg)  08/22/19 142 lb 12.8 oz (64.8 kg)      ASSESSMENT AND PLAN:  # Atypical chest pain: # Shortness of breath: Jill Reed's chest pain is very atypical and seems to be more likely GERD than ischemia.  I'm more concerned with her exertional dyspnea.  This could be related to tobacco abuse or pregnancy.  We will get an ETT to evaluate for ischemia.  Check ESR to evaluate for pericarditis.  We will also get an echo to evaluate her shortness of breath.  # Tobacco abuse:  Advised cessation, especially given her pregnancy.  She wants to quit but is not ready at this time.    Current medicines are reviewed at length with the patient today.  The patient does not have concerns regarding medicines.  The following changes have been made:  no change  Labs/ tests ordered today include:  No orders of the defined types were placed in this encounter.    Disposition:   FU with Taeko Schaffer C. Oval Linsey, MD, [facility] as needed.     Signed, Kiersten Coss C. Oval Linsey, MD, [facility]  09/11/2019 11:05 AM    [place]

## 2019-09-15 ENCOUNTER — Other Ambulatory Visit (INDEPENDENT_AMBULATORY_CARE_PROVIDER_SITE_OTHER): Payer: Medicaid Other

## 2019-09-15 DIAGNOSIS — R079 Chest pain, unspecified: Secondary | ICD-10-CM

## 2019-09-23 ENCOUNTER — Telehealth (HOSPITAL_COMMUNITY): Payer: Self-pay

## 2019-09-23 ENCOUNTER — Encounter: Payer: Self-pay | Admitting: Cardiovascular Disease

## 2019-09-23 ENCOUNTER — Other Ambulatory Visit (HOSPITAL_COMMUNITY)
Admission: RE | Admit: 2019-09-23 | Discharge: 2019-09-23 | Disposition: A | Discharge status: Home / Self Care | Payer: Medicaid Other | Source: Ambulatory Visit | Attending: Cardiovascular Disease | Admitting: Cardiovascular Disease

## 2019-09-23 DIAGNOSIS — Z20822 Contact with and (suspected) exposure to covid-19: Secondary | ICD-10-CM | POA: Insufficient documentation

## 2019-09-23 DIAGNOSIS — R0789 Other chest pain: Secondary | ICD-10-CM | POA: Insufficient documentation

## 2019-09-23 DIAGNOSIS — Z01812 Encounter for preprocedural laboratory examination: Secondary | ICD-10-CM | POA: Insufficient documentation

## 2019-09-23 DIAGNOSIS — Z720 Tobacco use: Secondary | ICD-10-CM | POA: Insufficient documentation

## 2019-09-23 DIAGNOSIS — R0602 Shortness of breath: Secondary | ICD-10-CM

## 2019-09-23 HISTORY — DX: Other chest pain: R07.89

## 2019-09-23 HISTORY — DX: Tobacco use: Z72.0

## 2019-09-23 HISTORY — DX: Shortness of breath: R06.02

## 2019-09-23 LAB — SARS CORONAVIRUS 2 (TAT 6-24 HRS): SARS Coronavirus 2: NEGATIVE

## 2019-09-23 NOTE — Telephone Encounter (Signed)
Encounter complete. 

## 2019-09-24 ENCOUNTER — Telehealth (HOSPITAL_COMMUNITY): Payer: Self-pay | Admitting: *Deleted

## 2019-09-24 NOTE — Telephone Encounter (Signed)
Close encounter 

## 2019-09-25 DIAGNOSIS — O09299 Supervision of pregnancy with other poor reproductive or obstetric history, unspecified trimester: Secondary | ICD-10-CM | POA: Insufficient documentation

## 2019-09-26 ENCOUNTER — Telehealth: Payer: Self-pay | Admitting: Family Medicine

## 2019-09-26 ENCOUNTER — Ambulatory Visit (HOSPITAL_COMMUNITY)
Admission: RE | Admit: 2019-09-26 | Payer: Medicaid Other | Source: Ambulatory Visit | Attending: Cardiovascular Disease | Admitting: Cardiovascular Disease

## 2019-09-26 MED ORDER — ASPIRIN 81 MG PO CHEW: 81.0000 mg | CHEWABLE_TABLET | Freq: Every day | ORAL | 8 refills | Status: DC

## 2019-09-26 NOTE — Telephone Encounter (Signed)
Telephone call to patient regarding need to start daily baby aspirin.  Patient was not in, left message for call back.  Rx routed to pharmacy.  Will also MyChart message to patient.

## 2019-09-29 ENCOUNTER — Other Ambulatory Visit (HOSPITAL_COMMUNITY): Payer: Medicaid Other

## 2019-10-03 ENCOUNTER — Ambulatory Visit (INDEPENDENT_AMBULATORY_CARE_PROVIDER_SITE_OTHER): Payer: Medicaid Other | Admitting: Obstetrics and Gynecology

## 2019-10-03 ENCOUNTER — Other Ambulatory Visit: Payer: Self-pay

## 2019-10-03 ENCOUNTER — Encounter: Payer: Self-pay | Admitting: Obstetrics and Gynecology

## 2019-10-03 VITALS — BP 103/68 | HR 82 | Wt 141.0 lb

## 2019-10-03 DIAGNOSIS — O09211 Supervision of pregnancy with history of pre-term labor, first trimester: Secondary | ICD-10-CM

## 2019-10-03 DIAGNOSIS — O0990 Supervision of high risk pregnancy, unspecified, unspecified trimester: Secondary | ICD-10-CM

## 2019-10-03 DIAGNOSIS — J45909 Unspecified asthma, uncomplicated: Secondary | ICD-10-CM

## 2019-10-03 DIAGNOSIS — O09291 Supervision of pregnancy with other poor reproductive or obstetric history, first trimester: Secondary | ICD-10-CM

## 2019-10-03 DIAGNOSIS — D573 Sickle-cell trait: Secondary | ICD-10-CM

## 2019-10-03 DIAGNOSIS — O99011 Anemia complicating pregnancy, first trimester: Secondary | ICD-10-CM

## 2019-10-03 DIAGNOSIS — O09299 Supervision of pregnancy with other poor reproductive or obstetric history, unspecified trimester: Secondary | ICD-10-CM

## 2019-10-03 DIAGNOSIS — Z3A13 13 weeks gestation of pregnancy: Secondary | ICD-10-CM

## 2019-10-03 DIAGNOSIS — O99511 Diseases of the respiratory system complicating pregnancy, first trimester: Secondary | ICD-10-CM

## 2019-10-03 DIAGNOSIS — O361910 Maternal care for other isoimmunization, first trimester, not applicable or unspecified: Secondary | ICD-10-CM

## 2019-10-03 MED ORDER — PRENATE PIXIE 10-0.6-0.4-200 MG PO CAPS: 1.0000 | ORAL_CAPSULE | Freq: Every day | ORAL | 11 refills | Status: DC

## 2019-10-03 NOTE — Progress Notes (Signed)
Subjective:  Jill Reed is a 29 y.o. G2P0101 at [redacted]w[redacted]d being seen today for ongoing prenatal care.  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  Patient reports no complaints.  Contractions: Not present. Vag. Bleeding: None.   . Denies leaking of fluid.   The following portions of the patient's history were reviewed and updated as appropriate: allergies, current medications, past family history, past medical history, past social history, past surgical history and problem list. Problem list updated.  Objective:   Vitals:   10/03/19 0920  BP: 103/68  Pulse: 82  Weight: 141 lb (64 kg)    Fetal Status: Fetal Heart Rate (bpm): 145         General:  Alert, oriented and cooperative. Patient is in no acute distress.  Skin: Skin is warm and dry. No rash noted.   Cardiovascular: Normal heart rate noted  Respiratory: Normal respiratory effort, no problems with respiration noted  Abdomen: Soft, gravid, appropriate for gestational age. Pain/Pressure: Absent     Pelvic:  Cervical exam deferred        Extremities: Normal range of motion.     Mental Status: Normal mood and affect. Normal behavior. Normal judgment and thought content.   Urinalysis:      Assessment and Plan:  Pregnancy: G2P0101 at [redacted]w[redacted]d  1. Supervision of high risk pregnancy, antepartum Stable Anatomy scan ordered - Prenat-FeAsp-Meth-FA-DHA w/o A (PRENATE PIXIE) 10-0.6-0.4-200 MG CAPS; Take 1 capsule by mouth daily.  Dispense: 30 capsule; Refill: 11 - Enroll Patient in Babyscripts - Genetic Screening - US MFM OB COMP +  14 WK; Future  2. History of preterm labor, current pregnancy, first trimester Declines 17 OHP  3. History of pre-eclampsia in prior pregnancy, currently pregnant Pt reports normal BP at home Will continue to monitor. Start qd BASA - Comprehensive metabolic panel - Protein / creatinine ratio, urine  4. Asthma affecting pregnancy in first trimester Stable PRN MDI  5. Sickle cell trait (HCC) Stable  6. 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 Please refer to After Visit Summary for other counseling recommendations.  Return in about 4 weeks (around 10/31/2019) for OB visit, virtual, MD provider.   Hermina Staggers, MD

## 2019-10-03 NOTE — Progress Notes (Signed)
Pt is interested in Genetic screening today. Pt declines to start 17p injections.

## 2019-10-03 NOTE — Addendum Note (Signed)
Addended by: Thom Chimes on: 10/03/2019 10:28 AM   Modules accepted: Orders

## 2019-10-03 NOTE — Patient Instructions (Signed)

## 2019-10-04 LAB — COMPREHENSIVE METABOLIC PANEL
ALT: 12 IU/L (ref 0–32)
AST: 14 IU/L (ref 0–40)
Albumin/Globulin Ratio: 1.4 (ref 1.2–2.2)
Albumin: 4.1 g/dL (ref 3.9–5.0)
Alkaline Phosphatase: 49 IU/L (ref 39–117)
BUN/Creatinine Ratio: 9 (ref 9–23)
BUN: 5 mg/dL — ABNORMAL LOW (ref 6–20)
Bilirubin Total: 0.5 mg/dL (ref 0.0–1.2)
CO2: 20 mmol/L (ref 20–29)
Calcium: 9.6 mg/dL (ref 8.7–10.2)
Chloride: 100 mmol/L (ref 96–106)
Creatinine, Ser: 0.54 mg/dL — ABNORMAL LOW (ref 0.57–1.00)
GFR calc Af Amer: 146 mL/min/{1.73_m2} (ref >59)
GFR calc non Af Amer: 127 mL/min/{1.73_m2} (ref >59)
Globulin, Total: 2.9 g/dL (ref 1.5–4.5)
Glucose: 89 mg/dL (ref 65–99)
Potassium: 3.8 mmol/L (ref 3.5–5.2)
Sodium: 137 mmol/L (ref 134–144)
Total Protein: 7 g/dL (ref 6.0–8.5)

## 2019-10-04 LAB — PROTEIN / CREATININE RATIO, URINE
Creatinine, Urine: 148.7 mg/dL
Protein, Ur: 18.3 mg/dL
Protein/Creat Ratio: 123 mg/g creat (ref 0–200)

## 2019-10-09 ENCOUNTER — Telehealth (HOSPITAL_COMMUNITY): Payer: Self-pay

## 2019-10-09 NOTE — Telephone Encounter (Signed)
Encounter complete. 

## 2019-10-11 ENCOUNTER — Other Ambulatory Visit (HOSPITAL_COMMUNITY): Payer: Medicaid Other

## 2019-10-14 ENCOUNTER — Encounter: Payer: Self-pay | Admitting: Obstetrics and Gynecology

## 2019-10-14 ENCOUNTER — Encounter (HOSPITAL_COMMUNITY): Payer: Self-pay | Admitting: Cardiovascular Disease

## 2019-10-15 ENCOUNTER — Ambulatory Visit (HOSPITAL_COMMUNITY)
Admission: RE | Admit: 2019-10-15 | Payer: Medicaid Other | Source: Ambulatory Visit | Attending: Cardiovascular Disease | Admitting: Cardiovascular Disease

## 2019-10-20 ENCOUNTER — Encounter: Payer: Self-pay | Admitting: Obstetrics and Gynecology

## 2019-10-20 ENCOUNTER — Other Ambulatory Visit: Payer: Self-pay | Admitting: *Deleted

## 2019-10-20 DIAGNOSIS — O0990 Supervision of high risk pregnancy, unspecified, unspecified trimester: Secondary | ICD-10-CM

## 2019-10-20 DIAGNOSIS — O285 Abnormal chromosomal and genetic finding on antenatal screening of mother: Secondary | ICD-10-CM

## 2019-10-20 NOTE — Progress Notes (Signed)
Orders for MFM referral due to abn [facility] screen.

## 2019-10-22 ENCOUNTER — Encounter (HOSPITAL_COMMUNITY): Payer: Self-pay | Admitting: Obstetrics and Gynecology

## 2019-10-22 ENCOUNTER — Telehealth: Payer: Self-pay | Admitting: Cardiovascular Disease

## 2019-10-22 ENCOUNTER — Other Ambulatory Visit: Payer: Self-pay

## 2019-10-22 ENCOUNTER — Inpatient Hospital Stay (HOSPITAL_COMMUNITY)
Admission: AD | Admit: 2019-10-22 | Discharge: 2019-10-22 | Disposition: A | Discharge status: Home / Self Care | Payer: Medicaid Other | Attending: Obstetrics and Gynecology | Admitting: Obstetrics and Gynecology

## 2019-10-22 DIAGNOSIS — Z87891 Personal history of nicotine dependence: Secondary | ICD-10-CM | POA: Diagnosis not present

## 2019-10-22 DIAGNOSIS — Z3A15 15 weeks gestation of pregnancy: Secondary | ICD-10-CM

## 2019-10-22 DIAGNOSIS — Z79899 Other long term (current) drug therapy: Secondary | ICD-10-CM | POA: Insufficient documentation

## 2019-10-22 DIAGNOSIS — J45909 Unspecified asthma, uncomplicated: Secondary | ICD-10-CM | POA: Diagnosis not present

## 2019-10-22 DIAGNOSIS — R109 Unspecified abdominal pain: Secondary | ICD-10-CM | POA: Diagnosis not present

## 2019-10-22 DIAGNOSIS — N760 Acute vaginitis: Secondary | ICD-10-CM | POA: Diagnosis not present

## 2019-10-22 DIAGNOSIS — Z7982 Long term (current) use of aspirin: Secondary | ICD-10-CM | POA: Diagnosis not present

## 2019-10-22 DIAGNOSIS — O26892 Other specified pregnancy related conditions, second trimester: Secondary | ICD-10-CM | POA: Diagnosis not present

## 2019-10-22 DIAGNOSIS — Z560 Unemployment, unspecified: Secondary | ICD-10-CM | POA: Diagnosis not present

## 2019-10-22 DIAGNOSIS — D573 Sickle-cell trait: Secondary | ICD-10-CM | POA: Insufficient documentation

## 2019-10-22 DIAGNOSIS — O23592 Infection of other part of genital tract in pregnancy, second trimester: Secondary | ICD-10-CM | POA: Insufficient documentation

## 2019-10-22 DIAGNOSIS — O99012 Anemia complicating pregnancy, second trimester: Secondary | ICD-10-CM | POA: Diagnosis not present

## 2019-10-22 DIAGNOSIS — O99512 Diseases of the respiratory system complicating pregnancy, second trimester: Secondary | ICD-10-CM | POA: Insufficient documentation

## 2019-10-22 DIAGNOSIS — O26899 Other specified pregnancy related conditions, unspecified trimester: Secondary | ICD-10-CM

## 2019-10-22 LAB — URINALYSIS, ROUTINE W REFLEX MICROSCOPIC
Bilirubin Urine: NEGATIVE
Glucose, UA: NEGATIVE mg/dL
Hgb urine dipstick: NEGATIVE
Ketones, ur: NEGATIVE mg/dL
Leukocytes,Ua: NEGATIVE
Nitrite: NEGATIVE
Protein, ur: NEGATIVE mg/dL
Specific Gravity, Urine: 1.016 (ref 1.005–1.030)
pH: 6 (ref 5.0–8.0)

## 2019-10-22 LAB — WET PREP, GENITAL
Sperm: NONE SEEN
Trich, Wet Prep: NONE SEEN
Yeast Wet Prep HPF POC: NONE SEEN

## 2019-10-22 MED ORDER — OXYCODONE-ACETAMINOPHEN 5-325 MG PO TABS
2.0000 | ORAL_TABLET | Freq: Once | ORAL | Status: AC
  Administered 2019-10-22: 2 via ORAL
  Filled 2019-10-22: qty 2

## 2019-10-22 MED ORDER — COMFORT FIT MATERNITY SUPP MED MISC: 1.0000 | Freq: Every day | 0 refills | Status: DC

## 2019-10-22 MED ORDER — SULFAMETHOXAZOLE-TRIMETHOPRIM 800-160 MG PO TABS: 1.0000 | ORAL_TABLET | Freq: Two times a day (BID) | ORAL | 0 refills | Status: AC

## 2019-10-22 MED ORDER — TRAMADOL HCL 50 MG PO TABS: 50.0000 mg | ORAL_TABLET | Freq: Four times a day (QID) | ORAL | 0 refills | Status: DC | PRN

## 2019-10-22 MED ORDER — LIDOCAINE HCL (PF) 1 % IJ SOLN
30.0000 mL | Freq: Once | INTRAMUSCULAR | Status: AC
  Administered 2019-10-22: 30 mL via INTRADERMAL
  Filled 2019-10-22: qty 30

## 2019-10-22 NOTE — MAU Note (Signed)
Pt reports she had some amoxicillin left from a previous MAU visit, she had 2 left so she has been taking those.

## 2019-10-22 NOTE — MAU Note (Signed)
.   Jill Reed is a 29 y.o. at [redacted]w[redacted]d here in MAU reporting: sharp pain in her lower abdomen that started 4-5 days ago. Pt states that she has a cyst on the right side of her panty line. Denies any vaginal bleeding  Onset of complaint: 4-5 days ago Pain score: 8 Vitals:   10/22/19 0937  BP: 119/70  Pulse: 87  Resp: 16  Temp: 98.3 F (36.8 C)  SpO2: 100%     FHT:145 Lab orders placed from triage: UA

## 2019-10-22 NOTE — Discharge Instructions (Signed)
Try "padsicles" with witch hazel and aloe vera gel in a maxi pad. Wrap in foil or put in plastic bag in the freezer.

## 2019-10-22 NOTE — Telephone Encounter (Signed)
New message:    Patient calling to ask when do she need to get covid-19 test.

## 2019-10-22 NOTE — Telephone Encounter (Signed)
Called and spoke with pt, notified that her covid test is on 10/27/19 at 2:40pm Pt verbalized understanding. She is also requesting that she have Estill Bamberg CNM as her midwife, she states that she saw her today and really liked her. States it is at the [place] office on [address]. Notified pt that I was not sure if we could make that request, advised her to contact her primary OBGYN. Pt states that she does not have a pimary OBGYN and it changes based on who is available at that time.  Notified I would route to Dr.[place]s Branch for advice. Pt verbalized understanding. No other questions at this time.

## 2019-10-22 NOTE — Telephone Encounter (Signed)
Spoke with patient and all she needed was her COVID information. She is having COVID screening prior to her ETT

## 2019-10-22 NOTE — MAU Provider Note (Signed)
Chief Complaint: Abdominal Pain and Bartholin's Cyst   First Provider Initiated Contact with Patient 10/22/19 1028      SUBJECTIVE HPI: Jill Reed is a 29 y.o. G2P0101 at 58w6dwho presents to maternity admissions reporting pain in her left lower abdomen when walking or standing up from a chair or the car and a painful cyst on her labia. She reports the pain in her abdomen is sharp and intermittent. It does radiate down into her left thigh.  She also reports a bump on her right labia that started 2-3 days ago and has progressed into a large boil that is painful and makes walking difficult. She has hx of boils in the groin area but never on the labia.  There are no other symptoms. She has not tried any treatments.     HPI  Past Medical History:  Diagnosis Date  . Anxiety   . Asthma   . Atypical chest pain 09/23/2019  . Atypical chest pain 09/23/2019  . Depression   . Irregular heart beat   . Shortness of breath 09/23/2019  . Shortness of breath 09/23/2019  . Sickle cell trait ([place]   . Tobacco abuse 09/23/2019   Past Surgical History:  Procedure Laterality Date  . NO PAST SURGERIES     Social History   Socioeconomic History  . Marital status: Single    Spouse name: Not on file  . Number of children: 1  . Years of education: Not on file  . Highest education level: Not on file  Occupational History  . Occupation: unemployed  Tobacco Use  . Smoking status: Former Smoker    Types: Cigarettes    Quit date: 10/17/2011    Years since quitting: 8.0  . Smokeless tobacco: Never Used  Substance and Sexual Activity  . Alcohol use: No  . Drug use: No  . Sexual activity: Yes    Birth control/protection: None  Other Topics Concern  . Not on file  Social History Narrative  . Not on file   Social Determinants of Health   Financial Resource Strain:   . Difficulty of Paying Living Expenses:   Food Insecurity:   . Worried About RCharity fundraiserin the Last Year:   . RAcademic librarianin the Last Year:   Transportation Needs:   . LFilm/video editor(Medical):   .Marland KitchenLack of Transportation (Non-Medical):   Physical Activity:   . Days of Exercise per Week:   . Minutes of Exercise per Session:   Stress:   . Feeling of Stress :   Social Connections:   . Frequency of Communication with Friends and Family:   . Frequency of Social Gatherings with Friends and Family:   . Attends Religious Services:   . Active Member of Clubs or Organizations:   . Attends CArchivistMeetings:   .Marland KitchenMarital Status:   Intimate Partner Violence:   . Fear of Current or Ex-Partner:   . Emotionally Abused:   .Marland KitchenPhysically Abused:   . Sexually Abused:    No current facility-administered medications on file prior to encounter.   Current Outpatient Medications on File Prior to Encounter  Medication Sig Dispense Refill  . albuterol (PROVENTIL HFA;VENTOLIN HFA) 108 (90 Base) MCG/ACT inhaler Inhale 1-2 puffs into the lungs every 6 (six) hours as needed for wheezing or shortness of breath.    .Marland Kitchenaspirin 81 MG chewable tablet Chew 1 tablet (81 mg total) by mouth daily. 30 tablet  8  . Blood Pressure Monitoring (BLOOD PRESSURE KIT) DEVI 1 kit by Does not apply route once a week. Check Blood Pressure regularly and record readings into the [employer] App.  Large Cuff.  DX O90.0 1 each 0  . Prenat-FeAsp-Meth-FA-DHA w/o A (PRENATE PIXIE) 10-0.6-0.4-200 MG CAPS Take 1 capsule by mouth daily. 30 capsule 11  . Prenat-Methylfol-Chol-Fish Oil (PRENATAL + COMPLETE MULTI) 0.267 & 373 MG THPK Take 1 tablet by mouth daily. 30 each 11  . promethazine (PHENERGAN) 25 MG tablet Take 0.5-1 tablets (12.5-25 mg total) by mouth every 6 (six) hours as needed for nausea or vomiting. 30 tablet 0   Allergies  Allergen Reactions  . Carrot [Daucus Carota] Itching and Rash  . Mushroom Extract Complex Itching and Rash  . Peach [Prunus Persica] Itching and Rash    ROS:  Review of Systems  Constitutional: Negative  for chills and fever.  Respiratory: Negative for cough and shortness of breath.   Cardiovascular: Negative for chest pain.  Gastrointestinal: Positive for abdominal pain. Negative for nausea and vomiting.  Genitourinary: Positive for pelvic pain and vaginal pain. Negative for dysuria, frequency and urgency.  Musculoskeletal: Positive for back pain.  Neurological: Negative for dizziness and headaches.     I have reviewed patient's Past Medical Hx, Surgical Hx, Family Hx, Social Hx, medications and allergies.   Physical Exam   Patient Vitals for the past 24 hrs:  BP Temp Pulse Resp SpO2 Height Weight  10/22/19 1301 (!) 115/55 -- 80 -- -- -- --  10/22/19 0937 119/70 98.3 F (36.8 C) 87 16 100 % '5\' 2"'  (1.575 m) 63 kg   Constitutional: Well-developed, well-nourished female in no acute distress.  Cardiovascular: normal rate Respiratory: normal effort GI: Abd soft, non-tender. Pos BS x 4 MS: Extremities nontender, no edema, normal ROM Neurologic: Alert and oriented x 4.  GU: Neg CVAT.  Dilation: Closed Effacement (%): Thick Cervical Position: Posterior Exam by:: Fatima Blank, CNM   FHT 145 by doppler  INCISION AND DRAINAGE Performed by: Fatima Blank Consent: Verbal consent obtained. Risks and benefits: risks, benefits and alternatives were discussed Time out performed prior to procedure Type: abscess Body area: right posterior labia Anesthesia: local infiltration Incision was made with a scalpel. Local anesthetic: lidocaine 1%  Anesthetic total: 2 ml Complexity: complex Blunt dissection to break up loculations Drainage: purulent Drainage amount: ~ 20 ml Packing material: N/a Patient tolerance: Patient tolerated the procedure well with no immediate complications.  LAB RESULTS Results for orders placed or performed during the hospital encounter of 10/22/19 (from the past 24 hour(s))  Urinalysis, Routine w reflex microscopic     Status: Abnormal    Collection Time: 10/22/19 10:15 AM  Result Value Ref Range   Color, Urine YELLOW YELLOW   APPearance HAZY (A) CLEAR   Specific Gravity, Urine 1.016 1.005 - 1.030   pH 6.0 5.0 - 8.0   Glucose, UA NEGATIVE NEGATIVE mg/dL   Hgb urine dipstick NEGATIVE NEGATIVE   Bilirubin Urine NEGATIVE NEGATIVE   Ketones, ur NEGATIVE NEGATIVE mg/dL   Protein, ur NEGATIVE NEGATIVE mg/dL   Nitrite NEGATIVE NEGATIVE   Leukocytes,Ua NEGATIVE NEGATIVE  Wet prep, genital     Status: Abnormal   Collection Time: 10/22/19 11:10 AM   Specimen: Genital  Result Value Ref Range   Yeast Wet Prep HPF POC NONE SEEN NONE SEEN   Trich, Wet Prep NONE SEEN NONE SEEN   Clue Cells Wet Prep HPF POC PRESENT (A) NONE SEEN  WBC, Wet Prep HPF POC MANY (A) NONE SEEN   Sperm NONE SEEN     A/Positive/-- (02/19 1021)  IMAGING No results found.  MAU Management/MDM: Orders Placed This Encounter  Procedures  . Wet prep, genital  . Urinalysis, Routine w reflex microscopic  . Discharge patient    Meds ordered this encounter  Medications  . oxyCODONE-acetaminophen (PERCOCET/ROXICET) 5-325 MG per tablet 2 tablet  . lidocaine (PF) (XYLOCAINE) 1 % injection 30 mL  . sulfamethoxazole-trimethoprim (BACTRIM DS) 800-160 MG tablet    Sig: Take 1 tablet by mouth 2 (two) times daily for 7 days.    Dispense:  14 tablet    Refill:  0    Order Specific Question:   Supervising Provider    Answer:   Aletha Halim [id number]  . traMADol (ULTRAM) 50 MG tablet    Sig: Take 1 tablet (50 mg total) by mouth every 6 (six) hours as needed.    Dispense:  6 tablet    Refill:  0    Order Specific Question:   Supervising Provider    Answer:   Aletha Halim [id number]  . Elastic Bandages & Supports (COMFORT FIT MATERNITY SUPP MED) MISC    Sig: 1 Device by Does not apply route daily.    Dispense:  1 each    Refill:  0    Order Specific Question:   Supervising Provider    Answer:   Aletha Halim [[id number]]    No evidence of preterm  labor with closed cervix. Pain most c/w round ligament pain/musculoskeletal pain. Rest/ice/heat/warm bath/Tylenol/pregnancy support belt. Rx for support belt written.  Labial abscess drained in MAU, see above note.  Pt with significant improvement in pain.  Rx for Bactrim, Tramadol x 6 tabs only.  Rx for pregnancy support belt.  Pt to keep scheduled appts at [facility]. Return to MAU as needed for emergencies.    ASSESSMENT 1. Abscess of vagina   2. Abdominal pain affecting pregnancy     PLAN Discharge home Allergies as of 10/22/2019      Reactions   Carrot [daucus Carota] Itching, Rash   Mushroom Extract Complex Itching, Rash   Peach [prunus Persica] Itching, Rash      Medication List    TAKE these medications   albuterol 108 (90 Base) MCG/ACT inhaler Commonly known as: VENTOLIN HFA Inhale 1-2 puffs into the lungs every 6 (six) hours as needed for wheezing or shortness of breath.   aspirin 81 MG chewable tablet Chew 1 tablet (81 mg total) by mouth daily.   Blood Pressure Kit Devi 1 kit by Does not apply route once a week. Check Blood Pressure regularly and record readings into the [employer] App.  Large Cuff.  DX O90.0   Comfort Fit Maternity Supp Med Misc 1 Device by Does not apply route daily.   Prenatal + Complete Multi 0.267 & 373 MG Thpk Take 1 tablet by mouth daily.   Prenate Pixie 10-0.6-0.4-200 MG Caps Take 1 capsule by mouth daily.   promethazine 25 MG tablet Commonly known as: PHENERGAN Take 0.5-1 tablets (12.5-25 mg total) by mouth every 6 (six) hours as needed for nausea or vomiting.   sulfamethoxazole-trimethoprim 800-160 MG tablet Commonly known as: BACTRIM DS Take 1 tablet by mouth 2 (two) times daily for 7 days.   traMADol 50 MG tablet Commonly known as: ULTRAM Take 1 tablet (50 mg total) by mouth every 6 (six) hours as needed.      Follow-up Information  [place] Follow up.   Contact information: [address]  [place] [id number] [place] Certified Nurse-Midwife 10/22/2019  4:07 PM

## 2019-10-22 NOTE — Telephone Encounter (Signed)
Why is she having a COVID test?  For ETT?

## 2019-10-23 LAB — GC/CHLAMYDIA PROBE AMP (~~LOC~~) NOT AT ARMC
Chlamydia: NEGATIVE
Comment: NEGATIVE
Comment: NORMAL
Neisseria Gonorrhea: NEGATIVE

## 2019-10-24 ENCOUNTER — Telehealth (HOSPITAL_COMMUNITY): Payer: Self-pay | Admitting: Cardiovascular Disease

## 2019-10-24 NOTE — Telephone Encounter (Signed)
Just an FYI. We have made several attempts to contact this patient including sending a letter to schedule or reschedule their echocardiogram. We will be removing the patient from the echo WQ.  10/14/19 Mailbox full and unable to LVM will mail letter to call and schedule/LBW @ 9:54  10/01/19 [facility] to schedule ECHO @ 10:28/LBW  09/29/19 [facility] to schedule Echo @ 9:47/LBW       Thank you

## 2019-10-27 ENCOUNTER — Other Ambulatory Visit (HOSPITAL_COMMUNITY): Payer: Medicaid Other

## 2019-10-28 ENCOUNTER — Telehealth (HOSPITAL_COMMUNITY): Payer: Self-pay

## 2019-10-28 ENCOUNTER — Other Ambulatory Visit (HOSPITAL_COMMUNITY)
Admission: RE | Admit: 2019-10-28 | Discharge: 2019-10-28 | Disposition: A | Discharge status: Home / Self Care | Payer: Medicaid Other | Source: Ambulatory Visit | Attending: Cardiovascular Disease | Admitting: Cardiovascular Disease

## 2019-10-28 DIAGNOSIS — Z20822 Contact with and (suspected) exposure to covid-19: Secondary | ICD-10-CM | POA: Diagnosis not present

## 2019-10-28 DIAGNOSIS — Z01812 Encounter for preprocedural laboratory examination: Secondary | ICD-10-CM | POA: Diagnosis not present

## 2019-10-28 LAB — SARS CORONAVIRUS 2 (TAT 6-24 HRS): SARS Coronavirus 2: NEGATIVE

## 2019-10-28 NOTE — Telephone Encounter (Signed)
Encounter complete. 

## 2019-10-30 ENCOUNTER — Ambulatory Visit (HOSPITAL_COMMUNITY)
Admission: RE | Admit: 2019-10-30 | Discharge: 2019-10-30 | Disposition: A | Discharge status: Home / Self Care | Payer: Medicaid Other | Source: Ambulatory Visit | Attending: Cardiology | Admitting: Cardiology

## 2019-10-30 ENCOUNTER — Other Ambulatory Visit: Payer: Self-pay

## 2019-10-30 DIAGNOSIS — R079 Chest pain, unspecified: Secondary | ICD-10-CM

## 2019-10-30 LAB — EXERCISE TOLERANCE TEST
Estimated workload: 8.6 METS
Exercise duration (min): 7 min
Exercise duration (sec): 2 s
MPHR: 190 {beats}/min
Peak HR: 162 {beats}/min
Percent HR: 85 %
RPE: 17
Rest HR: 75 {beats}/min

## 2019-10-31 ENCOUNTER — Telehealth (INDEPENDENT_AMBULATORY_CARE_PROVIDER_SITE_OTHER): Payer: Medicaid Other | Admitting: Obstetrics and Gynecology

## 2019-10-31 ENCOUNTER — Encounter: Payer: Self-pay | Admitting: Obstetrics and Gynecology

## 2019-10-31 DIAGNOSIS — Z3A17 17 weeks gestation of pregnancy: Secondary | ICD-10-CM

## 2019-10-31 DIAGNOSIS — O09212 Supervision of pregnancy with history of pre-term labor, second trimester: Secondary | ICD-10-CM

## 2019-10-31 DIAGNOSIS — O09211 Supervision of pregnancy with history of pre-term labor, first trimester: Secondary | ICD-10-CM

## 2019-10-31 DIAGNOSIS — O09299 Supervision of pregnancy with other poor reproductive or obstetric history, unspecified trimester: Secondary | ICD-10-CM

## 2019-10-31 DIAGNOSIS — O09292 Supervision of pregnancy with other poor reproductive or obstetric history, second trimester: Secondary | ICD-10-CM

## 2019-10-31 DIAGNOSIS — O0992 Supervision of high risk pregnancy, unspecified, second trimester: Secondary | ICD-10-CM

## 2019-10-31 DIAGNOSIS — O0990 Supervision of high risk pregnancy, unspecified, unspecified trimester: Secondary | ICD-10-CM

## 2019-10-31 NOTE — Progress Notes (Signed)
   PRENATAL VISIT NOTE  Subjective:  Jill Reed is a 29 y.o. G2P0101 at [redacted]w[redacted]d being seen today for ongoing prenatal care.  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  Patient reports no complaints.  Contractions: Not present. Vag. Bleeding: None.   . Denies leaking of fluid.   The following portions of the patient's history were reviewed and updated as appropriate: allergies, current medications, past family history, past medical history, past social history, past surgical history and problem list.   Objective:  There were no vitals filed for this visit.  Fetal Status:           General:  Alert, oriented and cooperative. Patient is in no acute distress.  Skin: Skin is warm and dry. No rash noted.   Cardiovascular: Normal heart rate noted  Respiratory: Normal respiratory effort, no problems with respiration noted  Abdomen: Soft, gravid, appropriate for gestational age.  Pain/Pressure: Present     Pelvic: Cervical exam deferred        Extremities: Normal range of motion.  Edema: None  Mental Status: Normal mood and affect. Normal behavior. Normal judgment and thought content.   Assessment and Plan:  Pregnancy: G2P0101 at [redacted]w[redacted]d 1. Supervision of high risk pregnancy, antepartum Patient is doing well without complaints Patient had exercise stress test on 4/15- cardiology report not yet available Anatomy ultrasound, AFP and genetic counseling on 4/28  2. History of pre-eclampsia in prior pregnancy, currently pregnant Patient unable to take BP as she is not home  3. History of  preterm labor, current pregnancy, first trimester Patient declined 17-P  Preterm labor symptoms and general obstetric precautions including but not limited to vaginal bleeding, contractions, leaking of fluid and fetal movement were reviewed in detail with the patient. Please refer to After Visit Summary for other counseling recommendations.   No follow-ups on file.  Future Appointments  Date Time Provider Department Center  10/31/2019  9:15 AM Chrissa Meetze, Gigi Gin, MD [facility] None  11/12/2019 11:00 AM [facility] NURSE [facility]  11/12/2019 11:00 AM [facility] [place] 3 [facility]  11/12/2019  1:00 PM [facility] GENETIC COUNSELING [facility]    Catalina Antigua, MD

## 2019-10-31 NOTE — Progress Notes (Signed)
ROB Virtual   CC: None   Pt unable to check B/P not at home at this time

## 2019-11-12 ENCOUNTER — Ambulatory Visit (HOSPITAL_COMMUNITY): Payer: Medicaid Other | Admitting: *Deleted

## 2019-11-12 ENCOUNTER — Other Ambulatory Visit: Payer: Medicaid Other

## 2019-11-12 ENCOUNTER — Other Ambulatory Visit: Payer: Self-pay

## 2019-11-12 ENCOUNTER — Encounter (HOSPITAL_COMMUNITY): Payer: Self-pay

## 2019-11-12 ENCOUNTER — Ambulatory Visit (HOSPITAL_COMMUNITY)
Admission: RE | Admit: 2019-11-12 | Discharge: 2019-11-12 | Disposition: A | Discharge status: Home / Self Care | Payer: Medicaid Other | Source: Ambulatory Visit | Attending: Obstetrics and Gynecology | Admitting: Obstetrics and Gynecology

## 2019-11-12 ENCOUNTER — Ambulatory Visit (HOSPITAL_BASED_OUTPATIENT_CLINIC_OR_DEPARTMENT_OTHER): Payer: Medicaid Other | Admitting: Genetic Counselor

## 2019-11-12 ENCOUNTER — Ambulatory Visit: Payer: Self-pay | Admitting: Genetic Counselor

## 2019-11-12 DIAGNOSIS — O09211 Supervision of pregnancy with history of pre-term labor, first trimester: Secondary | ICD-10-CM | POA: Diagnosis present

## 2019-11-12 DIAGNOSIS — Z363 Encounter for antenatal screening for malformations: Secondary | ICD-10-CM

## 2019-11-12 DIAGNOSIS — D573 Sickle-cell trait: Secondary | ICD-10-CM | POA: Diagnosis not present

## 2019-11-12 DIAGNOSIS — O0992 Supervision of high risk pregnancy, unspecified, second trimester: Secondary | ICD-10-CM

## 2019-11-12 DIAGNOSIS — Z315 Encounter for genetic counseling: Secondary | ICD-10-CM | POA: Diagnosis not present

## 2019-11-12 DIAGNOSIS — O0990 Supervision of high risk pregnancy, unspecified, unspecified trimester: Secondary | ICD-10-CM

## 2019-11-12 DIAGNOSIS — Z3A18 18 weeks gestation of pregnancy: Secondary | ICD-10-CM | POA: Diagnosis not present

## 2019-11-12 DIAGNOSIS — O361910 Maternal care for other isoimmunization, first trimester, not applicable or unspecified: Secondary | ICD-10-CM | POA: Diagnosis present

## 2019-11-12 DIAGNOSIS — O2692 Pregnancy related conditions, unspecified, second trimester: Secondary | ICD-10-CM

## 2019-11-12 NOTE — Progress Notes (Signed)
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 Jill Reed came to her appointment alone due to COVID-19 visitor restrictions.   Indication for genetic counseling - Sickle cell trait - Increased risk to be silent carrier for spinal muscular atrophy  Prenatal history  Jill Reed is a G23P0101, 29 y.o. female. Her current pregnancy has completed [redacted]w[redacted]d(Estimated Date of Delivery: 04/08/20). Jill Reed a s46year old son who was born preterm at 3103 weeks6 days. He does not have any medical or developmental concerns. This is her first pregnancy with her current partner.  Jill Reed exposure to environmental toxins or chemical agents. She denied the use of alcohol and street drugs. She quit smoking cigarettes on 08/27/19. She reported taking prenatal vitamins. She denied significant viral illnesses, fevers, and bleeding during the course of her pregnancy. Her medical and surgical histories were noncontributory.  Family History  A three generation pedigree was drafted and reviewed. The family history is remarkable for the following:  - Jill Reed's father has sickle cell disease. [name] carrier screening performed, which identified her as a carrier of sickle cell trait. This is consistent with her family history. See Discussion section for more details.   - Ms. ICapobiancohas a maternal half sister who has a two year old daughter with speech delays. We discussed that many times, developmental delays are multifactorial in nature, occurring due to a combination of genetic and environmental factors that are difficult to identify. Developmental delays can appear to run in families; thus, there is a chance that Jill Reed's children could also experience developmental delays of some kind. Jill Reed understands that she should make the pediatrician aware of any concerns she has about her children's development.  The remaining family histories were reviewed and found to be noncontributory for birth defects, intellectual disability, recurrent pregnancy loss, and known genetic conditions. [name] limited information about her partner's family history; thus, risk assessment was limited.  The patient's ethnicity is African American. The father of the pregnancy's ethnicity is African American. Ashkenazi Jewish ancestry and consanguinity were denied. Pedigree will be scanned under Media.  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 We discussed that SCA is one condition in a group of blood disorders that affect hemoglobin in red blood cells (hemoglobinopathies). Hemoglobin is a protein that transports oxygen from the lungs to organs and tissues throughout the body. Individuals with SCA have an inherited structural abnormality in hemoglobin's beta globin chains due to a single amino acid change in the HBB gene. Instead of producing normal adult hemoglobin (Hgb A), individuals with SCA produce an atypical form of hemoglobin called hemoglobin S (Hgb S). Typically, individuals are expected to have two copies of Hgb A (Hgb AA). Individuals who are carriers of SCA have one copy of Hgb A and one copy of Hgb S (Hgb AS), whereas individuals affected by SCA have two copies of Hgb S (Hgb SS). Carriers of SCA are often said to have sickle cell "trait".   Hgb S alters the configuration of the hemoglobin molecule. As a result, individuals with SCA have red blood cells that  can sickle and obstruct blood flow in small blood vessels, causing ischemia of tissues and organs and episodes of vaso-occlusive crisis. The amino acid change in the HBB  gene also causes red blood cells to become fragile and break down easily, which results in chronic anemia. Additional complications associated with SCA may include organ damage, frequent infections, acute chest syndrome, ischemic stroke, splenic sequestration, priapism, and pulmonary hypertension. SCA is inherited in an autosomal recessive pattern, where both parents must carry Hgb S trait to be at risk of having an affected child. Given his ethnicity, Jill Reed's partner has a 1 in 11 chance of carrying hemoglobin S trait. If Jill Reed's partner were also a carrier of SCA, they would have a 1 in 4 (25%) chance of having a child with SCA.    Hgb S is just one variant form of hemoglobin caused by a mutations in the HBB gene. It is also possible that Jill Reed's partner could carry another variant form of hemoglobin, such as hemoglobin C. Given his ethnicity, Jill Reed's partner has a 1 in 38 chance of carrying hemoglobin C trait. If he did, the couple would have a 1 in 4 (25%) chance of having a child with hemoglobin Culebra disease (HbSC disease). Individuals with HbSC disease have red blood cells that contain both hemoglobin S and hemoglobin C. These variant forms of hemoglobin can cause red blood cells to become rigid and sickle, blocking small blood vessels and making it difficult for the red blood cells to deliver oxygen to the body's tissues. This can cause severe pain and organ damage, just as we see in individuals with SCA. Individuals with HbSC disease are at risk of the same complications as those associated with SCA, such as pain crises, acute chest syndrome, infections, asplenia, and strokes; however, these complications may occur at a lesser frequency and may be milder.   Finally, Jill Reed's partner may have a different variant in the HBB gene that could make a carrier of beta-thalassemia. Given his ethnicity, Jill Reed's partner has a 1 in 75 chance of being a carrier for beta-thalassemia. If he is, the  couple would have a 1 in 4 (25%) chance of having a child with sickle beta thalassemia. The severity of sickle beta thalassemia depends on the normal amount of beta globin that is produced. If an individual produces no beta globin (sickle beta zero thalassemia), they will experience symptoms similar to SCA. If an individual produces a reduced amount of beta globin (sickle beta plus thalassemia), they may experience symptoms that are similar to a milder form of SCA.  Ms. Grunden was also found to have 2 copies of the SMN1 gene on Horizon-14 carrier screening; however, she also has the c.*3+80T>G polymorphism of SMN1 in intron 7 (also known as [id number]). This puts her at increased risk (1 in 34, or ~3%) to be a silent 2+0 carrier for SMA. SMA is a condition caused by mutations in the SMN1 gene. Typically, individuals have two copies of the SMN1 gene, with one copy present on each chromosome. In SMA silent carriers, both copies of the SMN1 gene are present on one chromosome, with no copies of SMN1 present on the other chromosome.   SMA is characterized by progressive muscle weakness and atrophy due to degeneration and loss of anterior horn cells (lower motor neurons) in the spinal cord and brain stem. We discussed the different types of SMA (0, I, II, and III), including differences in severity and age of  onset. We also reviewed the autosomal recessive inheritance pattern of SMA. We discussed that the couple only has a chance of having a child with SMA if both parents are identified as carriers for the condition. Based on the carrier frequency for SMA in the African American population, Ms. Peyser's partner currently has a 1 in 26 chance of being a carrier of SMA. Without partner screening to refine risk and based on her partner's ethnicity, the couple currently has a 1 in 8976 (0.01%) risk of having a child with SMA. If Ms. Pinch's partner were found to have 2 copies of SMN1, his risk of being a carrier is reduced  but not eliminated. However, if both parents are carriers of SMA, there is a 1 in 4 (25%) chance of having an affected fetus.   Ms. Pitz carrier screening was negative for the other 12 conditions screened. Thus, her risk to be a carrier for these additional conditions (listed separately in the laboratory report) has been reduced but not eliminated. This also significantly reduces her risk of having a child affected by one of these conditions. We discussed that carrier testing for hemoglobinopathies, beta-thalassemia, and SMA is recommended for Ms. Demuro's partner. Ms. Pilat indicated that she is interested in pursuing partner carrier screening.  We also reviewed that Ms. Nodarse had Panorama NIPS through the laboratory [facility] that was low-risk for fetal aneuploidies. We reviewed that these results showed a less than 1 in 10,000 risk for trisomies 21, 18 and 13, and monosomy X (Turner syndrome).  In addition, the risk for triploidy and sex chromosome trisomies (47,XXX and 47,XXY) was also low. Ms. 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 We reviewed that while this testing identifies 94-99% of pregnancies with trisomy 45, trisomy 4, trisomy 22, sex chromosome aneuploidies, and triploidy, it is NOT diagnostic. A positive test result requires confirmation by CVS or amniocentesis, and a negative test result does not rule out a fetal chromosome abnormality. She also understands that this testing does not identify all genetic conditions.  A complete ultrasound was performed today prior to our visit. The ultrasound report will be sent under separate cover. There were no visualized fetal anomalies or markers suggestive of aneuploidy.  Ms. Gorsline was also counseled regarding diagnostic testing via amniocentesis. We discussed the technical aspects of the procedure and quoted up to a 1 in 500 (0.2%) risk for spontaneous pregnancy loss or other adverse  pregnancy outcomes as a result of amniocentesis. Cultured cells from an amniocentesis sample allow for the visualization of a fetal karyotype, which can detect >99% of chromosomal aberrations. Chromosomal microarray can also be performed to identify smaller deletions or duplications of fetal chromosomal material. Amniocentesis could also be performed to assess whether the baby is affected by SCA, HbSC disease, sickle beta thalassemia, or SMA. After careful consideration, Ms. Birchard declined amniocentesis at this time. She understands that amniocentesis is available at any point after 16 weeks of pregnancy and that she may opt to undergo the procedure at a later date should she change her mind.  Ms. Haugen was interested in pursuing carrier screening for her partner, Wynell Balloon. However, Ms. Sher was unsure about his health insurance coverage.We discussed that ifMalik does not have insurance, I could potentially facilitate free testing through the laboratory [facility]'s Patient Assistance Program. Ms. Suminski I made a plan for herto email me once shehas had an opportunity to discuss carrier screening withher partner.Icansend him [employer]  kit andplace the orderfor testing once I hear from her.  I counseled Ms. Herskowitz regarding the above risks and available options. The approximate face-to-face time with the genetic counselor was 40 minutes.  In summary:  Discussed carrier screening results and options for follow-up testing  Sickle cell trait  Increased risk (1 in 34) to be silent carrier for spinal muscular atrophy  Desires partner carrier screening. She will contact me about her partner's insurance status and I will facilitate testing from there  Reviewed low-risk NIPS result  Reduction in risk for Down syndrome, trisomy 80, trisomy 74, triploidy, sex chromosome aneuploidies, and 22q11.2 deletion syndrome  Reviewed results of ultrasound  No fetal anomalies or markers seen  Reduction in  risk for fetal aneuploidy  Offered additional testing and screening  Declined amniocentesis  Reviewed family history concerns   Buelah Manis, MS, Counselling psychologist

## 2019-11-14 LAB — AFP, SERUM, OPEN SPINA BIFIDA
AFP MoM: 1.02
AFP Value: 59.3 ng/mL
Gest. Age on Collection Date: 18.9 weeks
Maternal Age At EDD: 30.8 yr
OSBR Risk 1 IN: 10000
Test Results:: NEGATIVE
Weight: 139 [lb_av]

## 2019-11-25 ENCOUNTER — Encounter: Payer: Self-pay | Admitting: Obstetrics and Gynecology

## 2019-11-28 ENCOUNTER — Encounter: Payer: Self-pay | Admitting: Obstetrics and Gynecology

## 2019-11-28 ENCOUNTER — Telehealth (INDEPENDENT_AMBULATORY_CARE_PROVIDER_SITE_OTHER): Payer: Medicaid Other | Admitting: Obstetrics and Gynecology

## 2019-11-28 DIAGNOSIS — O09212 Supervision of pregnancy with history of pre-term labor, second trimester: Secondary | ICD-10-CM

## 2019-11-28 DIAGNOSIS — O99512 Diseases of the respiratory system complicating pregnancy, second trimester: Secondary | ICD-10-CM

## 2019-11-28 DIAGNOSIS — Z3A21 21 weeks gestation of pregnancy: Secondary | ICD-10-CM

## 2019-11-28 DIAGNOSIS — O361910 Maternal care for other isoimmunization, first trimester, not applicable or unspecified: Secondary | ICD-10-CM

## 2019-11-28 DIAGNOSIS — O09211 Supervision of pregnancy with history of pre-term labor, first trimester: Secondary | ICD-10-CM

## 2019-11-28 DIAGNOSIS — D573 Sickle-cell trait: Secondary | ICD-10-CM

## 2019-11-28 DIAGNOSIS — O99012 Anemia complicating pregnancy, second trimester: Secondary | ICD-10-CM

## 2019-11-28 DIAGNOSIS — O09299 Supervision of pregnancy with other poor reproductive or obstetric history, unspecified trimester: Secondary | ICD-10-CM

## 2019-11-28 DIAGNOSIS — J45909 Unspecified asthma, uncomplicated: Secondary | ICD-10-CM

## 2019-11-28 DIAGNOSIS — O0990 Supervision of high risk pregnancy, unspecified, unspecified trimester: Secondary | ICD-10-CM

## 2019-11-28 DIAGNOSIS — O09292 Supervision of pregnancy with other poor reproductive or obstetric history, second trimester: Secondary | ICD-10-CM

## 2019-11-28 DIAGNOSIS — Z229 Carrier of infectious disease, unspecified: Secondary | ICD-10-CM | POA: Insufficient documentation

## 2019-11-28 NOTE — Progress Notes (Signed)
S/w pt for virtual visit. Pt reports fetal movement and some ligament pain. Pt is currently not at home with BP cuff.

## 2019-11-28 NOTE — Progress Notes (Signed)
   OBSTETRICS PRENATAL VIRTUAL VISIT ENCOUNTER NOTE  Provider location: Center for [facility] [place]   I connected with [name] on 11/28/19 at  9:15 AM EDT by MyChart Video Encounter at home and verified that I am speaking with the correct person using two identifiers.   I discussed the limitations, risks, security and privacy concerns of performing an evaluation and management service virtually and the availability of in person appointments. I also discussed with the patient that there may be a patient responsible charge related to this service. The patient expressed understanding and agreed to proceed. Subjective:  Jill Reed is a 29 y.o. G2P0101 at [redacted]w[redacted]d being seen today for ongoing prenatal care.  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  Patient reports no complaints.  Contractions: Not present. Vag. Bleeding: None.  Movement: Present. Denies any leaking of fluid.   The following portions of the patient's history were reviewed and updated as appropriate: allergies, current medications, past family history, past medical history, past social history, past surgical history and problem list.   Objective:  There were no vitals filed for this visit.  Fetal Status:     Movement: Present     General:  Alert, oriented and cooperative. Patient is in no acute distress.  Respiratory: Normal respiratory effort, no problems with respiration noted  Mental Status: Normal mood and affect. Normal behavior. Normal judgment and thought content.  Rest of  physical exam deferred due to type of encounter  Imaging:   Assessment and Plan:  Pregnancy: G2P0101 at [redacted]w[redacted]d  1. Supervision of high risk pregnancy, antepartum Has BP cuff at home, will take it when she gets home Patient has not had COVID vaccine. Reviewed risks/benefits in pregnancy/breastfeeding and gave info. She declines.  2. Sickle cell trait (HCC)  3. History of preterm labor, current pregnancy, first trimester Declined 17P  4. 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 History of pre-eclampsia in prior pregnancy, currently pregnant  6. Asthma affecting pregnancy in first trimester Prn inhaler  7. Carrier of disease SMA carrier S/p genetic testing   Preterm labor symptoms and general obstetric precautions including but not limited to vaginal bleeding, contractions, leaking of fluid and fetal movement were reviewed in detail with the patient. I discussed the assessment and treatment plan with the patient. The patient was provided an opportunity to ask questions and all were answered. The patient agreed with the plan and demonstrated an understanding of the instructions. The patient was advised to call back or seek an in-person office evaluation/go to [facility]U at [facility] for any urgent or concerning symptoms. Please refer to After Visit Summary for other counseling recommendations.   I provided 12 minutes of face-to-face time during this encounter.  Return in about 4 weeks (around 12/26/2019) for virtual, high OB.  No future appointments.  Conan Bowens, MD Center for [facility], [facility]

## 2019-12-25 ENCOUNTER — Encounter: Payer: Self-pay | Admitting: Obstetrics & Gynecology

## 2019-12-25 ENCOUNTER — Telehealth (INDEPENDENT_AMBULATORY_CARE_PROVIDER_SITE_OTHER): Payer: Medicaid Other | Admitting: Obstetrics & Gynecology

## 2019-12-25 DIAGNOSIS — O99512 Diseases of the respiratory system complicating pregnancy, second trimester: Secondary | ICD-10-CM | POA: Diagnosis not present

## 2019-12-25 DIAGNOSIS — O361920 Maternal care for other isoimmunization, second trimester, not applicable or unspecified: Secondary | ICD-10-CM | POA: Diagnosis not present

## 2019-12-25 DIAGNOSIS — Z3A25 25 weeks gestation of pregnancy: Secondary | ICD-10-CM

## 2019-12-25 DIAGNOSIS — O09292 Supervision of pregnancy with other poor reproductive or obstetric history, second trimester: Secondary | ICD-10-CM

## 2019-12-25 DIAGNOSIS — O0990 Supervision of high risk pregnancy, unspecified, unspecified trimester: Secondary | ICD-10-CM

## 2019-12-25 DIAGNOSIS — J45909 Unspecified asthma, uncomplicated: Secondary | ICD-10-CM

## 2019-12-25 DIAGNOSIS — O09212 Supervision of pregnancy with history of pre-term labor, second trimester: Secondary | ICD-10-CM

## 2019-12-25 NOTE — Progress Notes (Signed)
   TELEHEALTH OBSTETRICS VISIT ENCOUNTER NOTE  I connected with Jill Reed on 12/25/19 at 10:00 AM EDT by telephone at home and verified that I am speaking with the correct person using two identifiers.   I discussed the limitations, risks, security and privacy concerns of performing an evaluation and management service by telephone and the availability of in person appointments. I also discussed with the patient that there may be a patient responsible charge related to this service. The patient expressed understanding and agreed to proceed.  Subjective:  Jill Reed is a 29 y.o. G2P0101 at [redacted]w[redacted]d being followed for ongoing prenatal care.  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  Patient reports no complaints. Reports fetal movement. Denies any contractions, bleeding or leaking of fluid.   The following portions of the patient's history were reviewed and updated as appropriate: allergies, current medications, past family history, past medical history, past social history, past surgical history and problem list.   Objective:   General:  Alert, oriented and cooperative.   Mental Status: Normal mood and affect perceived. Normal judgment and thought content.  Rest of physical exam deferred due to type of encounter  Assessment and Plan:  Pregnancy: G2P0101 at [redacted]w[redacted]d 1. Supervision of high risk pregnancy, antepartum Pt doing well, no complaints. Glucola next visit.   Preterm labor symptoms and general obstetric precautions including but not limited to vaginal  bleeding, contractions, leaking of fluid and fetal movement were reviewed in detail with the patient.  I discussed the assessment and treatment plan with the patient. The patient was provided an opportunity to ask questions and all were answered. The patient agreed with the plan and demonstrated an understanding of the instructions. The patient was advised to call back or seek an in-person office evaluation/go to MAU at [facility] for any urgent or concerning symptoms. Please refer to After Visit Summary for other counseling recommendations.   I provided 8 minutes of non-face-to-face time during this encounter.  No follow-ups on file.  No future appointments.  Malachy Chamber, MD Center for [employer], [facility]

## 2019-12-25 NOTE — Progress Notes (Signed)
I connected with  Jill Reed on 12/25/19 by a video enabled telemedicine application and verified that I am speaking with the correct person using two identifiers.   I discussed the limitations of evaluation and management by telemedicine. The patient expressed understanding and agreed to proceed.   MyChart c/o loose stool x 2 days, pressure, pain.  Denies fever, chills, NV

## 2019-12-25 NOTE — Patient Instructions (Signed)
Glucose Tolerance Test During Pregnancy Why am I having this test? The glucose tolerance test (GTT) is done to check how your body processes sugar (glucose). This is one of several tests used to diagnose diabetes that develops during pregnancy (gestational diabetes mellitus). Gestational diabetes is a temporary form of diabetes that some women develop during pregnancy. It usually occurs during the second trimester of pregnancy and goes away after delivery. Testing (screening) for gestational diabetes usually occurs between 24 and 28 weeks of pregnancy. You may have the GTT test after having a 1-hour glucose screening test if the results from that test indicate that you may have gestational diabetes. You may also have this test if:  You have a history of gestational diabetes.  You have a history of giving birth to very large babies or have experienced repeated fetal loss (stillbirth).  You have signs and symptoms of diabetes, such as: ? Changes in your vision. ? Tingling or numbness in your hands or feet. ? Changes in hunger, thirst, and urination that are not otherwise explained by your pregnancy. What is being tested? This test measures the amount of glucose in your blood at different times during a period of 3 hours. This indicates how well your body is able to process glucose. What kind of sample is taken?  Blood samples are required for this test. They are usually collected by inserting a needle into a blood vessel. How do I prepare for this test?  For 3 days before your test, eat normally. Have plenty of carbohydrate-rich foods.  Follow instructions from your health care provider about: ? Eating or drinking restrictions on the day of the test. You may be asked to not eat or drink anything other than water (fast) starting 8-10 hours before the test. ? Changing or stopping your regular medicines. Some medicines may interfere with this test. Tell a health care provider about:  All  medicines you are taking, including vitamins, herbs, eye drops, creams, and over-the-counter medicines.  Any blood disorders you have.  Any surgeries you have had.  Any medical conditions you have. What happens during the test? First, your blood glucose will be measured. This is referred to as your fasting blood glucose, since you fasted before the test. Then, you will drink a glucose solution that contains a certain amount of glucose. Your blood glucose will be measured again 1, 2, and 3 hours after drinking the solution. This test takes about 3 hours to complete. You will need to stay at the testing location during this time. During the testing period:  Do not eat or drink anything other than the glucose solution.  Do not exercise.  Do not use any products that contain nicotine or tobacco, such as cigarettes and e-cigarettes. If you need help stopping, ask your health care provider. The testing procedure may vary among health care providers and hospitals. How are the results reported? Your results will be reported as milligrams of glucose per deciliter of blood (mg/dL) or millimoles per liter (mmol/L). Your health care provider will compare your results to normal ranges that were established after testing a large group of people (reference ranges). Reference ranges may vary among labs and hospitals. For this test, common reference ranges are:  Fasting: less than 95-105 mg/dL (5.3-5.8 mmol/L).  1 hour after drinking glucose: less than 180-190 mg/dL (10.0-10.5 mmol/L).  2 hours after drinking glucose: less than 155-165 mg/dL (8.6-9.2 mmol/L).  3 hours after drinking glucose: 140-145 mg/dL (7.8-8.1 mmol/L). What do the   results mean? Results within reference ranges are considered normal, meaning that your glucose levels are well-controlled. If two or more of your blood glucose levels are high, you may be diagnosed with gestational diabetes. If only one level is high, your health care  provider may suggest repeat testing or other tests to confirm a diagnosis. Talk with your health care provider about what your results mean. Questions to ask your health care provider Ask your health care provider, or the department that is doing the test:  When will my results be ready?  How will I get my results?  What are my treatment options?  What other tests do I need?  What are my next steps? Summary  The glucose tolerance test (GTT) is one of several tests used to diagnose diabetes that develops during pregnancy (gestational diabetes mellitus). Gestational diabetes is a temporary form of diabetes that some women develop during pregnancy.  You may have the GTT test after having a 1-hour glucose screening test if the results from that test indicate that you may have gestational diabetes. You may also have this test if you have any symptoms or risk factors for gestational diabetes.  Talk with your health care provider about what your results mean. This information is not intended to replace advice given to you by your health care provider. Make sure you discuss any questions you have with your health care provider. Document Revised: 10/24/2018 Document Reviewed: 02/12/2017 Elsevier Patient Education  2020 Elsevier Inc.  

## 2020-01-06 ENCOUNTER — Encounter (HOSPITAL_COMMUNITY): Payer: Self-pay | Admitting: Obstetrics and Gynecology

## 2020-01-06 ENCOUNTER — Inpatient Hospital Stay (HOSPITAL_COMMUNITY)
Admission: AD | Admit: 2020-01-06 | Discharge: 2020-01-06 | Disposition: A | Discharge status: Home / Self Care | Payer: Medicaid Other | Attending: Obstetrics and Gynecology | Admitting: Obstetrics and Gynecology

## 2020-01-06 ENCOUNTER — Inpatient Hospital Stay (HOSPITAL_COMMUNITY): Payer: Medicaid Other

## 2020-01-06 ENCOUNTER — Other Ambulatory Visit: Payer: Self-pay

## 2020-01-06 DIAGNOSIS — F17210 Nicotine dependence, cigarettes, uncomplicated: Secondary | ICD-10-CM | POA: Insufficient documentation

## 2020-01-06 DIAGNOSIS — O99891 Other specified diseases and conditions complicating pregnancy: Secondary | ICD-10-CM | POA: Diagnosis not present

## 2020-01-06 DIAGNOSIS — D573 Sickle-cell trait: Secondary | ICD-10-CM | POA: Insufficient documentation

## 2020-01-06 DIAGNOSIS — O26893 Other specified pregnancy related conditions, third trimester: Secondary | ICD-10-CM | POA: Diagnosis not present

## 2020-01-06 DIAGNOSIS — M549 Dorsalgia, unspecified: Secondary | ICD-10-CM | POA: Insufficient documentation

## 2020-01-06 DIAGNOSIS — Z3A26 26 weeks gestation of pregnancy: Secondary | ICD-10-CM | POA: Diagnosis not present

## 2020-01-06 DIAGNOSIS — O99333 Smoking (tobacco) complicating pregnancy, third trimester: Secondary | ICD-10-CM | POA: Insufficient documentation

## 2020-01-06 DIAGNOSIS — R109 Unspecified abdominal pain: Secondary | ICD-10-CM | POA: Diagnosis not present

## 2020-01-06 DIAGNOSIS — O99013 Anemia complicating pregnancy, third trimester: Secondary | ICD-10-CM | POA: Insufficient documentation

## 2020-01-06 DIAGNOSIS — Z8249 Family history of ischemic heart disease and other diseases of the circulatory system: Secondary | ICD-10-CM | POA: Diagnosis not present

## 2020-01-06 DIAGNOSIS — Z803 Family history of malignant neoplasm of breast: Secondary | ICD-10-CM | POA: Diagnosis not present

## 2020-01-06 DIAGNOSIS — Z3A36 36 weeks gestation of pregnancy: Secondary | ICD-10-CM | POA: Diagnosis not present

## 2020-01-06 DIAGNOSIS — O26892 Other specified pregnancy related conditions, second trimester: Secondary | ICD-10-CM | POA: Diagnosis not present

## 2020-01-06 DIAGNOSIS — O0990 Supervision of high risk pregnancy, unspecified, unspecified trimester: Secondary | ICD-10-CM

## 2020-01-06 DIAGNOSIS — Z3689 Encounter for other specified antenatal screening: Secondary | ICD-10-CM

## 2020-01-06 LAB — URINALYSIS, ROUTINE W REFLEX MICROSCOPIC
Bilirubin Urine: NEGATIVE
Glucose, UA: NEGATIVE mg/dL
Ketones, ur: NEGATIVE mg/dL
Leukocytes,Ua: NEGATIVE
Nitrite: NEGATIVE
Protein, ur: NEGATIVE mg/dL
Specific Gravity, Urine: 1.012 (ref 1.005–1.030)
pH: 7 (ref 5.0–8.0)

## 2020-01-06 MED ORDER — PHENAZOPYRIDINE HCL 200 MG PO TABS: 200.0000 mg | ORAL_TABLET | Freq: Every day | ORAL | 0 refills | Status: DC

## 2020-01-06 MED ORDER — CYCLOBENZAPRINE HCL 10 MG PO TABS: 10.0000 mg | ORAL_TABLET | Freq: Two times a day (BID) | ORAL | 0 refills | Status: DC | PRN

## 2020-01-06 MED ORDER — TAMSULOSIN HCL 0.4 MG PO CAPS: 0.4000 mg | ORAL_CAPSULE | Freq: Every day | ORAL | 0 refills | Status: DC

## 2020-01-06 MED ORDER — CYCLOBENZAPRINE HCL 5 MG PO TABS
10.0000 mg | ORAL_TABLET | Freq: Once | ORAL | Status: AC
  Administered 2020-01-06: 10 mg via ORAL
  Filled 2020-01-06: qty 2

## 2020-01-06 NOTE — MAU Note (Signed)
Presents with c/o abdominal pain and pressure.  Reports pain began on Sunday.  Reports took Tylenol on Sunday, no relief.  Denies VB or LOF.  Endorses +FM.

## 2020-01-06 NOTE — MAU Provider Note (Signed)
History     CSN: [id number]  Arrival date and time: 01/06/20 1456   First Provider Initiated Contact with Patient 01/06/20 1555       Chief Complaint  Patient presents with  . Abdominal Pain  . Pressure   Jill Reed is a 29 y.o. G2P1 at 64w5dwho presents to MAU with complaints of abdominal pain. She reports abdominal pain started occurring 2 days ago. Describes the pain as intermittent throbbing that is specific to right side. Rates pain 9/10, took tylenol on Sunday without relief. She has a hx of PTD but denies having contractions. Denies vaginal bleeding or discharge. Patient reports recent IC within the last 24 hours. Patient reports drinking 2-3 bottles of water per day. Patient denies dysuria, frequency, or hematuria.    OB History    Gravida  2   Para  1   Term  0   Preterm  1   AB  0   Living  1     SAB  0   TAB  0   Ectopic  0   Multiple  0   Live Births  1           Past Medical History:  Diagnosis Date  . Anxiety   . Asthma   . Atypical chest pain 09/23/2019  . Atypical chest pain 09/23/2019  . Depression   . Irregular heart beat   . Shortness of breath 09/23/2019  . Shortness of breath 09/23/2019  . Sickle cell trait ([place]   . Tobacco abuse 09/23/2019    Past Surgical History:  Procedure Laterality Date  . NO PAST SURGERIES      Family History  Problem Relation Age of Onset  . Sickle cell anemia Father   . Breast cancer Maternal Aunt        pt doesn't know age of diagnosis  . Hypertension Mother   . Arthritis Mother   . ADD / ADHD Paternal Uncle   . Arthritis Maternal Grandmother   . Hypertension Maternal Grandmother   . Diabetes Paternal Grandmother   . Stroke Paternal Grandmother     Social History   Tobacco Use  . Smoking status: Current Some Day Smoker    Types: Cigarettes    Last attempt to quit: 10/17/2011    Years since quitting: 8.2  . Smokeless tobacco: Never Used  . Tobacco comment: black n milds   Vaping Use  .  Vaping Use: Former  Substance Use Topics  . Alcohol use: No  . Drug use: No    Allergies:  Allergies  Allergen Reactions  . Carrot [Daucus Carota] Itching and Rash  . Mushroom Extract Complex Itching and Rash  . Peach [Prunus Persica] Itching and Rash    Medications Prior to Admission  Medication Sig Dispense Refill Last Dose  . Blood Pressure Monitoring (BLOOD PRESSURE KIT) DEVI 1 kit by Does not apply route once a week. Check Blood Pressure regularly and record readings into the [employer] App.  Large Cuff.  DX O90.0 1 each 0 Past Week at Unknown time  . Prenat-FeAsp-Meth-FA-DHA w/o A (PRENATE PIXIE) 10-0.6-0.4-200 MG CAPS Take 1 capsule by mouth daily. 30 capsule 11 01/06/2020 at Unknown time  . albuterol (PROVENTIL HFA;VENTOLIN HFA) 108 (90 Base) MCG/ACT inhaler Inhale 1-2 puffs into the lungs every 6 (six) hours as needed for wheezing or shortness of breath.   More than a month at Unknown time  . aspirin 81 MG chewable tablet Chew 1 tablet (81  mg total) by mouth daily. 30 tablet 8   . Elastic Bandages & Supports (COMFORT FIT MATERNITY SUPP MED) MISC 1 Device by Does not apply route daily. (Patient not taking: Reported on 10/31/2019) 1 each 0   . Prenat-Methylfol-Chol-Fish Oil (PRENATAL + COMPLETE MULTI) 0.267 & 373 MG THPK Take 1 tablet by mouth daily. 30 each 11   . promethazine (PHENERGAN) 25 MG tablet Take 0.5-1 tablets (12.5-25 mg total) by mouth every 6 (six) hours as needed for nausea or vomiting. (Patient not taking: Reported on 10/31/2019) 30 tablet 0   . traMADol (ULTRAM) 50 MG tablet Take 1 tablet (50 mg total) by mouth every 6 (six) hours as needed. (Patient not taking: Reported on 11/12/2019) 6 tablet 0     Review of Systems  Constitutional: Negative.   Respiratory: Negative.   Cardiovascular: Negative.   Gastrointestinal: Positive for abdominal pain. Negative for constipation, diarrhea, nausea and vomiting.  Genitourinary: Negative.   Neurological: Negative.    Psychiatric/Behavioral: Negative.    Physical Exam   Blood pressure 111/63, pulse 92, temperature 98.6 F (37 C), temperature source Oral, resp. rate 20, height '5\' 2"'  (1.575 m), weight 68.4 kg, last menstrual period 06/17/2019, SpO2 100 %.  Physical Exam  Constitutional: She is oriented to person, place, and time.  HENT:  Head: Normocephalic.  Cardiovascular: Normal rate and regular rhythm.  Respiratory: Effort normal and breath sounds normal. No respiratory distress. She has no wheezes.  GI: There is abdominal tenderness. There is no rebound and no guarding.  Gravid appropriate for gestational age, right flank tenderness  Neurological: She is alert and oriented to person, place, and time.  Skin: Skin is warm and dry.  Psychiatric: Her behavior is normal. Mood normal.   Dilation: Closed (1cm externally, closed internally) Effacement (%): Thick Cervical Position: Posterior Exam by:: V Illya Gienger CNM  Fetal monitoring: FHR: 135/moderate/+accels/ no decelerations  Toco: no UC   MAU Course  Procedures  MDM Orders Placed This Encounter  Procedures  . US RENAL  . Urinalysis, Routine w reflex microscopic   Labs and [place] reviewed:  Results for orders placed or performed during the hospital encounter of 01/06/20 (from the past 24 hour(s))  Urinalysis, Routine w reflex microscopic     Status: Abnormal   Collection Time: 01/06/20  3:34 PM  Result Value Ref Range   Color, Urine YELLOW YELLOW   APPearance CLOUDY (A) CLEAR   Specific Gravity, Urine 1.012 1.005 - 1.030   pH 7.0 5.0 - 8.0   Glucose, UA NEGATIVE NEGATIVE mg/dL   Hgb urine dipstick SMALL (A) NEGATIVE   Bilirubin Urine NEGATIVE NEGATIVE   Ketones, ur NEGATIVE NEGATIVE mg/dL   Protein, ur NEGATIVE NEGATIVE mg/dL   Nitrite NEGATIVE NEGATIVE   Leukocytes,Ua NEGATIVE NEGATIVE   RBC / HPF 21-50 0 - 5 RBC/hpf   WBC, UA 0-5 0 - 5 WBC/hpf   Bacteria, UA RARE (A) NONE SEEN   Squamous Epithelial / LPF 0-5 0 - 5   Hyaline  Casts, UA PRESENT    Amorphous Crystal PRESENT    US RENAL  Result Date: 01/06/2020 CLINICAL DATA:  Right flank pain, [redacted] weeks pregnant EXAM: RENAL / URINARY TRACT ULTRASOUND COMPLETE COMPARISON:  CT 03/30/2011 FINDINGS: Right Kidney: Renal measurements: 11.2 x 4 x 5.1 cm = volume: 118.9 mL . Echogenicity within normal limits. No mass or hydronephrosis visualized. Left Kidney: Renal measurements: 13 x 4.6 x 4.5 cm = volume: 141.9 mL. Cortical echogenicity is normal. No hydronephrosis. Echogenic  mass within the mid left kidney measuring 1.2 x 0.8 x 1.2 cm. Bladder: Appears normal for degree of bladder distention. Other: None. IMPRESSION: 1. Negative for hydronephrosis or shadowing stone. 2. 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 Electronically Signed   By: Donavan Foil M.D.   On: 01/06/2020 17:34   Flexeril 30m given PO after return from ultrasound. Reassessment after 30 minutes patient reports pain is down from 9/10 to 5/10.   Discussed results of ultrasound and labs with Dr AHarolyn Rutherford@ 1800. Recommends 3 days of pyridium and flomax for ureter spasms.   Patient also request flexeril for back pain. Rx sent to pharmacy of choice.  Discussed reasons to return to MAU. Follow up as scheduled in the office. Return to MAU as needed. Pt stable at time of discharge.   Assessment and Plan   1. Acute right flank pain   2. Supervision of high risk pregnancy, antepartum   3. [redacted] weeks gestation of pregnancy   4. NST (non-stress test) reactive    Discharge home Follow up as scheduled in the office for prenatal care Return to MAU as needed for reasons discussed and/or emergencies  Rx sent to pharmacy of choice    [place]   Specialty: Obstetrics and Gynecology Why: Follow up as scheduled for prenatal care and return to MAU as needed  Contact information: [id number] [phone]            Allergies as of 01/06/2020      Reactions   Carrot [daucus Carota] Itching, Rash   Mushroom Extract Complex Itching, Rash   Peach [prunus Persica] Itching, Rash      Medication List    STOP taking these medications   traMADol 50 MG tablet Commonly known as: ULTRAM     TAKE these medications   albuterol 108 (90 Base) MCG/ACT inhaler Commonly known as: VENTOLIN HFA Inhale 1-2 puffs into the lungs every 6 (six) hours as needed for wheezing or shortness of breath.   aspirin 81 MG chewable tablet Chew 1 tablet (81 mg total) by mouth daily.   Blood Pressure Kit Devi 1 kit by Does not apply route once a week. Check Blood Pressure regularly and record readings into the [employer] App.  Large Cuff.  DX O90.0   Comfort Fit Maternity Supp Med Misc 1 Device by Does not apply route daily.   cyclobenzaprine 10 MG tablet Commonly known as: FLEXERIL Take 1 tablet (10 mg total) by mouth 2 (two) times daily as needed for muscle spasms.   phenazopyridine 200 MG tablet Commonly known as: Pyridium Take 1 tablet (200 mg total) by mouth daily.   Prenatal + Complete Multi 0.267 & 373 MG Thpk Take 1 tablet by mouth daily.   Prenate Pixie 10-0.6-0.4-200 MG Caps Take 1 capsule by mouth daily.   promethazine 25 MG tablet Commonly known as: PHENERGAN Take 0.5-1 tablets (12.5-25 mg total) by mouth every 6 (six) hours as needed for nausea or vomiting.   tamsulosin 0.4 MG Caps capsule Commonly known as: FLOMAX Take 1 capsule (0.4 mg total) by mouth daily.       VLajean Manes CNM  01/06/2020, 7:52 PM

## 2020-01-08 LAB — CULTURE, OB URINE: Culture: NO GROWTH

## 2020-01-15 ENCOUNTER — Encounter: Payer: Self-pay | Admitting: Obstetrics and Gynecology

## 2020-01-15 ENCOUNTER — Other Ambulatory Visit: Payer: Medicaid Other

## 2020-01-15 ENCOUNTER — Other Ambulatory Visit: Payer: Self-pay

## 2020-01-15 ENCOUNTER — Ambulatory Visit (INDEPENDENT_AMBULATORY_CARE_PROVIDER_SITE_OTHER): Payer: Medicaid Other | Admitting: Obstetrics and Gynecology

## 2020-01-15 VITALS — BP 116/72 | HR 88 | Wt 153.0 lb

## 2020-01-15 DIAGNOSIS — O09299 Supervision of pregnancy with other poor reproductive or obstetric history, unspecified trimester: Secondary | ICD-10-CM

## 2020-01-15 DIAGNOSIS — O09211 Supervision of pregnancy with history of pre-term labor, first trimester: Secondary | ICD-10-CM

## 2020-01-15 DIAGNOSIS — Z3A28 28 weeks gestation of pregnancy: Secondary | ICD-10-CM

## 2020-01-15 DIAGNOSIS — O0993 Supervision of high risk pregnancy, unspecified, third trimester: Secondary | ICD-10-CM

## 2020-01-15 DIAGNOSIS — Z8759 Personal history of other complications of pregnancy, childbirth and the puerperium: Secondary | ICD-10-CM

## 2020-01-15 DIAGNOSIS — O0990 Supervision of high risk pregnancy, unspecified, unspecified trimester: Secondary | ICD-10-CM

## 2020-01-15 DIAGNOSIS — Z8751 Personal history of pre-term labor: Secondary | ICD-10-CM

## 2020-01-15 NOTE — Progress Notes (Signed)
   PRENATAL VISIT NOTE  Subjective:  Jill Reed is a 29 y.o. G2P0101 at [redacted]w[redacted]d being seen today for ongoing prenatal care.  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  Patient reports no complaints.  Contractions: Not present. Vag. Bleeding: None.  Movement: Present. Denies leaking of fluid.   The following portions of the patient's history were reviewed and updated as appropriate: allergies, current medications, past family history, past medical history, past social history, past surgical history and problem list.   Objective:   Vitals:   01/15/20 0844  BP: 116/72  Pulse: 88  Weight: 153 lb (69.4 kg)    Fetal Status: Fetal Heart Rate (bpm): 138 Fundal Height: 28 cm Movement: Present     General:  Alert, oriented and cooperative. Patient is in no acute distress.  Skin: Skin is warm and dry. No rash noted.   Cardiovascular: Normal heart rate noted  Respiratory: Normal respiratory effort, no problems with respiration noted  Abdomen: Soft, gravid, appropriate for gestational age.  Pain/Pressure: Absent     Pelvic: Cervical exam deferred        Extremities: Normal range of motion.  Edema: None  Mental Status: Normal mood and affect. Normal behavior. Normal judgment and thought content.   Assessment and Plan:  Pregnancy: G2P0101 at [redacted]w[redacted]d 1. Supervision of high risk pregnancy, antepartum Patient is doing well without complaints Third trimester labs and glucola today Patient plans depo-provera for contraception  2. History of preterm labor, current  pregnancy, first trimester Patient declined 17-P  3. History of pre-eclampsia in prior pregnancy, currently pregnant Continue ASA  Preterm labor symptoms and general obstetric precautions including but not limited to vaginal bleeding, contractions, leaking of fluid and fetal movement were reviewed in detail with the patient. Please refer to After Visit Summary for other counseling recommendations.   Return in about 2 weeks (around 01/29/2020) for in person, ROB, High risk.  Future Appointments  Date Time Provider Department Center  01/15/2020  9:45 AM Sharlyn Odonnel, Gigi Gin, MD [facility] None    Catalina Antigua, MD

## 2020-01-15 NOTE — Progress Notes (Signed)
ROB/GTT.  Declined TDAP.  Patient wants to know about getting 3D ultrasound pictures.

## 2020-01-16 LAB — CBC
Hematocrit: 32.8 % — ABNORMAL LOW (ref 34.0–46.6)
Hemoglobin: 11.2 g/dL (ref 11.1–15.9)
MCH: 33.5 pg — ABNORMAL HIGH (ref 26.6–33.0)
MCHC: 34.1 g/dL (ref 31.5–35.7)
MCV: 98 fL — ABNORMAL HIGH (ref 79–97)
Platelets: 157 10*3/uL (ref 150–450)
RBC: 3.34 x10E6/uL — ABNORMAL LOW (ref 3.77–5.28)
RDW: 12.3 % (ref 11.7–15.4)
WBC: 13.2 10*3/uL — ABNORMAL HIGH (ref 3.4–10.8)

## 2020-01-16 LAB — RPR: RPR Ser Ql: NONREACTIVE

## 2020-01-16 LAB — GLUCOSE TOLERANCE, 2 HOURS W/ 1HR
Glucose, 1 hour: 105 mg/dL (ref 65–179)
Glucose, 2 hour: 89 mg/dL (ref 65–152)
Glucose, Fasting: 76 mg/dL (ref 65–91)

## 2020-01-16 LAB — HIV ANTIBODY (ROUTINE TESTING W REFLEX): HIV Screen 4th Generation wRfx: NONREACTIVE

## 2020-01-23 ENCOUNTER — Inpatient Hospital Stay (HOSPITAL_COMMUNITY)
Admission: AD | Admit: 2020-01-23 | Discharge: 2020-01-23 | Disposition: A | Discharge status: Home / Self Care | Payer: Medicaid Other | Attending: Obstetrics and Gynecology | Admitting: Obstetrics and Gynecology

## 2020-01-23 ENCOUNTER — Other Ambulatory Visit: Payer: Self-pay

## 2020-01-23 ENCOUNTER — Encounter (HOSPITAL_COMMUNITY): Payer: Self-pay | Admitting: Obstetrics and Gynecology

## 2020-01-23 DIAGNOSIS — O99333 Smoking (tobacco) complicating pregnancy, third trimester: Secondary | ICD-10-CM | POA: Diagnosis not present

## 2020-01-23 DIAGNOSIS — F1290 Cannabis use, unspecified, uncomplicated: Secondary | ICD-10-CM

## 2020-01-23 DIAGNOSIS — Z3A29 29 weeks gestation of pregnancy: Secondary | ICD-10-CM

## 2020-01-23 DIAGNOSIS — O26893 Other specified pregnancy related conditions, third trimester: Secondary | ICD-10-CM | POA: Diagnosis not present

## 2020-01-23 DIAGNOSIS — F17210 Nicotine dependence, cigarettes, uncomplicated: Secondary | ICD-10-CM | POA: Insufficient documentation

## 2020-01-23 DIAGNOSIS — O99513 Diseases of the respiratory system complicating pregnancy, third trimester: Secondary | ICD-10-CM | POA: Diagnosis not present

## 2020-01-23 DIAGNOSIS — D573 Sickle-cell trait: Secondary | ICD-10-CM | POA: Diagnosis not present

## 2020-01-23 DIAGNOSIS — O219 Vomiting of pregnancy, unspecified: Secondary | ICD-10-CM | POA: Diagnosis not present

## 2020-01-23 DIAGNOSIS — K219 Gastro-esophageal reflux disease without esophagitis: Secondary | ICD-10-CM | POA: Diagnosis not present

## 2020-01-23 DIAGNOSIS — O99613 Diseases of the digestive system complicating pregnancy, third trimester: Secondary | ICD-10-CM | POA: Diagnosis not present

## 2020-01-23 DIAGNOSIS — R109 Unspecified abdominal pain: Secondary | ICD-10-CM | POA: Insufficient documentation

## 2020-01-23 DIAGNOSIS — O212 Late vomiting of pregnancy: Secondary | ICD-10-CM | POA: Insufficient documentation

## 2020-01-23 DIAGNOSIS — J45909 Unspecified asthma, uncomplicated: Secondary | ICD-10-CM | POA: Insufficient documentation

## 2020-01-23 DIAGNOSIS — O99891 Other specified diseases and conditions complicating pregnancy: Secondary | ICD-10-CM

## 2020-01-23 DIAGNOSIS — O0990 Supervision of high risk pregnancy, unspecified, unspecified trimester: Secondary | ICD-10-CM

## 2020-01-23 DIAGNOSIS — O99013 Anemia complicating pregnancy, third trimester: Secondary | ICD-10-CM | POA: Diagnosis not present

## 2020-01-23 DIAGNOSIS — R825 Elevated urine levels of drugs, medicaments and biological substances: Secondary | ICD-10-CM

## 2020-01-23 LAB — URINALYSIS, ROUTINE W REFLEX MICROSCOPIC
Bilirubin Urine: NEGATIVE
Glucose, UA: NEGATIVE mg/dL
Ketones, ur: NEGATIVE mg/dL
Leukocytes,Ua: NEGATIVE
Nitrite: NEGATIVE
Protein, ur: NEGATIVE mg/dL
Specific Gravity, Urine: 1.005 (ref 1.005–1.030)
pH: 7 (ref 5.0–8.0)

## 2020-01-23 LAB — RAPID URINE DRUG SCREEN, HOSP PERFORMED
Amphetamines: NOT DETECTED
Barbiturates: NOT DETECTED
Benzodiazepines: NOT DETECTED
Cocaine: NOT DETECTED
Opiates: NOT DETECTED
Tetrahydrocannabinol: POSITIVE — AB

## 2020-01-23 MED ORDER — LACTATED RINGERS IV BOLUS
1000.0000 mL | Freq: Once | INTRAVENOUS | Status: AC
  Administered 2020-01-23: 1000 mL via INTRAVENOUS

## 2020-01-23 MED ORDER — FAMOTIDINE IN NACL 20-0.9 MG/50ML-% IV SOLN
20.0000 mg | Freq: Once | INTRAVENOUS | Status: AC
  Administered 2020-01-23: 20 mg via INTRAVENOUS
  Filled 2020-01-23: qty 50

## 2020-01-23 MED ORDER — TERCONAZOLE 0.4 % VA CREA: 1.0000 | TOPICAL_CREAM | Freq: Every day | VAGINAL | 0 refills | Status: DC

## 2020-01-23 MED ORDER — OMEPRAZOLE 20 MG PO CPDR: 20.0000 mg | DELAYED_RELEASE_CAPSULE | Freq: Every day | ORAL | 0 refills | Status: DC

## 2020-01-23 MED ORDER — PROMETHAZINE HCL 25 MG/ML IJ SOLN
12.5000 mg | Freq: Once | INTRAMUSCULAR | Status: AC
  Administered 2020-01-23: 12.5 mg via INTRAVENOUS
  Filled 2020-01-23: qty 1

## 2020-01-23 MED ORDER — PROMETHAZINE HCL 12.5 MG PO TABS: 12.5000 mg | ORAL_TABLET | Freq: Four times a day (QID) | ORAL | 0 refills | Status: DC | PRN

## 2020-01-23 NOTE — MAU Provider Note (Signed)
History     CSN: [id number]  Arrival date and time: 01/23/20 1413   First Provider Initiated Contact with Patient 01/23/20 1512      Chief Complaint  Patient presents with  . Nausea  . Emesis  . Abdominal Pain   HPI Jill Reed is a 29 y.o. G2P0101 at [redacted]w[redacted]d who presents to MAU with chief complaint of epigastric pain. This is a new problem, onset last night 01/23/2020. Patient states she had a family dinner of steak and salad, then experienced new onset epigastric pain shortly afterwards. She was able to sleep through the night but woke up with the same epigastric pain this morning. She then experienced new-onset nausea and vomiting around 3pm this afternoon. She has been able to tolerate anything PO since that time. She denies vaginal bleeding, leaking of fluid, decreased fetal movement, fever, falls, or recent illness.   She receives care with [facility] and her next appointment is 01/29/2020.  OB History    Gravida  2   Para  1   Term  0   Preterm  1   AB  0   Living  1     SAB  0   TAB  0   Ectopic  0   Multiple  0   Live Births  1           Past Medical History:  Diagnosis Date  . Anxiety   . Asthma   . Atypical chest pain 09/23/2019  . Atypical chest pain 09/23/2019  . Depression   . Irregular heart beat   . Shortness of breath 09/23/2019  . Shortness of breath 09/23/2019  . Sickle cell trait (HCC)   . Tobacco abuse 09/23/2019    Past Surgical History:  Procedure Laterality Date  . NO PAST SURGERIES      Family History  Problem Relation Age of Onset  . Sickle cell anemia Father   . Breast cancer Maternal Aunt        pt doesn't know age of diagnosis  . Hypertension Mother   . Arthritis Mother   . ADD / ADHD Paternal Uncle   . Arthritis Maternal Grandmother   . Hypertension Maternal Grandmother   . Diabetes Paternal Grandmother   . Stroke Paternal Grandmother     Social History   Tobacco Use  . Smoking status: Current Some Day Smoker     Types: Cigarettes    Last attempt to quit: 10/17/2011    Years since quitting: 8.2  . Smokeless tobacco: Never Used  . Tobacco comment: black n milds   Vaping Use  . Vaping Use: Former  Substance Use Topics  . Alcohol use: No  . Drug use: No    Allergies:  Allergies  Allergen Reactions  . Carrot [Daucus Carota] Itching and Rash  . Mushroom Extract Complex Itching and Rash  . Peach [Prunus Persica] Itching and Rash    No medications prior to admission.    Review of Systems  Gastrointestinal: Positive for abdominal pain, nausea and vomiting.  Genitourinary: Negative for dysuria and vaginal bleeding.  Musculoskeletal: Negative for back pain.  All other systems reviewed and are negative.  Physical Exam   Blood pressure (!) 98/50, pulse 77, temperature 99 F (37.2 C), temperature source Oral, resp. rate 16, height 5\' 2"  (1.575 m), weight 70.3 kg, last menstrual period 06/17/2019, SpO2 100 %.  Physical Exam Vitals and nursing note reviewed. Exam conducted with a chaperone present.  Abdominal:  Palpations: Abdomen is soft.     Tenderness: There is no abdominal tenderness. There is no right CVA tenderness or left CVA tenderness.     Comments: Gravid  Genitourinary:    Vagina: Vaginal discharge present.     Cervix: Normal.  Skin:    General: Skin is warm and dry.     MAU Course  Procedures  Orders Placed This Encounter  Procedures  . Urinalysis, Routine w reflex microscopic  . Rapid urine drug screen (hospital performed)  . Insert peripheral IV   Meds ordered this encounter  Medications  . lactated ringers bolus 1,000 mL  . famotidine (PEPCID) IVPB 20 mg premix  . promethazine (PHENERGAN) injection 12.5 mg  . omeprazole (PRILOSEC) 20 MG capsule    Sig: Take 1 capsule (20 mg total) by mouth daily.    Dispense:  30 capsule    Refill:  0    Order Specific Question:   Supervising Provider    Answer:   Mariel Aloe A [[id number]]  . promethazine (PHENERGAN)  12.5 MG tablet    Sig: Take 1 tablet (12.5 mg total) by mouth every 6 (six) hours as needed for nausea or vomiting.    Dispense:  30 tablet    Refill:  0    Order Specific Question:   Supervising Provider    Answer:   Mariel Aloe A [[id number]]  . terconazole (TERAZOL 7) 0.4 % vaginal cream    Sig: Place 1 applicator vaginally at bedtime. Use for seven days    Dispense:  45 g    Refill:  0    Order Specific Question:   Supervising Provider    Answer:   Warden Fillers [[id number]]   Results for orders placed or performed during the hospital encounter of 01/23/20 (from the past 24 hour(s))  Urinalysis, Routine w reflex microscopic     Status: Abnormal   Collection Time: 01/23/20  3:25 PM  Result Value Ref Range   Color, Urine YELLOW YELLOW   APPearance CLEAR CLEAR   Specific Gravity, Urine 1.005 1.005 - 1.030   pH 7.0 5.0 - 8.0   Glucose, UA NEGATIVE NEGATIVE mg/dL   Hgb urine dipstick MODERATE (A) NEGATIVE   Bilirubin Urine NEGATIVE NEGATIVE   Ketones, ur NEGATIVE NEGATIVE mg/dL   Protein, ur NEGATIVE NEGATIVE mg/dL   Nitrite NEGATIVE NEGATIVE   Leukocytes,Ua NEGATIVE NEGATIVE   RBC / HPF 6-10 0 - 5 RBC/hpf   WBC, UA 0-5 0 - 5 WBC/hpf   Bacteria, UA RARE (A) NONE SEEN   Squamous Epithelial / LPF 0-5 0 - 5   Mucus PRESENT   Rapid urine drug screen (hospital performed)     Status: Abnormal   Collection Time: 01/23/20  3:25 PM  Result Value Ref Range   Opiates NONE DETECTED NONE DETECTED   Cocaine NONE DETECTED NONE DETECTED   Benzodiazepines NONE DETECTED NONE DETECTED   Amphetamines NONE DETECTED NONE DETECTED   Tetrahydrocannabinol POSITIVE (A) NONE DETECTED   Barbiturates NONE DETECTED NONE DETECTED    --Patient endorses resolution of pain following medications given in MAU --Reactive tracing: baseline 135, moderate variability, positive 10 x 10 accels, no decels --Toco: rare contractions, UI --Closed cervix --+ THC, denies use, concern for THC as trigger for nausea and  vomiting --Treat presumptively for yeast infection based on physical exam  Assessment and Plan  --28 y.o. G2P0101 at [redacted]w[redacted]d  --Reactive tracing, closed cervix --Acid reflux --+ THC --Discharge home in stable condition  F/U: --  [facility] 01/29/2020  Raju Coppolino C Shaquille Janes,CNM 01/23/2020, 7:29 PM

## 2020-01-23 NOTE — Discharge Instructions (Signed)

## 2020-01-23 NOTE — MAU Note (Signed)
since last night, has been having pain in upper abd. Pain has been constant, though was able to sleep. Top of abd is getting really tight.  Tried to eat, but got sick every time she tried. Doesn't think it is contractions.  Denies fever, diarrhea.

## 2020-01-29 ENCOUNTER — Other Ambulatory Visit: Payer: Self-pay

## 2020-01-29 ENCOUNTER — Encounter: Payer: Medicaid Other | Admitting: Obstetrics and Gynecology

## 2020-01-29 ENCOUNTER — Ambulatory Visit (INDEPENDENT_AMBULATORY_CARE_PROVIDER_SITE_OTHER): Payer: Medicaid Other

## 2020-01-29 VITALS — BP 125/55 | HR 94 | Wt 155.3 lb

## 2020-01-29 DIAGNOSIS — O0990 Supervision of high risk pregnancy, unspecified, unspecified trimester: Secondary | ICD-10-CM

## 2020-01-29 DIAGNOSIS — O09213 Supervision of pregnancy with history of pre-term labor, third trimester: Secondary | ICD-10-CM

## 2020-01-29 DIAGNOSIS — O09293 Supervision of pregnancy with other poor reproductive or obstetric history, third trimester: Secondary | ICD-10-CM

## 2020-01-29 DIAGNOSIS — Z3A30 30 weeks gestation of pregnancy: Secondary | ICD-10-CM

## 2020-01-29 DIAGNOSIS — O09299 Supervision of pregnancy with other poor reproductive or obstetric history, unspecified trimester: Secondary | ICD-10-CM

## 2020-01-29 DIAGNOSIS — O09211 Supervision of pregnancy with history of pre-term labor, first trimester: Secondary | ICD-10-CM

## 2020-01-29 NOTE — Progress Notes (Signed)
   HIGH-RISK PREGNANCY OFFICE VISIT  Patient name: Jill Reed MRN [id number]  Date of birth: 11/15/1990 Chief Complaint:   Routine Prenatal Visit  Subjective:   Jill Reed is a 29 y.o. 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  Patient presents today with complaint of no complaints. However, patient expresses concern about increased fetal movement and questions normalcy.  Patient denies vaginal concerns including abnormal discharge, leaking of fluid, and bleeding.  Contractions: Irritability. Vag. Bleeding: None.  Movement: Present.  Reviewed past medical,surgical, social, obstetrical and family history as well as problem list, medications and allergies.  Objective   Vitals:   01/29/20 1032  BP: (!) 125/55  Pulse: 94  Weight: 155 lb 4.8 oz (70.4 kg)  Body mass index is 28.4 kg/m.  Total Weight Gain:15 lb 4.8 oz (6.94 kg)         Physical Examination:   General appearance: Well appearing, and in no distress  Mental status: Alert, oriented to person, place, and time  Skin: Warm & dry  Cardiovascular: Normal heart rate noted  Respiratory: Normal respiratory effort, no distress  Abdomen: Soft, gravid, nontender, AGA with Fundal height of Fundal Height: 31 cm  Pelvic: Cervical exam deferred           Extremities: Edema: None  Fetal Status: Fetal Heart Rate (bpm): 135  Movement: Present   No results found for this or any previous visit (from the past 24 hour(s)).  Assessment &  Plan:  Low-risk pregnancy of a 29 y.o., G2P0101 at [redacted]w[redacted]d with an Estimated Date of Delivery: 04/08/20   1. Supervision of high risk pregnancy, antepartum -Reassured that fetal movement is positive sign of fetal well-being.  -Endorses desire for depo provera for BCM.  Infant for circumcision. Breastfeeding -Anticipatory guidance for upcoming appts.   2. History of preterm labor, current pregnancy, first trimester -Declines 17p  3. History of pre-eclampsia in prior pregnancy, currently pregnant -Baby Aspirin daily   Meds: No orders of the defined types were placed in this encounter.  Labs/procedures today:  Lab Orders  No laboratory test(s) ordered today     Reviewed: Preterm labor symptoms and general obstetric precautions including but not limited to vaginal bleeding, contractions, leaking of fluid and fetal movement were reviewed in detail with the patient.  All questions were answered.  Follow-up: Return in about 3 weeks (around 02/19/2020) for HROB.  No orders of the defined types were placed in this encounter.  Cherre Robins MSN, CNM 01/29/2020

## 2020-01-29 NOTE — Progress Notes (Signed)
Patient presents for ROB. Patient has no concerns today. 

## 2020-02-19 ENCOUNTER — Ambulatory Visit (INDEPENDENT_AMBULATORY_CARE_PROVIDER_SITE_OTHER): Payer: Medicaid Other | Admitting: Obstetrics & Gynecology

## 2020-02-19 ENCOUNTER — Other Ambulatory Visit: Payer: Self-pay

## 2020-02-19 VITALS — BP 109/66 | HR 91 | Wt 162.0 lb

## 2020-02-19 DIAGNOSIS — O0990 Supervision of high risk pregnancy, unspecified, unspecified trimester: Secondary | ICD-10-CM

## 2020-02-19 DIAGNOSIS — O09211 Supervision of pregnancy with history of pre-term labor, first trimester: Secondary | ICD-10-CM

## 2020-02-19 DIAGNOSIS — D573 Sickle-cell trait: Secondary | ICD-10-CM

## 2020-02-19 DIAGNOSIS — O09299 Supervision of pregnancy with other poor reproductive or obstetric history, unspecified trimester: Secondary | ICD-10-CM

## 2020-02-19 NOTE — Progress Notes (Signed)
Pt states she is having some occ ctx.

## 2020-02-19 NOTE — Patient Instructions (Signed)

## 2020-02-19 NOTE — Addendum Note (Signed)
Addended by: Adam Phenix on: 02/19/2020 11:37 AM   Modules accepted: Orders

## 2020-02-19 NOTE — Progress Notes (Signed)
   PRENATAL VISIT NOTE  Subjective:  Jill Reed is a 29 y.o. G2P0101 at [redacted]w[redacted]d being seen today for ongoing prenatal care.  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  Patient reports occasional contractions.  Contractions: Irregular. Vag. Bleeding: None.  Movement: Present. Denies leaking of fluid.   The following portions of the patient's history were reviewed and updated as appropriate: allergies, current medications, past family history, past medical history, past social history, past surgical history and problem list.   Objective:   Vitals:   02/19/20 0959  BP: 109/66  Pulse: 91  Weight: 162 lb (73.5 kg)    Fetal Status: Fetal Heart Rate (bpm): 145 Fundal Height: 33 cm Movement: Present     General:  Alert, oriented and cooperative. Patient is in no acute distress.  Skin: Skin is warm and dry. No rash noted.   Cardiovascular: Normal heart rate noted  Respiratory: Normal respiratory effort, no problems with respiration noted  Abdomen: Soft, gravid, appropriate for gestational age.  Pain/Pressure: Present     Pelvic: Cervical exam deferred        Extremities: Normal range of motion.     Mental Status: Normal mood and affect. Normal behavior. Normal judgment and thought content.   Assessment and Plan:  Pregnancy: G2P0101 at [redacted]w[redacted]d 1. Sickle cell trait (HCC) No sx  2. Supervision of high risk pregnancy, antepartum Dating by 1st trimester [place]  3. History of pre-eclampsia in prior pregnancy, currently pregnant Nl BP  4. History of  preterm labor, current pregnancy, first trimester   Preterm labor symptoms and general obstetric precautions including but not limited to vaginal bleeding, contractions, leaking of fluid and fetal movement were reviewed in detail with the patient. Please refer to After Visit Summary for other counseling recommendations.   Return in about 2 weeks (around 03/04/2020).  No future appointments.  Scheryl Darter, MD

## 2020-02-28 ENCOUNTER — Other Ambulatory Visit: Payer: Self-pay | Admitting: Certified Nurse Midwife

## 2020-03-04 ENCOUNTER — Other Ambulatory Visit: Payer: Self-pay

## 2020-03-04 ENCOUNTER — Ambulatory Visit (INDEPENDENT_AMBULATORY_CARE_PROVIDER_SITE_OTHER): Payer: Medicaid Other | Admitting: Obstetrics

## 2020-03-04 ENCOUNTER — Encounter: Payer: Self-pay | Admitting: Obstetrics

## 2020-03-04 VITALS — BP 114/72 | HR 96 | Wt 165.0 lb

## 2020-03-04 DIAGNOSIS — O99353 Diseases of the nervous system complicating pregnancy, third trimester: Secondary | ICD-10-CM

## 2020-03-04 DIAGNOSIS — Z3A35 35 weeks gestation of pregnancy: Secondary | ICD-10-CM

## 2020-03-04 DIAGNOSIS — F17200 Nicotine dependence, unspecified, uncomplicated: Secondary | ICD-10-CM

## 2020-03-04 DIAGNOSIS — O09293 Supervision of pregnancy with other poor reproductive or obstetric history, third trimester: Secondary | ICD-10-CM

## 2020-03-04 DIAGNOSIS — O26899 Other specified pregnancy related conditions, unspecified trimester: Secondary | ICD-10-CM

## 2020-03-04 DIAGNOSIS — O0990 Supervision of high risk pregnancy, unspecified, unspecified trimester: Secondary | ICD-10-CM

## 2020-03-04 DIAGNOSIS — G5600 Carpal tunnel syndrome, unspecified upper limb: Secondary | ICD-10-CM

## 2020-03-04 DIAGNOSIS — O99333 Smoking (tobacco) complicating pregnancy, third trimester: Secondary | ICD-10-CM

## 2020-03-04 DIAGNOSIS — O09299 Supervision of pregnancy with other poor reproductive or obstetric history, unspecified trimester: Secondary | ICD-10-CM

## 2020-03-04 NOTE — Progress Notes (Signed)
ROB c/o corporal tunnel constantly hurting 25/10 x 2-3 weeks, hands swollen, pressure, baby moving too much, and contractions at night.

## 2020-03-04 NOTE — Progress Notes (Signed)
Subjective:  Jill Reed is a 29 y.o. G2P0101 at [redacted]w[redacted]d being seen today for ongoing prenatal care.  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  Patient reports carpal tunnel symptoms.  Contractions: Irregular. Vag. Bleeding: None.  Movement: Present. Denies leaking of fluid.   The following portions of the patient's history were reviewed and updated as appropriate: allergies, current medications, past family history, past medical history, past social history, past surgical history and problem list. Problem list updated.  Objective:   Vitals:   03/04/20 1119  BP: 114/72  Pulse: 96  Weight: 165 lb (74.8 kg)    Fetal Status:     Movement: Present     General:  Alert, oriented and cooperative. Patient is in no acute distress.  Skin: Skin is warm and dry. No rash noted.   Cardiovascular: Normal heart rate noted  Respiratory: Normal respiratory effort, no problems with respiration noted  Abdomen: Soft, gravid, appropriate for gestational age. Pain/Pressure: Present     Pelvic:  Cervical exam deferred        Extremities: Normal range of motion.  Edema: Trace  Mental Status: Normal mood and affect. Normal behavior. Normal judgment and thought content.   Urinalysis:      Assessment and Plan:  Pregnancy: G2P0101 at 102w0d  1. Supervision of high risk pregnancy, antepartum  2. History of pre-eclampsia in prior pregnancy, currently pregnant  3. Tobacco dependence - cessation encouraged  4. Carpal tunnel syndrome during pregnancy Rx: - Wrist  Splints # 2    There are no diagnoses linked to this encounter. Preterm labor symptoms and general obstetric precautions including but not limited to vaginal bleeding, contractions, leaking of fluid and fetal movement were reviewed in detail with the patient. Please refer to After Visit Summary for other counseling recommendations.   Return in about 1 week (around 03/11/2020) for ROB.  GBS.   Brock Bad, MD  03/04/20

## 2020-03-10 ENCOUNTER — Inpatient Hospital Stay (HOSPITAL_COMMUNITY)
Admission: AD | Admit: 2020-03-10 | Discharge: 2020-03-10 | Disposition: A | Discharge status: Home / Self Care | Payer: Medicaid Other | Attending: Obstetrics and Gynecology | Admitting: Obstetrics and Gynecology

## 2020-03-10 ENCOUNTER — Other Ambulatory Visit: Payer: Self-pay

## 2020-03-10 ENCOUNTER — Encounter (HOSPITAL_COMMUNITY): Payer: Self-pay | Admitting: Obstetrics and Gynecology

## 2020-03-10 DIAGNOSIS — F17210 Nicotine dependence, cigarettes, uncomplicated: Secondary | ICD-10-CM | POA: Insufficient documentation

## 2020-03-10 DIAGNOSIS — Z79899 Other long term (current) drug therapy: Secondary | ICD-10-CM | POA: Diagnosis not present

## 2020-03-10 DIAGNOSIS — O99333 Smoking (tobacco) complicating pregnancy, third trimester: Secondary | ICD-10-CM | POA: Insufficient documentation

## 2020-03-10 DIAGNOSIS — D573 Sickle-cell trait: Secondary | ICD-10-CM | POA: Diagnosis not present

## 2020-03-10 DIAGNOSIS — J45909 Unspecified asthma, uncomplicated: Secondary | ICD-10-CM | POA: Insufficient documentation

## 2020-03-10 DIAGNOSIS — O4703 False labor before 37 completed weeks of gestation, third trimester: Secondary | ICD-10-CM | POA: Diagnosis not present

## 2020-03-10 DIAGNOSIS — O99013 Anemia complicating pregnancy, third trimester: Secondary | ICD-10-CM | POA: Insufficient documentation

## 2020-03-10 DIAGNOSIS — O99513 Diseases of the respiratory system complicating pregnancy, third trimester: Secondary | ICD-10-CM | POA: Insufficient documentation

## 2020-03-10 DIAGNOSIS — Z3A35 35 weeks gestation of pregnancy: Secondary | ICD-10-CM | POA: Diagnosis not present

## 2020-03-10 DIAGNOSIS — O0990 Supervision of high risk pregnancy, unspecified, unspecified trimester: Secondary | ICD-10-CM

## 2020-03-10 DIAGNOSIS — Z7982 Long term (current) use of aspirin: Secondary | ICD-10-CM | POA: Diagnosis not present

## 2020-03-10 DIAGNOSIS — O09213 Supervision of pregnancy with history of pre-term labor, third trimester: Secondary | ICD-10-CM | POA: Insufficient documentation

## 2020-03-10 LAB — URINALYSIS, ROUTINE W REFLEX MICROSCOPIC
Bilirubin Urine: NEGATIVE
Glucose, UA: NEGATIVE mg/dL
Hgb urine dipstick: NEGATIVE
Ketones, ur: 5 mg/dL — AB
Leukocytes,Ua: NEGATIVE
Nitrite: NEGATIVE
Protein, ur: NEGATIVE mg/dL
Specific Gravity, Urine: 1.006 (ref 1.005–1.030)
pH: 6 (ref 5.0–8.0)

## 2020-03-10 MED ORDER — BETAMETHASONE SOD PHOS & ACET 6 (3-3) MG/ML IJ SUSP: 12.0000 mg | INTRAMUSCULAR | Status: DC

## 2020-03-10 MED ORDER — NIFEDIPINE 10 MG PO CAPS
20.0000 mg | ORAL_CAPSULE | Freq: Once | ORAL | Status: AC
  Administered 2020-03-10: 20 mg via ORAL
  Filled 2020-03-10: qty 2

## 2020-03-10 MED ORDER — NALBUPHINE HCL 10 MG/ML IJ SOLN: 10.0000 mg | INTRAMUSCULAR | Status: DC | PRN

## 2020-03-10 MED ORDER — NALBUPHINE HCL 10 MG/ML IJ SOLN
10.0000 mg | INTRAMUSCULAR | Status: AC
  Administered 2020-03-10: 10 mg via INTRAMUSCULAR
  Filled 2020-03-10: qty 1

## 2020-03-10 MED ORDER — BETAMETHASONE SOD PHOS & ACET 6 (3-3) MG/ML IJ SUSP
12.0000 mg | INTRAMUSCULAR | Status: DC
  Administered 2020-03-10: 12 mg via INTRAMUSCULAR
  Filled 2020-03-10: qty 5

## 2020-03-10 NOTE — MAU Note (Signed)
Presents with ctxs since yesterday, reports ctxs every 5-10  Minutes.  Denies LOF or VB.  Reports +FM.

## 2020-03-10 NOTE — MAU Provider Note (Signed)
History     CSN: [id number]  Arrival date and time: 03/10/20 1615   None     Chief Complaint  Patient presents with   Contractions   HPI  Patient is a 29 y/o G2p0101 at 46w6dwho presents today for contractions. She reports contractions started last evening around 9 pm and have picked up in intensity and frequency. They are painful but she can talk through them. She denies leakage of fluid or vaginal bleeding. Fetal movement is normal. This pregnancy has been otherwise uncomplicated. She reports having a cervical exam one month ago and she was closed and long.   She has a Hx of preterm delivery at 324w6dAlso has hx of preeclampsia with severe features. No BP issues during this pregnancy.    OB History    Gravida  2   Para  1   Term  0   Preterm  1   AB  0   Living  1     SAB  0   TAB  0   Ectopic  0   Multiple  0   Live Births  1           Past Medical History:  Diagnosis Date   Anxiety    Asthma    Atypical chest pain 09/23/2019   Atypical chest pain 09/23/2019   Depression    Irregular heart beat    Shortness of breath 09/23/2019   Shortness of breath 09/23/2019   [place]   Tobacco abuse 09/23/2019    Past Surgical History:  Procedure Laterality Date   NO PAST SURGERIES      Family History  Problem Relation Age of Onset   Sickle cell anemia Father    Breast cancer Maternal Aunt        pt doesn't know age of diagnosis   Hypertension Mother    Arthritis Mother    ADD / ADHD Paternal Uncle    Arthritis Maternal Grandmother    Hypertension Maternal Grandmother    Diabetes Paternal Grandmother    Stroke Paternal Grandmother     Social History   Tobacco Use   Smoking status: Current Some Day Smoker    Types: Cigarettes    Last attempt to quit: 10/17/2011    Years since quitting: 8.4   Smokeless tobacco: Never Used   Tobacco comment: black n milds   Vaping Use   Vaping Use: Former  Substance Use  Topics   Alcohol use: No   Drug use: No    Allergies:  Allergies  Allergen Reactions   Carrot [Daucus Carota] Itching and Rash   Mushroom Extract Complex Itching and Rash   Peach [Prunus Persica] Itching and Rash    Medications Prior to Admission  Medication Sig Dispense Refill Last Dose   Prenat-FeAsp-Meth-FA-DHA w/o A (PRENATE PIXIE) 10-0.6-0.4-200 MG CAPS Take 1 capsule by mouth daily. 30 capsule 11 03/09/2020 at Unknown time   albuterol (PROVENTIL HFA;VENTOLIN HFA) 108 (90 Base) MCG/ACT inhaler Inhale 1-2 puffs into the lungs every 6 (six) hours as needed for wheezing or shortness of breath.   Unknown at Unknown time   aspirin 81 MG chewable tablet Chew 1 tablet (81 mg total) by mouth daily. (Patient not taking: Reported on 03/04/2020) 30 tablet 8    Blood Pressure Monitoring (BLOOD PRESSURE KIT) DEVI 1 kit by Does not apply route once a week. Check Blood Pressure regularly and record readings into the [employer] App.  Large Cuff.  DX O90.0 1 each 0    cyclobenzaprine (FLEXERIL) 10 MG tablet Take 1 tablet (10 mg total) by mouth 2 (two) times daily as needed for muscle spasms. (Patient not taking: Reported on 03/04/2020) 20 tablet 0    Elastic Bandages & Supports (COMFORT FIT MATERNITY SUPP MED) MISC 1 Device by Does not apply route daily. 1 each 0    omeprazole (PRILOSEC) 20 MG capsule Take 1 capsule (20 mg total) by mouth daily. (Patient not taking: Reported on 03/04/2020) 30 capsule 0    Prenat-Methylfol-Chol-Fish Oil (PRENATAL + COMPLETE MULTI) 0.267 & 373 MG THPK Take 1 tablet by mouth daily. 30 each 11    promethazine (PHENERGAN) 12.5 MG tablet Take 1 tablet (12.5 mg total) by mouth every 6 (six) hours as needed for nausea or vomiting. (Patient not taking: Reported on 03/04/2020) 30 tablet 0     Review of Systems  Constitutional: Negative for activity change and appetite change.  Respiratory: Negative for chest tightness.   Cardiovascular: Negative for chest pain.   Gastrointestinal: Negative for nausea and vomiting.  Musculoskeletal: Negative for myalgias and neck pain.  Neurological: Negative for dizziness.   Physical Exam   Blood pressure 119/72, pulse (!) 102, temperature 98.6 F (37 C), temperature source Oral, resp. rate 20, height '5\' 2"'  (1.575 m), weight 76.4 kg, last menstrual period 06/17/2019, SpO2 99 %.  Physical Exam Vitals and nursing note reviewed.  Constitutional:      Appearance: Normal appearance.  Cardiovascular:     Rate and Rhythm: Normal rate.     Pulses: Normal pulses.  Pulmonary:     Effort: Pulmonary effort is normal.  Genitourinary:    Comments: Chaperone present, SVE 1.5/50/-3  Skin:    General: Skin is warm and dry.  Neurological:     Mental Status: She is alert.  Psychiatric:        Mood and Affect: Mood normal.        Behavior: Behavior normal.     MAU Course  Procedures  MDM Patient assessed at bedside, SVE 1/50/-3, GBS obtained  BMZ x1, nifedipine for tocolysis  Patient monitored, reports decrease in discomfort   NST: Baseline 140, mod variability, pos accels, neg decels  Toco: q2-5 min   Recheck 1.5/50/-3, continues to feel contractions but milder  Second dose nifedipine given, x1 nubaine  Contractions present but mild, patient hungry and asking for dinner  Recheck and still 1.5/50/-3 five hours from initial SVE    Assessment and Plan   False Labor -contracting regularly but cervix has not changed since admission  -given hx of preterm labor, prior exam with closed cervix, BMZ given, will return tomorrow for second dose BMZ. Patient has appointment at [facility] in AM -discussed strict return precautions with patient, she verbalizes understanding   Janet Berlin 03/10/2020, 5:18 PM

## 2020-03-11 ENCOUNTER — Encounter: Payer: Self-pay | Admitting: Obstetrics

## 2020-03-11 ENCOUNTER — Other Ambulatory Visit (HOSPITAL_COMMUNITY)
Admission: RE | Admit: 2020-03-11 | Discharge: 2020-03-11 | Disposition: A | Discharge status: Home / Self Care | Payer: Medicaid Other | Source: Ambulatory Visit | Attending: Obstetrics | Admitting: Obstetrics

## 2020-03-11 ENCOUNTER — Ambulatory Visit (INDEPENDENT_AMBULATORY_CARE_PROVIDER_SITE_OTHER): Payer: Medicaid Other | Admitting: Obstetrics

## 2020-03-11 ENCOUNTER — Encounter (HOSPITAL_COMMUNITY): Payer: Self-pay | Admitting: Obstetrics & Gynecology

## 2020-03-11 ENCOUNTER — Other Ambulatory Visit: Payer: Self-pay

## 2020-03-11 ENCOUNTER — Inpatient Hospital Stay (HOSPITAL_COMMUNITY)
Admission: AD | Admit: 2020-03-11 | Discharge: 2020-03-11 | Disposition: A | Discharge status: Home / Self Care | Payer: Medicaid Other | Attending: Obstetrics & Gynecology | Admitting: Obstetrics & Gynecology

## 2020-03-11 VITALS — BP 137/87 | HR 95 | Wt 169.0 lb

## 2020-03-11 DIAGNOSIS — Z79899 Other long term (current) drug therapy: Secondary | ICD-10-CM | POA: Diagnosis not present

## 2020-03-11 DIAGNOSIS — O4703 False labor before 37 completed weeks of gestation, third trimester: Secondary | ICD-10-CM

## 2020-03-11 DIAGNOSIS — O09293 Supervision of pregnancy with other poor reproductive or obstetric history, third trimester: Secondary | ICD-10-CM

## 2020-03-11 DIAGNOSIS — Z3A36 36 weeks gestation of pregnancy: Secondary | ICD-10-CM | POA: Insufficient documentation

## 2020-03-11 DIAGNOSIS — O09299 Supervision of pregnancy with other poor reproductive or obstetric history, unspecified trimester: Secondary | ICD-10-CM

## 2020-03-11 DIAGNOSIS — O99333 Smoking (tobacco) complicating pregnancy, third trimester: Secondary | ICD-10-CM | POA: Insufficient documentation

## 2020-03-11 DIAGNOSIS — O0990 Supervision of high risk pregnancy, unspecified, unspecified trimester: Secondary | ICD-10-CM

## 2020-03-11 DIAGNOSIS — F17210 Nicotine dependence, cigarettes, uncomplicated: Secondary | ICD-10-CM | POA: Diagnosis not present

## 2020-03-11 DIAGNOSIS — O479 False labor, unspecified: Secondary | ICD-10-CM

## 2020-03-11 MED ORDER — BETAMETHASONE SOD PHOS & ACET 6 (3-3) MG/ML IJ SUSP
12.0000 mg | Freq: Once | INTRAMUSCULAR | Status: AC
  Administered 2020-03-11: 12 mg via INTRAMUSCULAR

## 2020-03-11 MED ORDER — ONDANSETRON 4 MG PO TBDP
8.0000 mg | ORAL_TABLET | Freq: Once | ORAL | Status: AC
  Administered 2020-03-11: 8 mg via ORAL
  Filled 2020-03-11: qty 2

## 2020-03-11 NOTE — Discharge Instructions (Signed)
Braxton Hicks Contractions Contractions of the uterus can occur throughout pregnancy, but they are not always a sign that you are in labor. You may have practice contractions called Braxton Hicks contractions. These false labor contractions are sometimes confused with true labor. What are Braxton Hicks contractions? Braxton Hicks contractions are tightening movements that occur in the muscles of the uterus before labor. Unlike true labor contractions, these contractions do not result in opening (dilation) and thinning of the cervix. Toward the end of pregnancy (32-34 weeks), Braxton Hicks contractions can happen more often and may become stronger. These contractions are sometimes difficult to tell apart from true labor because they can be very uncomfortable. You should not feel embarrassed if you go to the hospital with false labor. Sometimes, the only way to tell if you are in true labor is for your health care provider to look for changes in the cervix. The health care provider will do a physical exam and may monitor your contractions. If you are not in true labor, the exam should show that your cervix is not dilating and your water has not broken. If there are no other health problems associated with your pregnancy, it is completely safe for you to be sent home with false labor. You may continue to have Braxton Hicks contractions until you go into true labor. How to tell the difference between true labor and false labor True labor  Contractions last 30-70 seconds.  Contractions become very regular.  Discomfort is usually felt in the top of the uterus, and it spreads to the lower abdomen and low back.  Contractions do not go away with walking.  Contractions usually become more intense and increase in frequency.  The cervix dilates and gets thinner. False labor  Contractions are usually shorter and not as strong as true labor contractions.  Contractions are usually irregular.  Contractions  are often felt in the front of the lower abdomen and in the groin.  Contractions may go away when you walk around or change positions while lying down.  Contractions get weaker and are shorter-lasting as time goes on.  The cervix usually does not dilate or become thin. Follow these instructions at home:   Take over-the-counter and prescription medicines only as told by your health care provider.  Keep up with your usual exercises and follow other instructions from your health care provider.  Eat and drink lightly if you think you are going into labor.  If Braxton Hicks contractions are making you uncomfortable: ? Change your position from lying down or resting to walking, or change from walking to resting. ? Sit and rest in a tub of warm water. ? Drink enough fluid to keep your urine pale yellow. Dehydration may cause these contractions. ? Do slow and deep breathing several times an hour.  Keep all follow-up prenatal visits as told by your health care provider. This is important. Contact a health care provider if:  You have a fever.  You have continuous pain in your abdomen. Get help right away if:  Your contractions become stronger, more regular, and closer together.  You have fluid leaking or gushing from your vagina.  You pass blood-tinged mucus (bloody show).  You have bleeding from your vagina.  You have low back pain that you never had before.  You feel your baby's head pushing down and causing pelvic pressure.  Your baby is not moving inside you as much as it used to. Summary  Contractions that occur before labor are   called Braxton Hicks contractions, false labor, or practice contractions.  Braxton Hicks contractions are usually shorter, weaker, farther apart, and less regular than true labor contractions. True labor contractions usually become progressively stronger and regular, and they become more frequent.  Manage discomfort from Braxton Hicks contractions  by changing position, resting in a warm bath, drinking plenty of water, or practicing deep breathing. This information is not intended to replace advice given to you by your health care provider. Make sure you discuss any questions you have with your health care provider. Document Revised: 06/15/2017 Document Reviewed: 11/16/2016 Elsevier Patient Education  2020 [employer] Fetal Movement Counts Patient Name: ________________________________________________ Patient Due Date: ____________________ What is a fetal movement count?  A fetal movement count is the number of times that you feel your baby move during a certain amount of time. This may also be called a fetal kick count. A fetal movement count is recommended for every pregnant woman. You may be asked to start counting fetal movements as early as week 28 of your pregnancy. Pay attention to when your baby is most active. You may notice your baby's sleep and wake cycles. You may also notice things that make your baby move more. You should do a fetal movement count:  When your baby is normally most active.  At the same time each day. A good time to count movements is while you are resting, after having something to eat and drink. How do I count fetal movements? 1. Find a quiet, comfortable area. Sit, or lie down on your side. 2. Write down the date, the start time and stop time, and the number of movements that you felt between those two times. Take this information with you to your health care visits. 3. Write down your start time when you feel the first movement. 4. Count kicks, flutters, swishes, rolls, and jabs. You should feel at least 10 movements. 5. You may stop counting after you have felt 10 movements, or if you have been counting for 2 hours. Write down the stop time. 6. If you do not feel 10 movements in 2 hours, contact your health care provider for further instructions. Your health care provider may want to do additional tests  to assess your baby's well-being. Contact a health care provider if:  You feel fewer than 10 movements in 2 hours.  Your baby is not moving like he or she usually does. Date: ____________ Start time: ____________ Stop time: ____________ Movements: ____________ Date: ____________ Start time: ____________ Stop time: ____________ Movements: ____________ Date: ____________ Start time: ____________ Stop time: ____________ Movements: ____________ Date: ____________ Start time: ____________ Stop time: ____________ Movements: ____________ Date: ____________ Start time: ____________ Stop time: ____________ Movements: ____________ Date: ____________ Start time: ____________ Stop time: ____________ Movements: ____________ Date: ____________ Start time: ____________ Stop time: ____________ Movements: ____________ Date: ____________ Start time: ____________ Stop time: ____________ Movements: ____________ Date: ____________ Start time: ____________ Stop time: ____________ Movements: ____________ This information is not intended to replace advice given to you by your health care provider. Make sure you discuss any questions you have with your health care provider. Document Revised: 02/20/2019 Document Reviewed: 02/20/2019 Elsevier Patient Education  2020 [employer] Inc[employer]  

## 2020-03-11 NOTE — Progress Notes (Signed)
Subjective:  Jill Reed is a 29 y.o. G2P0101 at [redacted]w[redacted]d being seen today for ongoing prenatal care.  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  Patient reports occasional contractions.  Contractions: Irregular. Vag. Bleeding: None.  Movement: Present. Denies leaking of fluid.   The following portions of the patient's history were reviewed and updated as appropriate: allergies, current medications, past family history, past medical history, past social history, past surgical history and problem list. Problem list updated.  Objective:   Vitals:   03/11/20 0955  BP: 137/87  Pulse: 95  Weight: 169 lb (76.7 kg)    Fetal Status:     Movement: Present     General:  Alert, oriented and cooperative. Patient is in no acute distress.  Skin: Skin is warm and dry. No rash noted.   Cardiovascular: Normal heart rate noted  Respiratory: Normal respiratory effort, no problems with respiration noted  Abdomen: Soft, gravid, appropriate for gestational age. Pain/Pressure: Present     Pelvic:  Cervical exam deferred        Extremities: Normal range of motion.     Mental Status: Normal mood and affect. Normal behavior. Normal judgment and thought content.   Urinalysis:      Assessment and Plan:  Pregnancy: G2P0101 at [redacted]w[redacted]d  1. Supervision of high risk pregnancy, antepartum  2. Preterm labor in third trimester without delivery Rx: - Cervicovaginal ancillary only( [facility])  3. History of pre-eclampsia in prior pregnancy, currently pregnant   Term  labor symptoms and general obstetric precautions including but not limited to vaginal bleeding, contractions, leaking of fluid and fetal movement were reviewed in detail with the patient. Please refer to After Visit Summary for other counseling recommendations.   Return in about 1 week (around 03/18/2020) for MyChart HOB-Faculty Only.   Brock Bad, MD  03/11/20

## 2020-03-11 NOTE — MAU Note (Signed)
.   Jill Reed is a 29 y.o. at [redacted]w[redacted]d here in MAU reporting: she was told to come in and get 2nd dose of BMZ. Reports contractions are much worse today. Denies any VB or LOF  Onset of complaint: ongoing Pain score: 8 Vitals:   03/11/20 1853  BP: 124/80  Pulse: (!) 102  Resp: 18  Temp: 98.7 F (37.1 C)     FHT135 Lab orders placed from triage:

## 2020-03-11 NOTE — MAU Provider Note (Signed)
History     CSN: [id number]  Arrival date and time: 03/11/20 5053   First Provider Initiated Contact with Patient 03/11/20 1929      Chief Complaint  Patient presents with  . BMZ  . Contractions   HPI  [name] is a 29 y.o. G2P0101 at 87w0dwho presents to MAU today with complaint of contractions. She is here for her scheduled second BMZ injection, but also complaining of worsening contractions since this afternoon. She denies vaginal bleeding, discharge, LOF. She reports normal fetal movement.   OB History    Gravida  2   Para  1   Term  0   Preterm  1   AB  0   Living  1     SAB  0   TAB  0   Ectopic  0   Multiple  0   Live Births  1           Past Medical History:  Diagnosis Date  . Anxiety   . Asthma   . Atypical chest pain 09/23/2019  . Atypical chest pain 09/23/2019  . Depression   . Irregular heart beat   . Shortness of breath 09/23/2019  . Shortness of breath 09/23/2019  . Sickle cell trait ([place]   . Tobacco abuse 09/23/2019    Past Surgical History:  Procedure Laterality Date  . NO PAST SURGERIES      Family History  Problem Relation Age of Onset  . Sickle cell anemia Father   . Breast cancer Maternal Aunt        pt doesn't know age of diagnosis  . Hypertension Mother   . Arthritis Mother   . ADD / ADHD Paternal Uncle   . Arthritis Maternal Grandmother   . Hypertension Maternal Grandmother   . Diabetes Paternal Grandmother   . Stroke Paternal Grandmother     Social History   Tobacco Use  . Smoking status: Current Some Day Smoker    Types: Cigarettes    Last attempt to quit: 10/17/2011    Years since quitting: 8.4  . Smokeless tobacco: Never Used  . Tobacco comment: black n milds   Vaping Use  . Vaping Use: Former  Substance Use Topics  . Alcohol use: No  . Drug use: No    Allergies:  Allergies  Allergen Reactions  . Carrot [Daucus Carota] Itching and Rash  . Mushroom Extract Complex Itching and Rash  . Peach  [Prunus Persica] Itching and Rash    Medications Prior to Admission  Medication Sig Dispense Refill Last Dose  . albuterol (PROVENTIL HFA;VENTOLIN HFA) 108 (90 Base) MCG/ACT inhaler Inhale 1-2 puffs into the lungs every 6 (six) hours as needed for wheezing or shortness of breath.     .Marland Kitchenaspirin 81 MG chewable tablet Chew 1 tablet (81 mg total) by mouth daily. (Patient not taking: Reported on 03/04/2020) 30 tablet 8   . betamethasone acetate-betamethasone sodium phosphate (CELESTONE) 6 (3-3) MG/ML injection Inject 2 mLs (12 mg total) into the muscle every 24 hours x 2 doses. 5 mL    . Blood Pressure Monitoring (BLOOD PRESSURE KIT) DEVI 1 kit by Does not apply route once a week. Check Blood Pressure regularly and record readings into the [employer] App.  Large Cuff.  DX O90.0 1 each 0   . cyclobenzaprine (FLEXERIL) 10 MG tablet Take 1 tablet (10 mg total) by mouth 2 (two) times daily as needed for muscle spasms. (Patient not taking: Reported  on 03/04/2020) 20 tablet 0   . Elastic Bandages & Supports (COMFORT FIT MATERNITY SUPP MED) MISC 1 Device by Does not apply route daily. 1 each 0   . omeprazole (PRILOSEC) 20 MG capsule Take 1 capsule (20 mg total) by mouth daily. (Patient not taking: Reported on 03/04/2020) 30 capsule 0   . Prenat-FeAsp-Meth-FA-DHA w/o A (PRENATE PIXIE) 10-0.6-0.4-200 MG CAPS Take 1 capsule by mouth daily. 30 capsule 11   . Prenat-Methylfol-Chol-Fish Oil (PRENATAL + COMPLETE MULTI) 0.267 & 373 MG THPK Take 1 tablet by mouth daily. 30 each 11   . promethazine (PHENERGAN) 12.5 MG tablet Take 1 tablet (12.5 mg total) by mouth every 6 (six) hours as needed for nausea or vomiting. (Patient not taking: Reported on 03/04/2020) 30 tablet 0     Review of Systems  Constitutional: Negative for fever.  Gastrointestinal: Positive for abdominal pain and nausea. Negative for constipation, diarrhea and vomiting.  Genitourinary: Negative for vaginal bleeding and vaginal discharge.   Physical  Exam   Blood pressure 124/80, pulse (!) 102, temperature 98.7 F (37.1 C), resp. rate 18, last menstrual period 06/17/2019.  Physical Exam Vitals and nursing note reviewed.  Constitutional:      General: She is not in acute distress.    Appearance: She is well-developed.  HENT:     Head: Normocephalic and atraumatic.  Cardiovascular:     Rate and Rhythm: Tachycardia present.  Pulmonary:     Effort: Pulmonary effort is normal.  Abdominal:     General: There is no distension.     Palpations: Abdomen is soft. There is no mass.     Tenderness: There is no abdominal tenderness. There is no guarding or rebound.  Skin:    General: Skin is warm and dry.     Findings: No erythema.  Neurological:     Mental Status: She is alert and oriented to person, place, and time.    RN cervical exam:  Dilation: 1.5 Effacement (%): 50 Cervical Position: Posterior Station: -3 Presentation: Vertex Exam by:: K.Wilson<>RN   Fetal Monitoring: Baseline: 120 bpm Variability: moderate Accelerations: 15 x 15 Decelerations: none Contractions: q 2-5 minutes, then after PO hydration q 5-6 minutes  MAU Course  Procedures None  MDM BMZ given  ODT Zofran for nausea given Patient has PO hydrated without emesis Patient feels more comfortable at time of discharge with less frequent and less painful contractions Cervix unchanged from yesterday and after 1 hour  Assessment and Plan  A: SIUP at 41w0dPreterm contractions   P:  Discharge home Increase PO hydration  Preterm labor precautions discussed Patient advised to follow-up with [facility] as scheduled or sooner PRN Patient may return to MAU as needed or if her condition were to change or worsen  JKerry Hough PA-C 03/11/2020, 8:20 PM

## 2020-03-12 LAB — CERVICOVAGINAL ANCILLARY ONLY
Bacterial Vaginitis (gardnerella): POSITIVE — AB
Candida Glabrata: NEGATIVE
Candida Vaginitis: NEGATIVE
Chlamydia: NEGATIVE
Comment: NEGATIVE
Comment: NEGATIVE
Comment: NEGATIVE
Comment: NEGATIVE
Comment: NEGATIVE
Comment: NORMAL
Neisseria Gonorrhea: NEGATIVE
Trichomonas: NEGATIVE

## 2020-03-13 LAB — CULTURE, BETA STREP (GROUP B ONLY)

## 2020-03-13 LAB — OB RESULTS CONSOLE GBS: GBS: NEGATIVE

## 2020-03-15 ENCOUNTER — Other Ambulatory Visit: Payer: Self-pay | Admitting: Obstetrics

## 2020-03-15 DIAGNOSIS — N760 Acute vaginitis: Secondary | ICD-10-CM

## 2020-03-15 DIAGNOSIS — B9689 Other specified bacterial agents as the cause of diseases classified elsewhere: Secondary | ICD-10-CM

## 2020-03-15 MED ORDER — TINIDAZOLE 500 MG PO TABS: 1000.0000 mg | ORAL_TABLET | Freq: Every day | ORAL | 2 refills | Status: DC

## 2020-03-18 ENCOUNTER — Telehealth (INDEPENDENT_AMBULATORY_CARE_PROVIDER_SITE_OTHER): Payer: Medicaid Other | Admitting: Obstetrics

## 2020-03-18 ENCOUNTER — Other Ambulatory Visit: Payer: Self-pay

## 2020-03-18 ENCOUNTER — Encounter: Payer: Self-pay | Admitting: Obstetrics

## 2020-03-18 DIAGNOSIS — O09213 Supervision of pregnancy with history of pre-term labor, third trimester: Secondary | ICD-10-CM

## 2020-03-18 DIAGNOSIS — O99333 Smoking (tobacco) complicating pregnancy, third trimester: Secondary | ICD-10-CM

## 2020-03-18 DIAGNOSIS — O0990 Supervision of high risk pregnancy, unspecified, unspecified trimester: Secondary | ICD-10-CM

## 2020-03-18 DIAGNOSIS — Z8751 Personal history of pre-term labor: Secondary | ICD-10-CM

## 2020-03-18 DIAGNOSIS — Z3A37 37 weeks gestation of pregnancy: Secondary | ICD-10-CM

## 2020-03-18 DIAGNOSIS — F17200 Nicotine dependence, unspecified, uncomplicated: Secondary | ICD-10-CM

## 2020-03-18 DIAGNOSIS — O09299 Supervision of pregnancy with other poor reproductive or obstetric history, unspecified trimester: Secondary | ICD-10-CM

## 2020-03-18 DIAGNOSIS — O09293 Supervision of pregnancy with other poor reproductive or obstetric history, third trimester: Secondary | ICD-10-CM

## 2020-03-18 MED ORDER — METRONIDAZOLE 500 MG PO TABS: 500.0000 mg | ORAL_TABLET | Freq: Two times a day (BID) | ORAL | 0 refills | Status: DC

## 2020-03-18 NOTE — Progress Notes (Signed)
   OBSTETRICS PRENATAL VIRTUAL VISIT ENCOUNTER NOTE  Provider location: Center for [facility] [place]   I connected with Jill Reed on 03/18/20 at 11:00 AM EDT by MyChart Video Encounter at home and verified that I am speaking with the correct person using two identifiers.   I discussed the limitations, risks, security and privacy concerns of performing an evaluation and management service virtually and the availability of in person appointments. I also discussed with the patient that there may be a patient responsible charge related to this service. The patient expressed understanding and agreed to proceed. Subjective:  Jill Reed is a 29 y.o. G2P0101 at [redacted]w[redacted]d being seen today for ongoing prenatal care.  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  Patient reports occasional contractions.  Contractions: Irritability. Vag. Bleeding: None.  Movement: Present. Denies any leaking of fluid.   The following portions of the patient's history were reviewed and updated as appropriate: allergies, current medications, past family history, past medical history, past social history, past surgical history and problem list.   Objective:  There were no vitals filed for this visit.  Fetal Status:     Movement: Present     General:  Alert, oriented and cooperative. Patient is in no acute distress.  Respiratory: Normal respiratory effort, no problems with respiration noted  Mental Status: Normal mood and affect. Normal behavior. Normal judgment and  thought content.  Rest of physical exam deferred due to type of encounter  Imaging: No results found.  Assessment and Plan:  Pregnancy: G2P0101 at [redacted]w[redacted]d 1. Supervision of high risk pregnancy, antepartum  2. History of pre-eclampsia in prior pregnancy, currently pregnant  3. History of preterm delivery  4. Tobacco dependence   Term labor symptoms and general obstetric precautions including but not limited to vaginal bleeding, contractions, leaking of fluid and fetal movement were reviewed in detail with the patient. I discussed the assessment and treatment plan with the patient. The patient was provided an opportunity to ask questions and all were answered. The patient agreed with the plan and demonstrated an understanding of the instructions. The patient was advised to call back or seek an in-person office evaluation/go to MAU at [facility] for any urgent or concerning symptoms. Please refer to After Visit Summary for other counseling recommendations.   I provided 10 minutes of face-to-face time during this encounter.  Return in about 1 week (around 03/25/2020) for [facility].   Coral Ceo, MD Center for [facility] 03/18/20

## 2020-03-18 NOTE — Progress Notes (Signed)
Virtual ROB  Recent MAU visit for Contractions

## 2020-03-18 NOTE — Progress Notes (Signed)
S/w pt and advised of +BV, advised would send metronidazole if previous rx is not covered, pt agreed.

## 2020-03-20 ENCOUNTER — Inpatient Hospital Stay (HOSPITAL_COMMUNITY)
Admission: AD | Admit: 2020-03-20 | Discharge: 2020-03-20 | Disposition: A | Discharge status: Home / Self Care | Payer: Medicaid Other | Attending: Obstetrics and Gynecology | Admitting: Obstetrics and Gynecology

## 2020-03-20 ENCOUNTER — Other Ambulatory Visit: Payer: Self-pay

## 2020-03-20 ENCOUNTER — Encounter (HOSPITAL_COMMUNITY): Payer: Self-pay | Admitting: Obstetrics and Gynecology

## 2020-03-20 DIAGNOSIS — Z3A37 37 weeks gestation of pregnancy: Secondary | ICD-10-CM

## 2020-03-20 DIAGNOSIS — O471 False labor at or after 37 completed weeks of gestation: Secondary | ICD-10-CM | POA: Diagnosis not present

## 2020-03-20 DIAGNOSIS — O479 False labor, unspecified: Secondary | ICD-10-CM

## 2020-03-20 NOTE — MAU Provider Note (Signed)
S: Patient is here for RN labor evaluation. Strip, vital signs, & chart Reviewed   O:  Vitals:   03/20/20 2222 03/20/20 2349  BP: 121/68 119/71  Pulse: (!) 103 86  Resp: 18   Temp: 98.6 F (37 C)   TempSrc: Oral   SpO2: 100%   Weight: 77.6 kg    No results found for this or any previous visit (from the past 24 hour(s)).  Dilation: 1.5 Effacement (%): Thick Cervical Position: Posterior Station: -3, -2 Presentation: Vertex Exam by:: Diana Ansah-Mensah, rnc    FHR: 135 bpm, Mod Var, no Decels, 15x15 Accels UC: Q3-8 minutes   A: 1. False labor   2. [redacted] weeks gestation of pregnancy      P:  RN to discharge home in stable condition with return precautions & fetal kick counts  Judeth Horn FNP 11:49 PM

## 2020-03-20 NOTE — Discharge Instructions (Signed)
Fetal Movement Counts Patient Name: ________________________________________________ Patient Due Date: ____________________ What is a fetal movement count?  A fetal movement count is the number of times that you feel your baby move during a certain amount of time. This may also be called a fetal kick count. A fetal movement count is recommended for every pregnant woman. You may be asked to start counting fetal movements as early as week 28 of your pregnancy. Pay attention to when your baby is most active. You may notice your baby's sleep and wake cycles. You may also notice things that make your baby move more. You should do a fetal movement count:  When your baby is normally most active.  At the same time each day. A good time to count movements is while you are resting, after having something to eat and drink. How do I count fetal movements? 1. Find a quiet, comfortable area. Sit, or lie down on your side. 2. Write down the date, the start time and stop time, and the number of movements that you felt between those two times. Take this information with you to your health care visits. 3. Write down your start time when you feel the first movement. 4. Count kicks, flutters, swishes, rolls, and jabs. You should feel at least 10 movements. 5. You may stop counting after you have felt 10 movements, or if you have been counting for 2 hours. Write down the stop time. 6. If you do not feel 10 movements in 2 hours, contact your health care provider for further instructions. Your health care provider may want to do additional tests to assess your baby's well-being. Contact a health care provider if:  You feel fewer than 10 movements in 2 hours.  Your baby is not moving like he or she usually does. Date: ____________ Start time: ____________ Stop time: ____________ Movements: ____________ Date: ____________ Start time: ____________ Stop time: ____________ Movements: ____________ Date: ____________  Start time: ____________ Stop time: ____________ Movements: ____________ Date: ____________ Start time: ____________ Stop time: ____________ Movements: ____________ Date: ____________ Start time: ____________ Stop time: ____________ Movements: ____________ Date: ____________ Start time: ____________ Stop time: ____________ Movements: ____________ Date: ____________ Start time: ____________ Stop time: ____________ Movements: ____________ Date: ____________ Start time: ____________ Stop time: ____________ Movements: ____________ Date: ____________ Start time: ____________ Stop time: ____________ Movements: ____________ This information is not intended to replace advice given to you by your health care provider. Make sure you discuss any questions you have with your health care provider. Document Revised: 02/20/2019 Document Reviewed: 02/20/2019 Elsevier Patient Education  2020 Elsevier Inc.        Signs and Symptoms of Labor Labor is your body's natural process of moving your baby, placenta, and umbilical cord out of your uterus. The process of labor usually starts when your baby is full-term, between 37 and 40 weeks of pregnancy. How will I know when I am close to going into labor? As your body prepares for labor and the birth of your baby, you may notice the following symptoms in the weeks and days before true labor starts:  Having a strong desire to get your home ready to receive your new baby. This is called nesting. Nesting may be a sign that labor is approaching, and it may occur several weeks before birth. Nesting may involve cleaning and organizing your home.  Passing a small amount of thick, bloody mucus out of your vagina (normal bloody show or losing your mucus plug). This may happen more than a   week before labor begins, or it might occur right before labor begins as the opening of the cervix starts to widen (dilate). For some women, the entire mucus plug passes at once. For others,  smaller portions of the mucus plug may gradually pass over several days.  Your baby moving (dropping) lower in your pelvis to get into position for birth (lightening). When this happens, you may feel more pressure on your bladder and pelvic bone and less pressure on your ribs. This may make it easier to breathe. It may also cause you to need to urinate more often and have problems with bowel movements.  Having "practice contractions" (Braxton Hicks contractions) that occur at irregular (unevenly spaced) intervals that are more than 10 minutes apart. This is also called false labor. False labor contractions are common after exercise or sexual activity, and they will stop if you change position, rest, or drink fluids. These contractions are usually mild and do not get stronger over time. They may feel like: ? A backache or back pain. ? Mild cramps, similar to menstrual cramps. ? Tightening or pressure in your abdomen. Other early symptoms that labor may be starting soon include:  Nausea or loss of appetite.  Diarrhea.  Having a sudden burst of energy, or feeling very tired.  Mood changes.  Having trouble sleeping. How will I know when labor has begun? Signs that true labor has begun may include:  Having contractions that come at regular (evenly spaced) intervals and increase in intensity. This may feel like more intense tightening or pressure in your abdomen that moves to your back. ? Contractions may also feel like rhythmic pain in your upper thighs or back that comes and goes at regular intervals. ? For first-time mothers, this change in intensity of contractions often occurs at a more gradual pace. ? Women who have given birth before may notice a more rapid progression of contraction changes.  Having a feeling of pressure in the vaginal area.  Your water breaking (rupture of membranes). This is when the sac of fluid that surrounds your baby breaks. When this happens, you will notice  fluid leaking from your vagina. This may be clear or blood-tinged. Labor usually starts within 24 hours of your water breaking, but it may take longer to begin. ? Some women notice this as a gush of fluid. ? Others notice that their underwear repeatedly becomes damp. Follow these instructions at home:   When labor starts, or if your water breaks, call your health care provider or nurse care line. Based on your situation, they will determine when you should go in for an exam.  When you are in early labor, you may be able to rest and manage symptoms at home. Some strategies to try at home include: ? Breathing and relaxation techniques. ? Taking a warm bath or shower. ? Listening to music. ? Using a heating pad on the lower back for pain. If you are directed to use heat:  Place a towel between your skin and the heat source.  Leave the heat on for 20-30 minutes.  Remove the heat if your skin turns bright red. This is especially important if you are unable to feel pain, heat, or cold. You may have a greater risk of getting burned. Get help right away if:  You have painful, regular contractions that are 5 minutes apart or less.  Labor starts before you are [redacted] weeks along in your pregnancy.  You have a fever.  You have   a headache that does not go away.  You have bright red blood coming from your vagina.  You do not feel your baby moving.  You have a sudden onset of: ? Severe headache with vision problems. ? Nausea, vomiting, or diarrhea. ? Chest pain or shortness of breath. These symptoms may be an emergency. If your health care provider recommends that you go to the hospital or birth center where you plan to deliver, do not drive yourself. Have someone else drive you, or call emergency services (911 in the [place] Summary  Labor is your body's natural process of moving your baby, placenta, and umbilical cord out of your uterus.  The process of labor usually starts when your baby is  full-term, between 37 and 40 weeks of pregnancy.  When labor starts, or if your water breaks, call your health care provider or nurse care line. Based on your situation, they will determine when you should go in for an exam. This information is not intended to replace advice given to you by your health care provider. Make sure you discuss any questions you have with your health care provider. Document Revised: 04/02/2017 Document Reviewed: 12/08/2016 Elsevier Patient Education  2020 Elsevier Inc.  

## 2020-03-20 NOTE — Progress Notes (Signed)
I have communicated with Judeth Horn, np and reviewed vital signs:  Vitals:   03/20/20 2222 03/20/20 2349  BP: 121/68 119/71  Pulse: (!) 103 86  Resp: 18   Temp: 98.6 F (37 C)   SpO2: 100%     Vaginal exam:  Dilation: 1.5 Effacement (%): Thick Cervical Position: Posterior Station: -3, -2 Presentation: Vertex Exam by:: Karl Ito, rnc ,   Also reviewed contraction pattern and that non-stress test is reactive.  It has been documented that patient is contracting every 6-7 minutes with no cervical change over 1 hour not indicating active labor.  Patient denies any other complaints.  Based on this report provider has given order for discharge.  A discharge order and diagnosis entered by a provider.   Labor discharge instructions reviewed with patient.

## 2020-03-20 NOTE — MAU Note (Signed)
Jill Reed is a 29 y.o. at [redacted]w[redacted]d here in MAU reporting: contractions 10-15 mins apart Denies VB/LOF +FM  Onset of complaint: lastnight Pain score: 9 There were no vitals filed for this visit.   FHT:135

## 2020-03-24 ENCOUNTER — Other Ambulatory Visit: Payer: Self-pay

## 2020-03-24 ENCOUNTER — Encounter (HOSPITAL_COMMUNITY): Payer: Self-pay | Admitting: Obstetrics & Gynecology

## 2020-03-24 ENCOUNTER — Inpatient Hospital Stay (HOSPITAL_COMMUNITY)
Admission: AD | Admit: 2020-03-24 | Discharge: 2020-03-24 | Disposition: A | Discharge status: Home / Self Care | Payer: Medicaid Other | Attending: Obstetrics & Gynecology | Admitting: Obstetrics & Gynecology

## 2020-03-24 DIAGNOSIS — O471 False labor at or after 37 completed weeks of gestation: Secondary | ICD-10-CM | POA: Diagnosis not present

## 2020-03-24 DIAGNOSIS — Z3A37 37 weeks gestation of pregnancy: Secondary | ICD-10-CM

## 2020-03-24 DIAGNOSIS — O479 False labor, unspecified: Secondary | ICD-10-CM

## 2020-03-24 NOTE — MAU Note (Signed)
Started contracting last night. Doesn't know how often they are, getting stronger and closer, 'back is killing her'. No bleeding or leaking. No problems with this preg.  Was 2 cm when last checked.

## 2020-03-26 ENCOUNTER — Ambulatory Visit (INDEPENDENT_AMBULATORY_CARE_PROVIDER_SITE_OTHER): Payer: Medicaid Other | Admitting: Obstetrics and Gynecology

## 2020-03-26 ENCOUNTER — Other Ambulatory Visit: Payer: Self-pay

## 2020-03-26 VITALS — BP 113/74 | HR 89 | Wt 172.0 lb

## 2020-03-26 DIAGNOSIS — D573 Sickle-cell trait: Secondary | ICD-10-CM

## 2020-03-26 DIAGNOSIS — O99513 Diseases of the respiratory system complicating pregnancy, third trimester: Secondary | ICD-10-CM

## 2020-03-26 DIAGNOSIS — J45909 Unspecified asthma, uncomplicated: Secondary | ICD-10-CM

## 2020-03-26 DIAGNOSIS — F1290 Cannabis use, unspecified, uncomplicated: Secondary | ICD-10-CM

## 2020-03-26 DIAGNOSIS — Z720 Tobacco use: Secondary | ICD-10-CM

## 2020-03-26 DIAGNOSIS — O361930 Maternal care for other isoimmunization, third trimester, not applicable or unspecified: Secondary | ICD-10-CM

## 2020-03-26 DIAGNOSIS — Z3A38 38 weeks gestation of pregnancy: Secondary | ICD-10-CM | POA: Insufficient documentation

## 2020-03-26 DIAGNOSIS — O0990 Supervision of high risk pregnancy, unspecified, unspecified trimester: Secondary | ICD-10-CM

## 2020-03-26 DIAGNOSIS — O09299 Supervision of pregnancy with other poor reproductive or obstetric history, unspecified trimester: Secondary | ICD-10-CM

## 2020-03-26 NOTE — Progress Notes (Signed)
Pt is having increase in pressure and ctx. Would like cervix check.

## 2020-03-26 NOTE — Patient Instructions (Signed)

## 2020-03-26 NOTE — Progress Notes (Signed)
   PRENATAL VISIT NOTE  Subjective:  Jill Reed is a 29 y.o. G2P0101 at [redacted]w[redacted]d being seen today for ongoing prenatal care.  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  Patient doing well with no acute concerns today. She reports contractions since 7 am, q 10 minutes.  Contractions: Irregular. Vag. Bleeding: None.  Movement: Present. Denies leaking of fluid.   The following portions of the patient's history were reviewed and updated as appropriate: allergies, current medications, past family history, past medical history, past social history, past surgical history and problem list. Problem list updated.  Objective:   Vitals:   03/26/20 0859  BP: 113/74  Pulse: 89  Weight: 172 lb (78 kg)    Fetal Status: Fetal Heart Rate (bpm): 120   Movement: Present     General:  Alert, oriented and cooperative. Patient is in no acute distress.  Skin: Skin is warm and dry. No rash noted.   Cardiovascular: Normal heart rate noted  Respiratory: Normal respiratory effort, no problems with respiration noted  Abdomen: Soft, gravid, appropriate for gestational age.  Pain/Pressure: Present     Pelvic: Cervical exam performed Dilation: 2 Effacement (%): 60 Station: -2  Extremities: Normal range of motion.     Mental Status:  Normal mood and affect. Normal behavior. Normal judgment and thought content.   Assessment and Plan:  Pregnancy: G2P0101 at [redacted]w[redacted]d  1. Asthma affecting pregnancy in third trimester No issues  2. Supervision  of high risk pregnancy, antepartum IOL scheduled for 04/08/2020  3. Sickle cell trait (HCC)   4. History of pre-eclampsia in prior pregnancy, currently pregnant BP normal, no s/sx of PIH  5. Marijuana use   6. Tobacco abuse   7. 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  Reviewed in detail with patient since she is having contractions at this time  Please refer to After Visit Summary for other counseling recommendations.   Return in about 1 week (around 04/02/2020) for [facility], in person.   Mariel Aloe, MD

## 2020-03-29 ENCOUNTER — Other Ambulatory Visit: Payer: Self-pay

## 2020-03-29 ENCOUNTER — Encounter (HOSPITAL_COMMUNITY): Payer: Self-pay | Admitting: Obstetrics & Gynecology

## 2020-03-29 ENCOUNTER — Inpatient Hospital Stay (HOSPITAL_COMMUNITY)
Admission: AD | Admit: 2020-03-29 | Discharge: 2020-03-29 | Disposition: A | Discharge status: Home / Self Care | Payer: Medicaid Other | Attending: Obstetrics & Gynecology | Admitting: Obstetrics & Gynecology

## 2020-03-29 DIAGNOSIS — O0990 Supervision of high risk pregnancy, unspecified, unspecified trimester: Secondary | ICD-10-CM

## 2020-03-29 DIAGNOSIS — O0993 Supervision of high risk pregnancy, unspecified, third trimester: Secondary | ICD-10-CM | POA: Diagnosis not present

## 2020-03-29 DIAGNOSIS — Z3A38 38 weeks gestation of pregnancy: Secondary | ICD-10-CM | POA: Insufficient documentation

## 2020-03-29 NOTE — MAU Note (Signed)
Having contractions, not as bad as last night, "more in her cootie", feeling pressure.  Ready to have this baby. Pain in lower abd and low back. No bleeding or leaking. Was short of breath when walking her daughter to school this morning, had to get some water and sit down. (no problem currently)

## 2020-03-31 ENCOUNTER — Other Ambulatory Visit: Payer: Self-pay

## 2020-03-31 ENCOUNTER — Encounter: Payer: Medicaid Other | Admitting: Obstetrics and Gynecology

## 2020-03-31 ENCOUNTER — Inpatient Hospital Stay (HOSPITAL_COMMUNITY): Payer: Medicaid Other | Admitting: Anesthesiology

## 2020-03-31 ENCOUNTER — Inpatient Hospital Stay (HOSPITAL_COMMUNITY)
Admission: AD | Admit: 2020-03-31 | Discharge: 2020-04-02 | DRG: 806 | Disposition: A | Discharge status: Home / Self Care | Payer: Medicaid Other | Attending: Obstetrics and Gynecology | Admitting: Obstetrics and Gynecology

## 2020-03-31 ENCOUNTER — Encounter (HOSPITAL_COMMUNITY): Payer: Self-pay | Admitting: Obstetrics & Gynecology

## 2020-03-31 DIAGNOSIS — O9952 Diseases of the respiratory system complicating childbirth: Secondary | ICD-10-CM | POA: Diagnosis present

## 2020-03-31 DIAGNOSIS — O99324 Drug use complicating childbirth: Secondary | ICD-10-CM | POA: Diagnosis present

## 2020-03-31 DIAGNOSIS — O9902 Anemia complicating childbirth: Secondary | ICD-10-CM | POA: Diagnosis present

## 2020-03-31 DIAGNOSIS — O99334 Smoking (tobacco) complicating childbirth: Secondary | ICD-10-CM | POA: Diagnosis present

## 2020-03-31 DIAGNOSIS — O99513 Diseases of the respiratory system complicating pregnancy, third trimester: Secondary | ICD-10-CM | POA: Diagnosis present

## 2020-03-31 DIAGNOSIS — J45909 Unspecified asthma, uncomplicated: Secondary | ICD-10-CM | POA: Diagnosis present

## 2020-03-31 DIAGNOSIS — F1290 Cannabis use, unspecified, uncomplicated: Secondary | ICD-10-CM | POA: Diagnosis present

## 2020-03-31 DIAGNOSIS — F17290 Nicotine dependence, other tobacco product, uncomplicated: Secondary | ICD-10-CM | POA: Diagnosis present

## 2020-03-31 DIAGNOSIS — Z3A38 38 weeks gestation of pregnancy: Secondary | ICD-10-CM | POA: Diagnosis not present

## 2020-03-31 DIAGNOSIS — D573 Sickle-cell trait: Secondary | ICD-10-CM | POA: Diagnosis present

## 2020-03-31 DIAGNOSIS — O0990 Supervision of high risk pregnancy, unspecified, unspecified trimester: Secondary | ICD-10-CM

## 2020-03-31 DIAGNOSIS — Z20822 Contact with and (suspected) exposure to covid-19: Secondary | ICD-10-CM | POA: Diagnosis present

## 2020-03-31 DIAGNOSIS — O4202 Full-term premature rupture of membranes, onset of labor within 24 hours of rupture: Secondary | ICD-10-CM | POA: Diagnosis not present

## 2020-03-31 DIAGNOSIS — Z3A39 39 weeks gestation of pregnancy: Secondary | ICD-10-CM | POA: Diagnosis not present

## 2020-03-31 DIAGNOSIS — O26893 Other specified pregnancy related conditions, third trimester: Secondary | ICD-10-CM | POA: Diagnosis present

## 2020-03-31 DIAGNOSIS — O09299 Supervision of pregnancy with other poor reproductive or obstetric history, unspecified trimester: Secondary | ICD-10-CM

## 2020-03-31 LAB — CBC
HCT: 33.4 % — ABNORMAL LOW (ref 36.0–46.0)
HCT: 31.2 % — ABNORMAL LOW (ref 36.0–46.0)
Hemoglobin: 11.4 g/dL — ABNORMAL LOW (ref 12.0–15.0)
Hemoglobin: 10.7 g/dL — ABNORMAL LOW (ref 12.0–15.0)
MCH: 33.3 pg (ref 26.0–34.0)
MCH: 33 pg (ref 26.0–34.0)
MCHC: 34.1 g/dL (ref 30.0–36.0)
MCHC: 34.3 g/dL (ref 30.0–36.0)
MCV: 97.7 fL (ref 80.0–100.0)
MCV: 96.3 fL (ref 80.0–100.0)
Platelets: 132 10*3/uL — ABNORMAL LOW (ref 150–400)
Platelets: 126 10*3/uL — ABNORMAL LOW (ref 150–400)
RBC: 3.42 MIL/uL — ABNORMAL LOW (ref 3.87–5.11)
RBC: 3.24 MIL/uL — ABNORMAL LOW (ref 3.87–5.11)
RDW: 13.8 % (ref 11.5–15.5)
RDW: 13.9 % (ref 11.5–15.5)
WBC: 10.5 10*3/uL (ref 4.0–10.5)
WBC: 11.5 10*3/uL — ABNORMAL HIGH (ref 4.0–10.5)
nRBC: 0.3 % — ABNORMAL HIGH (ref 0.0–0.2)
nRBC: 0 % (ref 0.0–0.2)

## 2020-03-31 LAB — TYPE AND SCREEN
ABO/RH(D): A POS
Antibody Screen: NEGATIVE

## 2020-03-31 LAB — SARS CORONAVIRUS 2 BY RT PCR (HOSPITAL ORDER, PERFORMED IN ~~LOC~~ HOSPITAL LAB): SARS Coronavirus 2: NEGATIVE

## 2020-03-31 MED ORDER — LACTATED RINGERS IV SOLN: INTRAVENOUS | Status: DC

## 2020-03-31 MED ORDER — EPHEDRINE 5 MG/ML INJ: 10.0000 mg | INTRAVENOUS | Status: DC | PRN

## 2020-03-31 MED ORDER — TERBUTALINE SULFATE 1 MG/ML IJ SOLN
0.2500 mg | Freq: Once | INTRAMUSCULAR | Status: DC | PRN
  Filled 2020-03-31: qty 1

## 2020-03-31 MED ORDER — LACTATED RINGERS IV BOLUS
1000.0000 mL | Freq: Once | INTRAVENOUS | Status: AC
  Administered 2020-03-31: 1000 mL via INTRAVENOUS

## 2020-03-31 MED ORDER — LACTATED RINGERS IV SOLN: 500.0000 mL | Freq: Once | INTRAVENOUS | Status: DC

## 2020-03-31 MED ORDER — OXYCODONE-ACETAMINOPHEN 5-325 MG PO TABS: 1.0000 | ORAL_TABLET | ORAL | Status: DC | PRN

## 2020-03-31 MED ORDER — LIDOCAINE HCL (PF) 1 % IJ SOLN
INTRAMUSCULAR | Status: DC | PRN
  Administered 2020-03-31: 5 mL via EPIDURAL

## 2020-03-31 MED ORDER — SOD CITRATE-CITRIC ACID 500-334 MG/5ML PO SOLN: 30.0000 mL | ORAL | Status: DC | PRN

## 2020-03-31 MED ORDER — OXYCODONE-ACETAMINOPHEN 5-325 MG PO TABS: 2.0000 | ORAL_TABLET | ORAL | Status: DC | PRN

## 2020-03-31 MED ORDER — FENTANYL CITRATE (PF) 100 MCG/2ML IJ SOLN
INTRAMUSCULAR | Status: AC
Start: 2020-03-31 — End: 2020-04-01
  Filled 2020-03-31: qty 2

## 2020-03-31 MED ORDER — PHENYLEPHRINE 40 MCG/ML (10ML) SYRINGE FOR IV PUSH (FOR BLOOD PRESSURE SUPPORT): 80.0000 ug | PREFILLED_SYRINGE | INTRAVENOUS | Status: DC | PRN

## 2020-03-31 MED ORDER — LIDOCAINE HCL (PF) 1 % IJ SOLN: 30.0000 mL | INTRAMUSCULAR | Status: DC | PRN

## 2020-03-31 MED ORDER — OXYTOCIN-SODIUM CHLORIDE 30-0.9 UT/500ML-% IV SOLN
2.5000 [IU]/h | INTRAVENOUS | Status: DC
  Administered 2020-04-01: 2.5 [IU]/h via INTRAVENOUS
  Filled 2020-03-31: qty 500

## 2020-03-31 MED ORDER — FENTANYL-BUPIVACAINE-NACL 0.5-0.125-0.9 MG/250ML-% EP SOLN
12.0000 mL/h | EPIDURAL | Status: DC | PRN
  Filled 2020-03-31: qty 250

## 2020-03-31 MED ORDER — LACTATED RINGERS IV SOLN: 500.0000 mL | INTRAVENOUS | Status: DC | PRN

## 2020-03-31 MED ORDER — OXYTOCIN-SODIUM CHLORIDE 30-0.9 UT/500ML-% IV SOLN
1.0000 m[IU]/min | INTRAVENOUS | Status: DC
  Administered 2020-03-31: 2 m[IU]/min via INTRAVENOUS

## 2020-03-31 MED ORDER — DIPHENHYDRAMINE HCL 50 MG/ML IJ SOLN: 12.5000 mg | INTRAMUSCULAR | Status: DC | PRN

## 2020-03-31 MED ORDER — ONDANSETRON HCL 4 MG/2ML IJ SOLN: 4.0000 mg | Freq: Four times a day (QID) | INTRAMUSCULAR | Status: DC | PRN

## 2020-03-31 MED ORDER — FENTANYL CITRATE (PF) 100 MCG/2ML IJ SOLN
100.0000 ug | INTRAMUSCULAR | Status: DC | PRN
  Administered 2020-03-31: 100 ug via INTRAVENOUS

## 2020-03-31 MED ORDER — FENTANYL CITRATE (PF) 100 MCG/2ML IJ SOLN
100.0000 ug | INTRAMUSCULAR | Status: DC | PRN
  Administered 2020-03-31: 100 ug via INTRAVENOUS
  Filled 2020-03-31: qty 2

## 2020-03-31 MED ORDER — SODIUM CHLORIDE (PF) 0.9 % IJ SOLN
INTRAMUSCULAR | Status: DC | PRN
Start: 2020-03-31 — End: 2020-04-01
  Administered 2020-03-31: 12 mL/h via EPIDURAL

## 2020-03-31 MED ORDER — ACETAMINOPHEN 325 MG PO TABS: 650.0000 mg | ORAL_TABLET | ORAL | Status: DC | PRN

## 2020-03-31 MED ORDER — OXYTOCIN BOLUS FROM INFUSION
333.0000 mL | Freq: Once | INTRAVENOUS | Status: AC
  Administered 2020-04-01: 333 mL via INTRAVENOUS

## 2020-03-31 NOTE — Progress Notes (Signed)
Labor Progress Note Jill Reed is a 29 y.o. G2P0101 at [redacted]w[redacted]d presented for labor management s/p SROM at home.  S: Pt reports pain well controlled s/p epidural. No other concerns at this time. Partner at bedside.  O:  BP 115/65   Pulse 93   Temp 98.7 F (37.1 C) (Oral)   Resp 16   Ht 5\' 2"  (1.575 m)   Wt 79.2 kg   LMP 06/17/2019   SpO2 100%   BMI 31.94 kg/m  EFM: BL 110/moderate variability/+accels, multiple prolonged decels  CVE: Dilation: 5 Effacement (%): 60 Cervical Position: Middle Station: -2 Presentation: Vertex Exam by:: Dr. [email or web address]   A&P: 29 y.o. 26 [redacted]w[redacted]d who presented for labor management s/p SROM (1030 on 9/15) at home. #Labor: Pitocin started at 1909 but discontinued at 2045 secondary to a prolonged deceleration. Plan to restart now given improvement in FHT. #Pain: epidural in place #FWB: previously Category 2 strip secondary to multiple prolonged decels but now resolved s/p maternal repositioning, IVF bolus, and pit break. Will continue to monitor closely. #GBS negative #H/o Asthma: will avoid Hemabate #H/o Preeclampsia: blood pressures wnl since admission.  2046, MD 10:44 PM

## 2020-03-31 NOTE — Anesthesia Procedure Notes (Signed)

## 2020-03-31 NOTE — H&P (Addendum)
OBSTETRIC ADMISSION HISTORY AND PHYSICAL  Jill Reed is a 29 y.o. female G101P0101 with IUP at 61w6dby 759w1dltrasound presenting for contractions and SROM at 1030. She reports +FMs, +LOF, no VB, no blurry vision, headaches or peripheral edema, and RUQ pain.  She plans on breast/bottle feeding. She requests Depo for birth control. 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 Anxiety   . Asthma   . Atypical chest pain 09/23/2019  . Atypical chest pain 09/23/2019  . Depression   . Irregular heart beat   . Shortness of breath 09/23/2019  . Shortness of breath 09/23/2019  . [facility] ([facility] . Tobacco abuse 09/23/2019    Past Surgical History: Past Surgical History:  Procedure Laterality Date  . NO PAST SURGERIES      Obstetrical History: OB History    Gravida  2   Para  1   Term  0   Preterm  1   AB  0   Living  1     SAB  0   TAB  0   Ectopic  0   Multiple  0   Live Births  1           Social History Social History   Socioeconomic History  . Marital status: Single    Spouse name: Not on file  . Number of children: 1  . Years of education: Not on file  . Highest education level: Not on file  Occupational History  . Occupation: unemployed  Tobacco Use  . Smoking status: Current Some Day Smoker    Types: Cigars  . Smokeless tobacco: Never Used  . Tobacco comment: black n milds-last two weeks ago  Vaping Use  . Vaping Use: Never used  Substance and Sexual Activity  .  Alcohol use: No  . Drug use: No  . Sexual activity: Yes    Birth control/protection: None  Other Topics Concern  . Not on file  Social History Narrative  . Not on file   Social Determinants of Health   Financial Resource Strain:   . Difficulty of Paying Living Expenses: Not on file  Food Insecurity:   . Worried About RunCharity fundraiser the Last Year: Not on file  . Ran Out of Food in the Last Year: Not on file  Transportation Needs:   . Lack of Transportation (Medical): Not on file  . Lack of Transportation (Non-Medical): Not on file  Physical Activity:   . Days of Exercise per Week: Not on file  . Minutes of Exercise per Session: Not on file  Stress:   . Feeling of Stress : Not on file  Social Connections:   . Frequency of Communication with Friends and Family: Not on file  . Frequency of Social Gatherings with Friends and Family: Not on file  . Attends Religious Services: Not on file  . Active Member of Clubs or Organizations: Not on file  . Attends CluArchivistetings: Not on file  .  Marital Status: Not on file    Family History: Family History  Problem Relation Age of Onset  . Sickle cell anemia Father   . Breast cancer Maternal Aunt        pt doesn't know age of diagnosis  . Hypertension Mother   . Arthritis Mother   . ADD / ADHD Paternal Uncle   . Arthritis Maternal Grandmother   . Hypertension Maternal Grandmother   . Diabetes Paternal Grandmother   . Stroke Paternal Grandmother     Allergies: Allergies  Allergen Reactions  . Carrot [Daucus Carota] Itching and Rash  . Mushroom Extract Complex Itching and Rash  . Peach [Prunus Persica] Itching and Rash    Medications Prior to Admission  Medication Sig Dispense Refill Last Dose  . metroNIDAZOLE (FLAGYL) 500 MG tablet Take 1 tablet (500 mg total) by mouth 2 (two) times daily. 14 tablet 0 03/30/2020 at Unknown time  . Prenat-FeAsp-Meth-FA-DHA w/o A (PRENATE PIXIE) 10-0.6-0.4-200 MG CAPS Take  1 capsule by mouth daily. 30 capsule 11 03/30/2020 at Unknown time  . albuterol (PROVENTIL HFA;VENTOLIN HFA) 108 (90 Base) MCG/ACT inhaler Inhale 1-2 puffs into the lungs every 6 (six) hours as needed for wheezing or shortness of breath.    at Unknown  . Blood Pressure Monitoring (BLOOD PRESSURE KIT) DEVI 1 kit by Does not apply route once a week. Check Blood Pressure regularly and record readings into the Babyscripts App.  Large Cuff.  DX O90.0 1 each 0   . Elastic Bandages & Supports (COMFORT FIT MATERNITY SUPP MED) MISC 1 Device by Does not apply route daily. 1 each 0   . promethazine (PHENERGAN) 12.5 MG tablet Take 1 tablet (12.5 mg total) by mouth every 6 (six) hours as needed for nausea or vomiting. 30 tablet 0      Review of Systems   All systems reviewed and negative except as stated in HPI  Blood pressure 140/76, pulse (!) 101, temperature 98 F (36.7 C), temperature source Oral, resp. rate 16, height _0  (1.575 m), weight 79.2 kg, last menstrual period 06/17/2019, SpO2 100 %. General appearance: alert, cooperative and no distress Lungs: normal respiratory effort Heart: regular rate and rhythm Abdomen: soft, non-tender; bowel sounds normal Pelvic: Noted below Extremities: Homans sign is negative, no sign of DVT Presentation: cephalic Fetal monitoringBaseline: 130 bpm, Variability: Good {> 6 bpm), Accelerations: 15x15 and Decelerations: Occasional variable decels  Uterine activityFrequency: Every 5-6 minutes and Duration: 60-70 seconds seconds Dilation: 5 Effacement (%): 70 Station: -2 Exam by:: T LYTLE RN    Prenatal labs: ABO, Rh: --/--/A POS (09/15 1137) Antibody: NEG (09/15 1137) Rubella: 3.50 (02/19 1021) RPR: Non Reactive (07/01 0923)  HBsAg: Negative (02/19 1021)  HIV: Non Reactive (07/01 0923)  GBS:  Negative  2 hr Glucola normal Genetic screening: NIPS: LR, female, AFP: neg  Anatomy US normal   Prenatal Transfer Tool  Maternal Diabetes: No Genetic Screening:  Normal Maternal Ultrasounds/Referrals: Normal Fetal Ultrasounds or other Referrals:  None Maternal Substance Abuse:  Yes:  Type: Smoker, Marijuana Significant Maternal Medications:  None Significant Maternal Lab Results: GBS negative  Results for orders placed or performed during the hospital encounter of 03/31/20 (from the past 24 hour(s))  CBC   Collection Time: 03/31/20 11:26 AM  Result Value Ref Range   WBC 10.5 4.0 - 10.5 K/uL   RBC 3.42 (L) 3.87 - 5.11 MIL/uL   Hemoglobin 11.4 (L) 12.0 - 15.0 g/dL   HCT 33.4 (L) 36 - 46 %  MCV 97.7 80.0 - 100.0 fL   MCH 33.3 26.0 - 34.0 pg   MCHC 34.1 30.0 - 36.0 g/dL   RDW 13.8 11.5 - 15.5 %   Platelets 132 (L) 150 - 400 K/uL   nRBC 0.3 (H) 0.0 - 0.2 %  Type and screen [place]   Collection Time: 03/31/20 11:37 AM  Result Value Ref Range   ABO/RH(D) A POS    Antibody Screen NEG    Sample Expiration      04/03/2020,2359 Performed at [facility], [place] [address] [place], [place] [address]   SARS Coronavirus 2 by RT PCR (hospital order, performed in [facility] hospital lab) Nasopharyngeal Nasopharyngeal Swab   Collection Time: 03/31/20 11:40 AM   Specimen: Nasopharyngeal Swab  Result Value Ref Range   SARS Coronavirus 2 NEGATIVE NEGATIVE    Patient Active Problem List   Diagnosis Date Noted  . Indication for care in labor or delivery 03/31/2020  . [redacted] weeks gestation of pregnancy 03/26/2020  . Marijuana use 01/23/2020  . 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 History of pre-eclampsia in prior pregnancy, currently pregnant 09/25/2019  . Tobacco abuse 09/23/2019  . 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 History of preterm labor, current pregnancy, first trimester 09/05/2019  . Irregular heart beat   . Sickle cell trait ([place])   . Asthma affecting pregnancy in third trimester   . Supervision of high risk pregnancy, antepartum 08/29/2019    Assessment/Plan:  Jill Reed  is a 29 y.o. G2P0101 at 48w6dhere for contractions and SROM at 1030 on 9/15.   #Labor: SROM. Contractions every 5-6 minutes that last for 60-70 seconds with moderate pain. Progression of cervical dilation has been observed. Expectant management.  #Pain: Patient is moderately tolerating pain. Administration of IV Fentanyl. Epidural PRN. Reassessed after one hour and pain is well controlled. Continue to monitor. #FWB: Baseline: 130, moderate variability, 15x15 accels, some variable decels. Cat II but overall reassuring due to maintained variability and quick return to baseline.   #ID:  GBS negative #MOF: Breast/Bottle feeding #MOC: Depo #Circ:  Yes, inpatient   CLucina Mellow Student-PA  03/31/2020, 6:02 PM  OB FELLOW HISTORY AND PHYSICAL ATTESTATION  I have seen and examined this patient; I agree with above documentation in the resident's note.   CPhill Myron D.O. OB Fellow  03/31/2020, 6:07 PM

## 2020-03-31 NOTE — Anesthesia Preprocedure Evaluation (Signed)
Anesthesia Evaluation  Patient identified by MRN, date of birth, ID band Patient awake    Reviewed: Allergy & Precautions, NPO status , Patient's Chart, lab work & pertinent test results  Airway Mallampati: II  TM Distance: >3 FB Neck ROM: Full    Dental no notable dental hx. (+) Teeth Intact, Dental Advisory Given   Pulmonary asthma , Current Smoker,    Pulmonary exam normal breath sounds clear to auscultation       Cardiovascular negative cardio ROS Normal cardiovascular exam Rhythm:Regular Rate:Normal     Neuro/Psych    GI/Hepatic negative GI ROS, Neg liver ROS,   Endo/Other  negative endocrine ROS  Renal/GU negative Renal ROS     Musculoskeletal   Abdominal   Peds  Hematology  (+) Sickle cell trait , hgb 11.4 Plt 132   Anesthesia Other Findings   Reproductive/Obstetrics (+) Pregnancy                             Anesthesia Physical Anesthesia Plan  ASA: III  Anesthesia Plan: Epidural   Post-op Pain Management:    Induction:   PONV Risk Score and Plan:   Airway Management Planned:   Additional Equipment:   Intra-op Plan:   Post-operative Plan:   Informed Consent: I have reviewed the patients History and Physical, chart, labs and discussed the procedure including the risks, benefits and alternatives for the proposed anesthesia with the patient or authorized representative who has indicated his/her understanding and acceptance.       Plan Discussed with: CRNA  Anesthesia Plan Comments: (38.6 Wk G2 P1 for LEA)        Anesthesia Quick Evaluation

## 2020-03-31 NOTE — MAU Note (Signed)
Pt presents to MAU w/ SROM at 1030. Clear fluid. Contractions are mild and intermittent. Denies VB. +FM. Was 2cm on 9/13.

## 2020-04-01 ENCOUNTER — Encounter (HOSPITAL_COMMUNITY): Payer: Self-pay | Admitting: Obstetrics and Gynecology

## 2020-04-01 DIAGNOSIS — Z3A39 39 weeks gestation of pregnancy: Secondary | ICD-10-CM

## 2020-04-01 DIAGNOSIS — O4202 Full-term premature rupture of membranes, onset of labor within 24 hours of rupture: Secondary | ICD-10-CM

## 2020-04-01 LAB — RPR: RPR Ser Ql: NONREACTIVE

## 2020-04-01 MED ORDER — ACETAMINOPHEN 325 MG PO TABS
650.0000 mg | ORAL_TABLET | Freq: Four times a day (QID) | ORAL | Status: DC
  Administered 2020-04-01 – 2020-04-02 (×6): 650 mg via ORAL
  Filled 2020-04-01 (×6): qty 2

## 2020-04-01 MED ORDER — SENNOSIDES-DOCUSATE SODIUM 8.6-50 MG PO TABS
2.0000 | ORAL_TABLET | ORAL | Status: DC
  Filled 2020-04-01: qty 2

## 2020-04-01 MED ORDER — SIMETHICONE 80 MG PO CHEW: 80.0000 mg | CHEWABLE_TABLET | ORAL | Status: DC | PRN

## 2020-04-01 MED ORDER — ONDANSETRON HCL 4 MG/2ML IJ SOLN: 4.0000 mg | INTRAMUSCULAR | Status: DC | PRN

## 2020-04-01 MED ORDER — DIBUCAINE (PERIANAL) 1 % EX OINT: 1.0000 "application " | TOPICAL_OINTMENT | CUTANEOUS | Status: DC | PRN

## 2020-04-01 MED ORDER — TETANUS-DIPHTH-ACELL PERTUSSIS 5-2.5-18.5 LF-MCG/0.5 IM SUSP: 0.5000 mL | Freq: Once | INTRAMUSCULAR | Status: DC

## 2020-04-01 MED ORDER — PRENATAL MULTIVITAMIN CH
1.0000 | ORAL_TABLET | Freq: Every day | ORAL | Status: DC
  Administered 2020-04-01 – 2020-04-02 (×2): 1 via ORAL
  Filled 2020-04-01 (×2): qty 1

## 2020-04-01 MED ORDER — COCONUT OIL OIL
1.0000 "application " | TOPICAL_OIL | Status: DC | PRN
  Administered 2020-04-01: 1 via TOPICAL

## 2020-04-01 MED ORDER — DIPHENHYDRAMINE HCL 25 MG PO CAPS: 25.0000 mg | ORAL_CAPSULE | Freq: Four times a day (QID) | ORAL | Status: DC | PRN

## 2020-04-01 MED ORDER — WITCH HAZEL-GLYCERIN EX PADS: 1.0000 "application " | MEDICATED_PAD | CUTANEOUS | Status: DC | PRN

## 2020-04-01 MED ORDER — ONDANSETRON HCL 4 MG PO TABS: 4.0000 mg | ORAL_TABLET | ORAL | Status: DC | PRN

## 2020-04-01 MED ORDER — BENZOCAINE-MENTHOL 20-0.5 % EX AERO: 1.0000 "application " | INHALATION_SPRAY | CUTANEOUS | Status: DC | PRN

## 2020-04-01 MED ORDER — ZOLPIDEM TARTRATE 5 MG PO TABS: 5.0000 mg | ORAL_TABLET | Freq: Every evening | ORAL | Status: DC | PRN

## 2020-04-01 MED ORDER — IBUPROFEN 600 MG PO TABS
600.0000 mg | ORAL_TABLET | Freq: Three times a day (TID) | ORAL | Status: DC
  Administered 2020-04-01 – 2020-04-02 (×5): 600 mg via ORAL
  Filled 2020-04-01 (×5): qty 1

## 2020-04-01 NOTE — Plan of Care (Signed)
  Problem: Education: Goal: Knowledge of condition will improve Outcome: Progressing   Problem: Activity: Goal: Will verbalize the importance of balancing activity with adequate rest periods Outcome: Progressing Goal: Ability to tolerate increased activity will improve Outcome: Progressing   

## 2020-04-01 NOTE — Progress Notes (Signed)
Labor Progress Note Jill Reed is a 29 y.o. G2P0101 at [redacted]w[redacted]d presented for labor management s/p SROM at home.  S: Pt reports intermittent vaginal pressure with contractions. No concerns at this time.  O:  BP 128/88   Pulse 85   Temp 98.7 F (37.1 C) (Oral)   Resp 16   Ht 5\' 2"  (1.575 m)   Wt 79.2 kg   LMP 06/17/2019   SpO2 100%   BMI 31.94 kg/m  EFM: BL 110/moderate variability/+accels, recurrent variable decels  CVE: Dilation: Lip/rim Effacement (%): 100 Cervical Position: Middle Station: 0 Presentation: Vertex Exam by:: Dr. [email or web address]   A&P: 29 y.o. 26 [redacted]w[redacted]d who presented for labor management s/p SROM (1030 on 9/15) at home. #Labor: Pitocin started at 1909 but discontinued at 2045 secondary to a prolonged decelerations. Pt now with recurrent variable decels but reassuringly, good recovery between decels and nearly complete cervical dilation. #Pain: epidural in place #FWB: Category 2 strip given recurrent variable decels but reassuringly, good recovery between decels and good variability. #GBS negative #H/o Asthma: will avoid Hemabate #H/o Preeclampsia: blood pressures wnl since admission.  2046, MD 1:57 AM

## 2020-04-01 NOTE — Clinical Social Work Maternal (Signed)
CLINICAL SOCIAL WORK MATERNAL/CHILD NOTE  Patient Details  Name: Jill Reed MRN: [id number] Date of Birth: 10/13/1990  Date:  06-Mar-2020  Clinical Social Worker Initiating Note:  Jill Pilar, LCSW Date/Time: Initiated:  12/19/19/0255     Child's Name:  Jill Reed   Biological Parents:  Mother, Father Jill Reed, Jill Reed)   Need for Interpreter:  None   Reason for Referral:  Current Substance Use/Substance Use During Pregnancy , Behavioral Health Concerns   Address:  [address] [name]    Phone number:  [phone] (home)     Additional phone number: none   Household Members/Support Persons (HM/SP):   Household Member/Support Person 1   HM/SP Name Relationship DOB or Age  HM/SP -1  Jill Reed  [facility] 09/14/1990  HM/SP -2 Jill Reed  Daughter  06/01/2012  HM/SP -3        HM/SP -4        HM/SP -5        HM/SP -6        HM/SP -7        HM/SP -8          Natural Supports (not living in the home):  Parent   Professional Supports: None   Employment: Unemployed   Type of Work: none   Education:  Some Materials engineer arranged:  n/a  Surveyor, quantity Resources:  Medicaid   Other Resources:  [employer], Food Stamps    Cultural/Religious Considerations Which May Impact Care:  none reported.   Strengths:  Ability to meet basic needs , Compliance with medical plan , Home prepared for child , Pediatrician chosen   Psychotropic Medications:       None   Pediatrician:    [name]  Pediatrician List:   [place] [facility] ([address] [employer])  [place]    [place]      Pediatrician Fax Number:    Risk Factors/Current Problems:  None   Cognitive State:  Insightful , Able to Concentrate , Alert    Mood/Affect:  Relaxed , Comfortable , Calm , Interested    CSW Assessment: CSW consulted as MOB had THC use in  pregnancy as well as MOB has a hx of anxiety and depression. CSW went to speak with MOB at bedside to address further needs.   CSW congratulated MOB and FOB on th\e birth of infant, Jill Reed. CSW advised MOB of HIPPA policy in which MOB expressed that it was for for FOB to remain in the room while CSW spoke with her. CSW understanding and advised MOB of CSW's role and the reason for CSW coming to speak with her. MOB expressed that she was diagnosed with anxiety and depression 7 years ago "after the birth of my last child".. MOB expressed that she was placed on Lautuda but stopped that medication 2 months after starting it in 2014. MOB expressed that she has not been in therapy and expressed no desire for resources at this time as MOB expressed that she has been feeling well. CSW was notified by MOB that she hasn't felt SI or HI and expressed no other mental health diagnosis.   CSW inquired from [facility] on her substance use while pregnant. MOB expressed that she used [facility] around August 2021 "to help me eat". MOB expressed that she would only take "1-2 puffs and that is  it". MOB reported that she dealt with a lot of sickness as well, and THC helped with that. CSW understanding and took time to advise MOB of the hospital drug screen policy. MOB was advised that if infants UDS or CDS return back positive then, CSW would need to make a CPS report. MOB expressed that she understood and expressed no other substance use while pregnant and denied any CPS hx to this CSW.  CSW took time to provide MOB with PPD and SIDS education. MOB was given PPD Checklist to keep track of feelings as they relate to PPD. MOB reported that she thinks that she dealt with PPD after the birth of her last child. MOB expressed feelings of feeling "down and just not like myself". MOB reported that these feelings lasted for a few months and then they subsided. MOB expressed that she has a basinet for infant to sleep in once arrived home. Further care  will be provided at [facility] per MOB.  During this assessment, MOB was very pleasant and engaged. MOB answered questions appropriately and even offered support to infant while getting first bath. MOB reported that she has all needed items to care for infant with no other needs.   CSW will continue to monitor infants CDS and UDS and make CPS report if warranted. No barriers to infant discharging to MOB at this time.   CSW Plan/Description:  No Further Intervention Required/No Barriers to Discharge, Sudden Infant Death Syndrome (SIDS) Education, Perinatal Mood and Anxiety Disorder (PMADs) Education, CSW Will Continue to Monitor Umbilical Cord Tissue Drug Screen Results and Make Report if Warranted, Hospital Drug Screen Policy Information    Jill Reed, LCSWA 04/01/2020, 3:10 PM 

## 2020-04-01 NOTE — Discharge Summary (Addendum)
Postpartum Discharge Summary    Patient Name: Jill Reed DOB: [birth year] MRN: [id number]  Date of admission: 03/31/2020 Delivery date:04/01/2020  Delivering provider: Sharion Settler  Date of discharge: 04/02/2020  Admitting diagnosis: Indication for care in labor or delivery [O75.9] Intrauterine pregnancy: [redacted]w[redacted]d    Secondary diagnosis:  Active Problems:   Asthma affecting pregnancy in third trimester   History of pre-eclampsia in prior pregnancy, currently pregnant   Indication for care in labor or delivery   Vaginal delivery  Additional problems: as noted above   Discharge diagnosis: Vaginal delivery                                          Post partum procedures:none Augmentation: Pitocin Complications: None  Hospital course: Onset of Labor With Vaginal Delivery      29y.o. yo G2P0101 at 338w0das admitted in Latent Labor on 03/31/2020. Patient had an uncomplicated labor course as follows:  Membrane Rupture Time/Date: 10:30 AM ,03/31/2020   Delivery Method:Vaginal, Spontaneous  Episiotomy: None  Lacerations:  None  Patient had an uncomplicated postpartum course.  She is ambulating, tolerating a regular diet, passing flatus, and urinating well. Patient is discharged home in stable condition on 04/02/20.  Newborn Data: Birth date:04/01/2020  Birth time:2:25 AM  Gender:Female  Living status:Living  Apgars:9 ,9  Weight:3470 g   Magnesium Sulfate received: No BMZ received: No Rhophylac:N/A MMR:N/A T-DaP: declined in prenatal period Flu: declined in prenatal period Transfusion:No  Physical exam  Vitals:   04/01/20 1842 04/01/20 2300 04/02/20 0504 04/02/20 0830  BP: 117/77 121/64 115/78   Pulse: 77 82 70 72  Resp: '18 16 18 18  ' Temp: 98.9 F (37.2 C) 98.6 F (37 C) 98.4 F (36.9 C) 98.4 F (36.9 C)  TempSrc: Oral Oral Oral Oral  SpO2:      Weight:      Height:       General: alert, cooperative and no distress Lochia: appropriate Uterine Fundus:  firm Incision: N/A DVT Evaluation: No evidence of DVT seen on physical exam. No significant calf/ankle edema. Labs: Lab Results  Component Value Date   WBC 11.5 (H) 03/31/2020   HGB 10.7 (L) 03/31/2020   HCT 31.2 (L) 03/31/2020   MCV 96.3 03/31/2020   PLT 126 (L) 03/31/2020   CMP Latest Ref Rng & Units 10/03/2019  Glucose 65 - 99 mg/dL 89  BUN 6 - 20 mg/dL 5(L)  Creatinine 0.57 - 1.00 mg/dL 0.54(L)  Sodium 134 - 144 mmol/L 137  Potassium 3.5 - 5.2 mmol/L 3.8  Chloride 96 - 106 mmol/L 100  CO2 20 - 29 mmol/L 20  Calcium 8.7 - 10.2 mg/dL 9.6  Total Protein 6.0 - 8.5 g/dL 7.0  Total Bilirubin 0.0 - 1.2 mg/dL 0.5  Alkaline Phos 39 - 117 IU/L 49  AST 0 - 40 IU/L 14  ALT 0 - 32 IU/L 12   Edinburgh Score: Edinburgh Postnatal Depression Scale Screening Tool 04/01/2020  I have been able to laugh and see the funny side of things. 0  I have looked forward with enjoyment to things. 0  I have blamed myself unnecessarily when things went wrong. 1  I have been anxious or worried for no good reason. 2  I have felt scared or panicky for no good reason. 1  Things have been getting on top of me. 1  I have been so unhappy that I have had difficulty sleeping. 0  I have felt sad or miserable. 0  I have been so unhappy that I have been crying. 1  The thought of harming myself has occurred to me. 0  Edinburgh Postnatal Depression Scale Total 6     After visit meds:  Allergies as of 04/02/2020      Reactions   Carrot [daucus Carota] Itching, Rash   Mushroom Extract Complex Itching, Rash   Peach [prunus Persica] Itching, Rash      Medication List    STOP taking these medications   metroNIDAZOLE 500 MG tablet Commonly known as: FLAGYL     TAKE these medications   acetaminophen 325 MG tablet Commonly known as: Tylenol Take 2 tablets (650 mg total) by mouth every 6 (six) hours.   albuterol 108 (90 Base) MCG/ACT inhaler Commonly known as: VENTOLIN HFA Inhale 1-2 puffs into the lungs  every 6 (six) hours as needed for wheezing or shortness of breath.   Blood Pressure Kit Devi 1 kit by Does not apply route once a week. Check Blood Pressure regularly and record readings into the Babyscripts App.  Large Cuff.  DX O90.0   coconut oil Oil Apply 1 application topically as needed.   Comfort Fit Maternity Supp Med Misc 1 Device by Does not apply route daily.   ibuprofen 600 MG tablet Commonly known as: ADVIL Take 1 tablet (600 mg total) by mouth every 8 (eight) hours.   Prenate Pixie 10-0.6-0.4-200 MG Caps Take 1 capsule by mouth daily.   promethazine 12.5 MG tablet Commonly known as: PHENERGAN Take 1 tablet (12.5 mg total) by mouth every 6 (six) hours as needed for nausea or vomiting.        Discharge home in stable condition Infant Feeding: breast and bottle Infant Disposition:home with mother Discharge instruction: per After Visit Summary and Postpartum booklet. Activity: Advance as tolerated. Pelvic rest for 6 weeks.  Diet: routine diet Future Appointments: Future Appointments  Date Time Provider [place]  04/09/2020 10:20 AM [place] None  04/29/2020 10:30 AM Constant, Vickii Chafe, MD [place] None   Follow up Visit:  [place]. Schedule an appointment as soon as possible for a visit on 04/30/2020.   Specialty: Obstetrics and Gynecology Why: for postpartum appointment Contact information: [address] [place] [phone]               Please schedule this patient for a In person postpartum visit in 4 weeks with the following provider: Any provider. Additional Postpartum F/U: 1 week BP check Low risk pregnancy complicated by: h/o preeclampsia, tobacco use, marijuana use, h/o preterm delivery Delivery mode:  Vaginal, Spontaneous  Anticipated Birth Control:  depo   04/02/2020 Lajean Manes, CNM

## 2020-04-01 NOTE — Lactation Note (Signed)
This note was copied from a baby's chart. Lactation Consultation Note  Patient Name: Jill Reed PZWCH'E Date: 04/01/2020 Reason for consult: Initial assessment  Initial visit with 14 hours old infant of P2 mother. Infant is skin to skin with mother upon arrival.  Mother states infant has been showing some hunger cues and she wants help to latch. Provided support with pillows. Attempted latch football position, right breast. Mother has large, pendulous breasts with some signs of edema and nipples are easily everted but short. Infant latches, but does not suck. After a few attempts, talked to mother about hand expression, demonstrated technique and able to collect ~2 mL in a spoon. Showed how to spoon-feed colostrum to baby. Infant fell asleep, mother placed infant skin to skin again.   Reviewed with mother average size of a NB stomach. Encourage to follow babies' hunger and fullness cues. Reviewed importance to offer the breast 8 to 12 times in a 24-hour period for proper stimulation and to establish good milk supply. Reviewed colostrum benefits for baby. Reviewed breastfeeding basics. Reviewed newborn behavior and expectations during first days of life. Promoted maternal rest, hydration and food intake. Encouraged to contact [facility] for support when ready to breastfeed and recommended to request help for questions or concerns.    All questions answered at this time.   Maternal Data Formula Feeding for Exclusion: No Has patient been taught Hand Expression?: Yes Does the patient have breastfeeding experience prior to this delivery?: No  Feeding Feeding Type: Breast Milk  LATCH Score Latch: Too sleepy or reluctant, no latch achieved, no sucking elicited.  Audible Swallowing: None  Type of Nipple: Everted at rest and after stimulation (short)  Comfort (Breast/Nipple): Soft / non-tender  Hold (Positioning): Assistance needed to correctly position infant at breast and maintain  latch.  LATCH Score: 5  Interventions Interventions: Breast feeding basics reviewed;Assisted with latch;Skin to skin;Hand express;Support pillows;Expressed milk;Adjust position  Lactation Tools Discussed/Used Tools: Other (comment) (spoon) WIC Program: No (Planning to apply)   Consult Status Consult Status: Follow-up Date: 04/02/20 Follow-up type: In-patient    Tecumseh Yeagley A Higuera Ancidey 04/01/2020, 4:29 PM

## 2020-04-01 NOTE — Discharge Instructions (Signed)

## 2020-04-01 NOTE — Anesthesia Postprocedure Evaluation (Signed)
Anesthesia Post Note  Patient: Jill Reed  Procedure(s) Performed: AN AD HOC LABOR EPIDURAL     Patient location during evaluation: Mother Baby Anesthesia Type: Epidural Level of consciousness: awake and alert Pain management: pain level controlled Vital Signs Assessment: post-procedure vital signs reviewed and stable Respiratory status: spontaneous breathing, nonlabored ventilation and respiratory function stable Cardiovascular status: stable Postop Assessment: no headache, no backache and epidural receding Anesthetic complications: no   No complications documented.  Last Vitals:  Vitals:   04/01/20 0950 04/01/20 1329  BP: 113/80 119/76  Pulse: 62 74  Resp: 18 17  Temp: 37.3 C 36.9 C  SpO2: 100% 99%    Last Pain:  Vitals:   04/01/20 1430  TempSrc:   PainSc: 7    Pain Goal: Patients Stated Pain Goal: 2 (04/01/20 1430)                 [name]

## 2020-04-02 ENCOUNTER — Encounter: Payer: Medicaid Other | Admitting: Obstetrics and Gynecology

## 2020-04-02 MED ORDER — COCONUT OIL OIL: 1.0000 "application " | TOPICAL_OIL | 0 refills | Status: DC | PRN

## 2020-04-02 MED ORDER — IBUPROFEN 600 MG PO TABS
600.0000 mg | ORAL_TABLET | Freq: Three times a day (TID) | ORAL | 0 refills | Status: DC
Start: 2020-04-02 — End: 2020-05-19

## 2020-04-02 MED ORDER — ACETAMINOPHEN 325 MG PO TABS
650.0000 mg | ORAL_TABLET | Freq: Four times a day (QID) | ORAL | 0 refills | Status: DC
Start: 2020-04-02 — End: 2020-05-19

## 2020-04-08 ENCOUNTER — Inpatient Hospital Stay (HOSPITAL_COMMUNITY): Payer: Medicaid Other

## 2020-04-08 ENCOUNTER — Inpatient Hospital Stay (HOSPITAL_COMMUNITY)
Admission: AD | Admit: 2020-04-08 | Payer: Medicaid Other | Source: Home / Self Care | Admitting: Obstetrics and Gynecology

## 2020-04-09 ENCOUNTER — Other Ambulatory Visit: Payer: Self-pay

## 2020-04-09 ENCOUNTER — Ambulatory Visit (INDEPENDENT_AMBULATORY_CARE_PROVIDER_SITE_OTHER): Payer: Medicaid Other

## 2020-04-09 VITALS — BP 150/89 | Ht 62.0 in | Wt 169.5 lb

## 2020-04-09 DIAGNOSIS — O135 Gestational [pregnancy-induced] hypertension without significant proteinuria, complicating the puerperium: Secondary | ICD-10-CM

## 2020-04-09 DIAGNOSIS — Z0130 Encounter for examination of blood pressure without abnormal findings: Secondary | ICD-10-CM

## 2020-04-09 MED ORDER — NIFEDIPINE ER OSMOTIC RELEASE 30 MG PO TB24: 30.0000 mg | ORAL_TABLET | Freq: Every day | ORAL | 2 refills | Status: DC

## 2020-04-09 NOTE — Progress Notes (Signed)
.  Subjective:  Jill Reed is a 29 y.o. female here for BP check.   Hypertension ROS: Patient not taking medications, no medications given. No TIA's, no chest pain on exertion, no dyspnea on exertion, no swelling of ankles. Patient did experience headaches yesterday.     Objective:  Ht 5\' 2"  (1.575 m)   Wt 169 lb 8 oz (76.9 kg)   Breastfeeding No   BMI 31.00 kg/m   BP: 161/83 150/89 Appearance alert, well appearing, and in no distress. General exam BP noted to be well controlled today in office.    Assessment:   Blood Pressure needs improvement.   Plan:  The following changes are to be made: Begin Procardia 30mg  tablet PO daily per Dr. . Return to office in one week for BP Check.

## 2020-04-12 NOTE — Progress Notes (Signed)
Patient was assessed and managed by nursing staff during this encounter. I have reviewed the chart and agree with the documentation and plan. I have also made any necessary editorial changes.  Catalina Antigua, MD 04/12/2020 10:41 AM

## 2020-04-21 ENCOUNTER — Other Ambulatory Visit: Payer: Self-pay

## 2020-04-21 ENCOUNTER — Ambulatory Visit: Payer: Medicaid Other

## 2020-04-21 VITALS — BP 133/80 | HR 87

## 2020-04-21 DIAGNOSIS — Z0130 Encounter for examination of blood pressure without abnormal findings: Secondary | ICD-10-CM

## 2020-04-21 NOTE — Progress Notes (Signed)
..  Subjective:  Jill Reed is a 29 y.o. female here for BP check.   Hypertension ROS: taking medications as instructed, no medication side effects noted, no TIA's, no chest pain on exertion, no dyspnea on exertion and no swelling of ankles.    Objective:  BP 133/80   Pulse 87   Breastfeeding No   Appearance alert, well appearing, and in no distress. General exam BP noted to be well controlled today in office.    Assessment:   Blood Pressure well controlled.   Plan:  Current treatment plan is effective, no change in therapy.. Pt will continue prescribed medication and follow up at pp visit on 04-29-20

## 2020-04-29 ENCOUNTER — Ambulatory Visit: Payer: Medicaid Other | Admitting: Obstetrics and Gynecology

## 2020-05-18 ENCOUNTER — Emergency Department (HOSPITAL_COMMUNITY): Payer: Medicaid Other | Admitting: Certified Registered Nurse Anesthetist

## 2020-05-18 ENCOUNTER — Encounter (HOSPITAL_COMMUNITY)
Admission: EM | Disposition: A | Discharge status: Home / Self Care | Payer: Self-pay | Source: Home / Self Care | Attending: Emergency Medicine

## 2020-05-18 ENCOUNTER — Other Ambulatory Visit: Payer: Self-pay

## 2020-05-18 ENCOUNTER — Emergency Department (HOSPITAL_COMMUNITY): Payer: Medicaid Other

## 2020-05-18 ENCOUNTER — Encounter (HOSPITAL_COMMUNITY): Payer: Self-pay

## 2020-05-18 ENCOUNTER — Ambulatory Visit (HOSPITAL_COMMUNITY)
Admission: EM | Admit: 2020-05-18 | Discharge: 2020-05-19 | Disposition: A | Discharge status: Home / Self Care | Payer: Medicaid Other | Attending: Orthopedic Surgery | Admitting: Orthopedic Surgery

## 2020-05-18 DIAGNOSIS — Z833 Family history of diabetes mellitus: Secondary | ICD-10-CM | POA: Insufficient documentation

## 2020-05-18 DIAGNOSIS — D573 Sickle-cell trait: Secondary | ICD-10-CM | POA: Diagnosis not present

## 2020-05-18 DIAGNOSIS — S6991XA Unspecified injury of right wrist, hand and finger(s), initial encounter: Secondary | ICD-10-CM

## 2020-05-18 DIAGNOSIS — S65111A Laceration of radial artery at wrist and hand level of right arm, initial encounter: Secondary | ICD-10-CM | POA: Diagnosis not present

## 2020-05-18 DIAGNOSIS — Z791 Long term (current) use of non-steroidal anti-inflammatories (NSAID): Secondary | ICD-10-CM | POA: Insufficient documentation

## 2020-05-18 DIAGNOSIS — Z832 Family history of diseases of the blood and blood-forming organs and certain disorders involving the immune mechanism: Secondary | ICD-10-CM | POA: Insufficient documentation

## 2020-05-18 DIAGNOSIS — Z20822 Contact with and (suspected) exposure to covid-19: Secondary | ICD-10-CM | POA: Insufficient documentation

## 2020-05-18 DIAGNOSIS — Z8379 Family history of other diseases of the digestive system: Secondary | ICD-10-CM | POA: Diagnosis not present

## 2020-05-18 DIAGNOSIS — Z23 Encounter for immunization: Secondary | ICD-10-CM | POA: Diagnosis not present

## 2020-05-18 DIAGNOSIS — Z91018 Allergy to other foods: Secondary | ICD-10-CM | POA: Insufficient documentation

## 2020-05-18 DIAGNOSIS — Z79899 Other long term (current) drug therapy: Secondary | ICD-10-CM | POA: Insufficient documentation

## 2020-05-18 DIAGNOSIS — S6411XA Injury of median nerve at wrist and hand level of right arm, initial encounter: Secondary | ICD-10-CM | POA: Diagnosis not present

## 2020-05-18 DIAGNOSIS — S66821A Laceration of other specified muscles, fascia and tendons at wrist and hand level, right hand, initial encounter: Secondary | ICD-10-CM | POA: Insufficient documentation

## 2020-05-18 DIAGNOSIS — J45909 Unspecified asthma, uncomplicated: Secondary | ICD-10-CM | POA: Diagnosis not present

## 2020-05-18 DIAGNOSIS — F17290 Nicotine dependence, other tobacco product, uncomplicated: Secondary | ICD-10-CM | POA: Insufficient documentation

## 2020-05-18 DIAGNOSIS — Z818 Family history of other mental and behavioral disorders: Secondary | ICD-10-CM | POA: Insufficient documentation

## 2020-05-18 DIAGNOSIS — Z8261 Family history of arthritis: Secondary | ICD-10-CM | POA: Insufficient documentation

## 2020-05-18 DIAGNOSIS — Z8249 Family history of ischemic heart disease and other diseases of the circulatory system: Secondary | ICD-10-CM | POA: Diagnosis not present

## 2020-05-18 DIAGNOSIS — Z823 Family history of stroke: Secondary | ICD-10-CM | POA: Insufficient documentation

## 2020-05-18 DIAGNOSIS — Z803 Family history of malignant neoplasm of breast: Secondary | ICD-10-CM | POA: Diagnosis not present

## 2020-05-18 HISTORY — PX: WOUND EXPLORATION: SHX6188

## 2020-05-18 LAB — RESPIRATORY PANEL BY RT PCR (FLU A&B, COVID)
Influenza A by PCR: NEGATIVE
Influenza B by PCR: NEGATIVE
SARS Coronavirus 2 by RT PCR: NEGATIVE

## 2020-05-18 LAB — PREGNANCY, URINE: Preg Test, Ur: NEGATIVE

## 2020-05-18 SURGERY — WOUND EXPLORATION
Anesthesia: General | Site: Wrist

## 2020-05-18 MED ORDER — STERILE WATER FOR IRRIGATION IR SOLN
Status: DC | PRN
  Administered 2020-05-18: 1000 mL

## 2020-05-18 MED ORDER — LACTATED RINGERS IV SOLN: INTRAVENOUS | Status: DC | PRN

## 2020-05-18 MED ORDER — PROPOFOL 10 MG/ML IV BOLUS
INTRAVENOUS | Status: DC | PRN
  Administered 2020-05-18: 200 mg via INTRAVENOUS

## 2020-05-18 MED ORDER — PROPOFOL 10 MG/ML IV BOLUS
INTRAVENOUS | Status: AC
  Filled 2020-05-18: qty 40

## 2020-05-18 MED ORDER — ONDANSETRON HCL 4 MG/2ML IJ SOLN
INTRAMUSCULAR | Status: AC
  Filled 2020-05-18: qty 2

## 2020-05-18 MED ORDER — TETANUS-DIPHTH-ACELL PERTUSSIS 5-2.5-18.5 LF-MCG/0.5 IM SUSY
0.5000 mL | PREFILLED_SYRINGE | Freq: Once | INTRAMUSCULAR | Status: AC
  Administered 2020-05-18: 0.5 mL via INTRAMUSCULAR
  Filled 2020-05-18: qty 0.5

## 2020-05-18 MED ORDER — CHLORHEXIDINE GLUCONATE 0.12 % MT SOLN: 15.0000 mL | Freq: Once | OROMUCOSAL | Status: AC

## 2020-05-18 MED ORDER — MIDAZOLAM HCL 5 MG/5ML IJ SOLN
INTRAMUSCULAR | Status: DC | PRN
  Administered 2020-05-18: 2 mg via INTRAVENOUS

## 2020-05-18 MED ORDER — HYDROCODONE-ACETAMINOPHEN 5-325 MG PO TABS: ORAL_TABLET | ORAL | 0 refills | Status: DC

## 2020-05-18 MED ORDER — BUPIVACAINE HCL (PF) 0.25 % IJ SOLN
INTRAMUSCULAR | Status: DC | PRN
  Administered 2020-05-18: 10 mL

## 2020-05-18 MED ORDER — EPHEDRINE 5 MG/ML INJ
INTRAVENOUS | Status: AC
  Filled 2020-05-18: qty 10

## 2020-05-18 MED ORDER — FENTANYL CITRATE (PF) 100 MCG/2ML IJ SOLN
25.0000 ug | INTRAMUSCULAR | Status: DC | PRN
  Administered 2020-05-19 (×2): 25 ug via INTRAVENOUS
  Administered 2020-05-19: 50 ug via INTRAVENOUS

## 2020-05-18 MED ORDER — MORPHINE SULFATE (PF) 4 MG/ML IV SOLN
4.0000 mg | Freq: Once | INTRAVENOUS | Status: AC
  Administered 2020-05-18: 4 mg via INTRAVENOUS
  Filled 2020-05-18: qty 1

## 2020-05-18 MED ORDER — ACETAMINOPHEN 10 MG/ML IV SOLN
INTRAVENOUS | Status: DC | PRN
  Administered 2020-05-18: 1000 mg via INTRAVENOUS

## 2020-05-18 MED ORDER — CEFAZOLIN SODIUM-DEXTROSE 2-3 GM-%(50ML) IV SOLR
INTRAVENOUS | Status: DC | PRN
  Administered 2020-05-18: 2 g via INTRAVENOUS

## 2020-05-18 MED ORDER — SUCCINYLCHOLINE CHLORIDE 20 MG/ML IJ SOLN
INTRAMUSCULAR | Status: DC | PRN
  Administered 2020-05-18: 100 mg via INTRAVENOUS

## 2020-05-18 MED ORDER — LACTATED RINGERS IV SOLN: INTRAVENOUS | Status: DC

## 2020-05-18 MED ORDER — FENTANYL CITRATE (PF) 100 MCG/2ML IJ SOLN
50.0000 ug | Freq: Once | INTRAMUSCULAR | Status: AC
  Administered 2020-05-18: 50 ug via INTRAVENOUS
  Filled 2020-05-18: qty 2

## 2020-05-18 MED ORDER — FENTANYL CITRATE (PF) 250 MCG/5ML IJ SOLN
INTRAMUSCULAR | Status: DC | PRN
  Administered 2020-05-18 (×2): 50 ug via INTRAVENOUS
  Administered 2020-05-18: 100 ug via INTRAVENOUS
  Administered 2020-05-18: 50 ug via INTRAVENOUS
  Administered 2020-05-18: 100 ug via INTRAVENOUS

## 2020-05-18 MED ORDER — FENTANYL CITRATE (PF) 250 MCG/5ML IJ SOLN
INTRAMUSCULAR | Status: AC
  Filled 2020-05-18: qty 5

## 2020-05-18 MED ORDER — LIDOCAINE HCL (CARDIAC) PF 100 MG/5ML IV SOSY
PREFILLED_SYRINGE | INTRAVENOUS | Status: DC | PRN
  Administered 2020-05-18: 60 mg via INTRATRACHEAL

## 2020-05-18 MED ORDER — BUPIVACAINE HCL (PF) 0.25 % IJ SOLN
INTRAMUSCULAR | Status: AC
  Filled 2020-05-18: qty 30

## 2020-05-18 MED ORDER — 0.9 % SODIUM CHLORIDE (POUR BTL) OPTIME
TOPICAL | Status: DC | PRN
  Administered 2020-05-18: 1000 mL

## 2020-05-18 MED ORDER — SUCCINYLCHOLINE CHLORIDE 200 MG/10ML IV SOSY
PREFILLED_SYRINGE | INTRAVENOUS | Status: AC
  Filled 2020-05-18: qty 10

## 2020-05-18 MED ORDER — ONDANSETRON HCL 4 MG/2ML IJ SOLN
INTRAMUSCULAR | Status: DC | PRN
  Administered 2020-05-18: 4 mg via INTRAVENOUS

## 2020-05-18 MED ORDER — PROMETHAZINE HCL 25 MG/ML IJ SOLN: 6.2500 mg | INTRAMUSCULAR | Status: DC | PRN

## 2020-05-18 MED ORDER — CEFAZOLIN SODIUM 1 G IJ SOLR
INTRAMUSCULAR | Status: AC
  Filled 2020-05-18: qty 20

## 2020-05-18 MED ORDER — CHLORHEXIDINE GLUCONATE 0.12 % MT SOLN
OROMUCOSAL | Status: AC
  Administered 2020-05-18: 15 mL via OROMUCOSAL
  Filled 2020-05-18: qty 15

## 2020-05-18 MED ORDER — PHENYLEPHRINE 40 MCG/ML (10ML) SYRINGE FOR IV PUSH (FOR BLOOD PRESSURE SUPPORT)
PREFILLED_SYRINGE | INTRAVENOUS | Status: AC
  Filled 2020-05-18: qty 10

## 2020-05-18 MED ORDER — MIDAZOLAM HCL 2 MG/2ML IJ SOLN
INTRAMUSCULAR | Status: AC
  Filled 2020-05-18: qty 2

## 2020-05-18 MED ORDER — LIDOCAINE HCL (PF) 1 % IJ SOLN
INTRAMUSCULAR | Status: AC
  Filled 2020-05-18: qty 30

## 2020-05-18 MED ORDER — ACETAMINOPHEN 10 MG/ML IV SOLN
INTRAVENOUS | Status: AC
  Filled 2020-05-18: qty 100

## 2020-05-18 MED ORDER — LIDOCAINE 2% (20 MG/ML) 5 ML SYRINGE
INTRAMUSCULAR | Status: AC
  Filled 2020-05-18: qty 5

## 2020-05-18 SURGICAL SUPPLY — 57 items
BLADE SURG 15 STRL LF DISP TIS (BLADE) ×2 IMPLANT
BLADE SURG 15 STRL SS (BLADE) ×3
BNDG CMPR 9X4 STRL LF SNTH (GAUZE/BANDAGES/DRESSINGS) ×1
BNDG ELASTIC 3X5.8 VLCR STR LF (GAUZE/BANDAGES/DRESSINGS) ×3 IMPLANT
BNDG ESMARK 4X9 LF (GAUZE/BANDAGES/DRESSINGS) ×2 IMPLANT
BNDG GAUZE ELAST 4 BULKY (GAUZE/BANDAGES/DRESSINGS) ×3 IMPLANT
CORD BIPOLAR FORCEPS 12FT (ELECTRODE) ×3 IMPLANT
COVER MAYO STAND STRL (DRAPES) ×3 IMPLANT
COVER SURGICAL LIGHT HANDLE (MISCELLANEOUS) ×3 IMPLANT
COVER WAND RF STERILE (DRAPES) ×3 IMPLANT
CUFF TOURN SGL QUICK 18X4 (TOURNIQUET CUFF) ×2 IMPLANT
DECANTER SPIKE VIAL GLASS SM (MISCELLANEOUS) ×3 IMPLANT
DRAPE SURG 17X23 STRL (DRAPES) ×3 IMPLANT
GAUZE SPONGE 4X4 12PLY STRL (GAUZE/BANDAGES/DRESSINGS) ×3 IMPLANT
GAUZE XEROFORM 1X8 LF (GAUZE/BANDAGES/DRESSINGS) ×3 IMPLANT
GLOVE BIO SURGEON STRL SZ7.5 (GLOVE) ×5 IMPLANT
GLOVE BIOGEL PI IND STRL 8 (GLOVE) ×1 IMPLANT
GLOVE BIOGEL PI INDICATOR 8 (GLOVE) ×4
GLOVE SURG SS PI 6.5 STRL IVOR (GLOVE) ×4 IMPLANT
GOWN STRL NON-REIN LRG LVL3 (GOWN DISPOSABLE) ×2 IMPLANT
GOWN STRL REUS W/ TWL LRG LVL3 (GOWN DISPOSABLE) ×1 IMPLANT
GOWN STRL REUS W/ TWL XL LVL3 (GOWN DISPOSABLE) ×1 IMPLANT
GOWN STRL REUS W/TWL LRG LVL3 (GOWN DISPOSABLE)
GOWN STRL REUS W/TWL XL LVL3 (GOWN DISPOSABLE) ×3
KIT BASIN OR (CUSTOM PROCEDURE TRAY) ×3 IMPLANT
NDL 18GX1X1/2 (RX/OR ONLY) (NEEDLE) IMPLANT
NDL HYPO 25X1 1.5 SAFETY (NEEDLE) IMPLANT
NEEDLE 18GX1X1/2 (RX/OR ONLY) (NEEDLE) IMPLANT
NEEDLE HYPO 25X1 1.5 SAFETY (NEEDLE) IMPLANT
NS IRRIG 1000ML POUR BTL (IV SOLUTION) ×3 IMPLANT
PACK ORTHO EXTREMITY (CUSTOM PROCEDURE TRAY) ×3 IMPLANT
PAD CAST 3X4 CTTN HI CHSV (CAST SUPPLIES) ×1 IMPLANT
PAD CAST 4YDX4 CTTN HI CHSV (CAST SUPPLIES) IMPLANT
PADDING CAST COTTON 3X4 STRL (CAST SUPPLIES)
PADDING CAST COTTON 4X4 STRL (CAST SUPPLIES) ×3
SPEAR EYE SURG WECK-CEL (MISCELLANEOUS) ×9 IMPLANT
SPLINT PLASTER CAST XFAST 3X15 (CAST SUPPLIES) IMPLANT
SPLINT PLASTER EXTRA FAST 3X15 (CAST SUPPLIES) ×2
SPLINT PLASTER GYPS XFAST 3X15 (CAST SUPPLIES) IMPLANT
SPLINT PLASTER XTRA FASTSET 3X (CAST SUPPLIES) ×2
STOCKINETTE 4X48 STRL (DRAPES) ×3 IMPLANT
SUT ETHIBOND 3-0 V-5 (SUTURE) IMPLANT
SUT ETHILON 4 0 PS 2 18 (SUTURE) ×5 IMPLANT
SUT ETHILON 8 0 BV130 4 (SUTURE) ×2 IMPLANT
SUT FIBERWIRE 4-0 18 TAPR NDL (SUTURE) ×3
SUT MERSILENE 6 0 P 1 (SUTURE) IMPLANT
SUT NYLON 9 0 VRM6 (SUTURE) IMPLANT
SUT PROLENE 6 0 P 1 18 (SUTURE) IMPLANT
SUT SILK 4 0 PS 2 (SUTURE) IMPLANT
SUT VICRYL 4-0 PS2 18IN ABS (SUTURE) IMPLANT
SUTURE FIBERWR 4-0 18 TAPR NDL (SUTURE) IMPLANT
SYR BULB EAR ULCER 3OZ GRN STR (SYRINGE) ×3 IMPLANT
SYR CONTROL 10ML LL (SYRINGE) ×2 IMPLANT
TOWEL GREEN STERILE FF (TOWEL DISPOSABLE) ×6 IMPLANT
TUBE CONNECTING 12'X1/4 (SUCTIONS) ×1
TUBE CONNECTING 12X1/4 (SUCTIONS) ×2 IMPLANT
UNDERPAD 30X36 HEAVY ABSORB (UNDERPADS AND DIAPERS) ×3 IMPLANT
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
## 2020-05-18 NOTE — Anesthesia Preprocedure Evaluation (Addendum)
Anesthesia Evaluation  Patient identified by MRN, date of birth, ID band Patient awake    Reviewed: Allergy & Precautions, NPO status , Patient's Chart, lab work & pertinent test results  Airway Mallampati: II  TM Distance: >3 FB Neck ROM: Full    Dental  (+) Teeth Intact   Pulmonary shortness of breath, asthma , Current Smoker and Patient abstained from smoking.,    Pulmonary exam normal breath sounds clear to auscultation       Cardiovascular negative cardio ROS Normal cardiovascular exam Rhythm:Regular Rate:Normal     Neuro/Psych PSYCHIATRIC DISORDERS Anxiety Depression negative neurological ROS     GI/Hepatic negative GI ROS, Neg liver ROS,   Endo/Other  negative endocrine ROS  Renal/GU negative Renal ROS     Musculoskeletal Wrist wound   Abdominal   Peds  Hematology  (+) Blood dyscrasia, Sickle cell trait ,   Anesthesia Other Findings Day of surgery medications reviewed with the patient.  Reproductive/Obstetrics                            Anesthesia Physical Anesthesia Plan  ASA: II  Anesthesia Plan: General   Post-op Pain Management:    Induction: Intravenous  PONV Risk Score and Plan: 2 and Midazolam, Dexamethasone and Ondansetron  Airway Management Planned: Oral ETT  Additional Equipment:   Intra-op Plan:   Post-operative Plan: Extubation in OR  Informed Consent: I have reviewed the patients History and Physical, chart, labs and discussed the procedure including the risks, benefits and alternatives for the proposed anesthesia with the patient or authorized representative who has indicated his/her understanding and acceptance.       Plan Discussed with: CRNA  Anesthesia Plan Comments:        Anesthesia Quick Evaluation

## 2020-05-18 NOTE — Transfer of Care (Signed)
Immediate Anesthesia Transfer of Care Note  Patient: Jill Reed  Procedure(s) Performed: RIGHT WRIST EXPLORATION WITH TENDON, ARTERY AND NERVE REPAIR (N/A Wrist)  Patient Location: PACU  Anesthesia Type:General  Level of Consciousness: drowsy  Airway & Oxygen Therapy: Patient Spontanous Breathing  Post-op Assessment: Report given to RN and Post -op Vital signs reviewed and stable  Post vital signs: Reviewed and stable  Last Vitals:  Vitals Value Taken Time  BP 127/92 05/18/20 2346  Temp    Pulse 84 05/18/20 2347  Resp 16 05/18/20 2347  SpO2 95 % 05/18/20 2347  Vitals shown include unvalidated device data.  Last Pain:  Vitals:   05/18/20 1902  TempSrc:   PainSc: 9          Complications: No complications documented.
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
## 2020-05-18 NOTE — Anesthesia Procedure Notes (Signed)
Procedure Name: Intubation Date/Time: 05/18/2020 9:29 PM Performed by: Claudina Lick, CRNA Pre-anesthesia Checklist: Patient identified, Emergency Drugs available, Suction available, Patient being monitored and Timeout performed Patient Re-evaluated:Patient Re-evaluated prior to induction Oxygen Delivery Method: Circle system utilized Preoxygenation: Pre-oxygenation with 100% oxygen Induction Type: IV induction, Rapid sequence and Cricoid Pressure applied Laryngoscope Size: Miller and 2 Grade View: Grade I Tube type: Oral Tube size: 7.0 mm Number of attempts: 1 Airway Equipment and Method: Stylet Placement Confirmation: ETT inserted through vocal cords under direct vision,  positive ETCO2 and breath sounds checked- equal and bilateral Secured at: 21 cm Tube secured with: Tape Dental Injury: Teeth and Oropharynx as per pre-operative assessment

## 2020-05-18 NOTE — Discharge Instructions (Signed)

## 2020-05-18 NOTE — ED Notes (Signed)
Pt wrist wrapped and bleeding controlled.
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
## 2020-05-19 ENCOUNTER — Ambulatory Visit: Payer: Medicaid Other | Admitting: Obstetrics and Gynecology

## 2020-05-19 ENCOUNTER — Encounter (HOSPITAL_COMMUNITY): Payer: Self-pay | Admitting: Orthopedic Surgery

## 2020-05-19 MED ORDER — FENTANYL CITRATE (PF) 100 MCG/2ML IJ SOLN
INTRAMUSCULAR | Status: AC
  Filled 2020-05-19: qty 2

## 2020-05-19 NOTE — Anesthesia Postprocedure Evaluation (Signed)
Anesthesia Post Note  Patient: Jill Reed  Procedure(s) Performed: RIGHT WRIST EXPLORATION WITH TENDON, ARTERY AND NERVE REPAIR (N/A Wrist)     Patient location during evaluation: PACU Anesthesia Type: General Level of consciousness: awake and alert Pain management: pain level controlled Vital Signs Assessment: post-procedure vital signs reviewed and stable Respiratory status: spontaneous breathing, nonlabored ventilation, respiratory function stable and patient connected to nasal cannula oxygen Cardiovascular status: blood pressure returned to baseline and stable Postop Assessment: no apparent nausea or vomiting Anesthetic complications: no   No complications documented.  Last Vitals:  Vitals:   05/19/20 0031 05/19/20 0046  BP: 116/78 110/79  Pulse: (!) 55 75  Resp: 11 14  Temp:  36.5 C  SpO2: 96% 95%    Last Pain:  Vitals:   05/19/20 0046  TempSrc:   PainSc: 6                  Cecile Hearing

## 2020-05-19 NOTE — Progress Notes (Signed)
Orthopedic Tech Progress Note Patient Details:  Jill Reed 09-15-1990 [id number]  Ortho Devices Type of Ortho Device: Arm sling Ortho Device/Splint Location: Right Upper Extremity Ortho Device/Splint Interventions: Ordered, Delivered to PACU       Gerald Stabs 05/19/2020, 12:37 AM

## 2020-05-28 ENCOUNTER — Other Ambulatory Visit: Payer: Self-pay

## 2020-05-28 ENCOUNTER — Ambulatory Visit (INDEPENDENT_AMBULATORY_CARE_PROVIDER_SITE_OTHER): Payer: Medicaid Other | Admitting: Obstetrics

## 2020-05-28 ENCOUNTER — Encounter: Payer: Self-pay | Admitting: Obstetrics

## 2020-05-28 DIAGNOSIS — Z3202 Encounter for pregnancy test, result negative: Secondary | ICD-10-CM | POA: Diagnosis not present

## 2020-05-28 DIAGNOSIS — Z3009 Encounter for other general counseling and advice on contraception: Secondary | ICD-10-CM

## 2020-05-28 DIAGNOSIS — Z30013 Encounter for initial prescription of injectable contraceptive: Secondary | ICD-10-CM

## 2020-05-28 DIAGNOSIS — O99893 Other specified diseases and conditions complicating puerperium: Secondary | ICD-10-CM

## 2020-05-28 DIAGNOSIS — N944 Primary dysmenorrhea: Secondary | ICD-10-CM

## 2020-05-28 LAB — POCT URINE PREGNANCY: Preg Test, Ur: NEGATIVE

## 2020-05-28 MED ORDER — MEDROXYPROGESTERONE ACETATE 150 MG/ML IM SUSP: 150.0000 mg | INTRAMUSCULAR | 4 refills | Status: DC

## 2020-05-28 MED ORDER — IBUPROFEN 800 MG PO TABS: 800.0000 mg | ORAL_TABLET | Freq: Three times a day (TID) | ORAL | 5 refills | Status: DC | PRN

## 2020-05-28 NOTE — Progress Notes (Signed)
  Social determinant risk not applicable to this patient.

## 2020-05-28 NOTE — Progress Notes (Signed)
Post Partum Visit Note  Jill Reed is a 29 y.o. G73P1102 female who presents for a postpartum visit. She is 8 weeks postpartum following a normal spontaneous vaginal delivery.  I have fully reviewed the prenatal and intrapartum course. The delivery was at  [redacted]w[redacted]d gestational weeks.  Anesthesia: epidural. Postpartum course has been uncomplicated. Baby is doing well. Baby is feeding by bottle - Gerber Gentle. Bleeding no bleeding. Bowel function is normal. Bladder function is normal. Patient is sexually active. Contraception method is none. Postpartum depression screening: negative.   The pregnancy intention screening data noted above was reviewed. Potential methods of contraception were discussed. The patient elected to proceed with depo.    Edinburgh Postnatal Depression Scale - 05/28/20 0921      Edinburgh Postnatal Depression Scale:  In the Past 7 Days   I have been able to laugh and see the funny side of things. 2    I have looked forward with enjoyment to things. 1    I have blamed myself unnecessarily when things went wrong. 1    I have been anxious or worried for no good reason. 0    I have felt scared or panicky for no good reason. 0    Things have been getting on top of me. 1    I have been so unhappy that I have had difficulty sleeping. 1    I have felt sad or miserable. 0    I have been so unhappy that I have been crying. 0    The thought of harming myself has occurred to me. 0    Edinburgh Postnatal Depression Scale Total 6          The following portions of the patient's history were reviewed and updated as appropriate: allergies, current medications, past family history, past medical history, past social history, past surgical history and problem list.  Review of Systems A comprehensive review of systems was negative.    Objective:  Blood pressure 114/71, pulse (!) 55, height 5\' 3"  (1.6 m), not currently breastfeeding.  General:  alert and no distress    Breasts:  inspection negative, no nipple discharge or bleeding, no masses or nodularity palpable  Lungs: clear to auscultation bilaterally  Heart:  regular rate and rhythm, S1, S2 normal, no murmur, click, rub or gallop  Abdomen: soft, non-tender; bowel sounds normal; no masses,  no organomegaly    Assessment:    1. Postpartum care following vaginal delivery  2. Encounter for other general counseling or advice on contraception - wants to start Depo Provera  3. Encounter for initial prescription of injectable contraceptive Rx: - medroxyPROGESTERone (DEPO-PROVERA) 150 MG/ML injection; Inject 1 mL (150 mg total) into the muscle every 3 (three) months.  Dispense: 1 mL; Refill: 4 - POCT urine pregnancy  4. Primary dysmenorrhea Rx: - ibuprofen (ADVIL) 800 MG tablet; Take 1 tablet (800 mg total) by mouth every 8 (eight) hours as needed.  Dispense: 30 tablet; Refill: 5   Plan:   Essential components of care per ACOG recommendations:  1.  Mood and well being: Patient with negative depression screening today.   - Patient does use tobacco. If using tobacco we discussed reduction and for recently cessation risk of relapse - hx of drug use? No    2. Infant care and feeding:  -Patient currently breastmilk feeding? No  -Social determinants of health (SDOH) reviewed in EPIC. No concerns  3. Sexuality, contraception and birth spacing - Patient does not  want a pregnancy in the next year.  Desired family size is 2 children.  - Reviewed forms of contraception in tiered fashion. Patient desired Depo-Provera today.   - Discussed birth spacing of 18 months  4. Sleep and fatigue -Encouraged family/partner/community support of 4 hrs of uninterrupted sleep to help with mood and fatigue  5. Physical Recovery  - Discussed patients delivery - Patient has urinary incontinence? No - Patient is safe to resume physical and sexual activity  6.  Health Maintenance - Last pap smear done 09-05-2019 and  was normal with negative HPV.  7. No Chronic Disease  Coral Ceo, MD [facility], [facility] 05/28/20

## 2020-06-15 ENCOUNTER — Ambulatory Visit (INDEPENDENT_AMBULATORY_CARE_PROVIDER_SITE_OTHER): Payer: Medicaid Other | Admitting: Obstetrics

## 2020-06-15 ENCOUNTER — Encounter: Payer: Self-pay | Admitting: Obstetrics

## 2020-06-15 ENCOUNTER — Other Ambulatory Visit: Payer: Self-pay

## 2020-06-15 VITALS — BP 121/68 | HR 60 | Wt 156.0 lb

## 2020-06-15 DIAGNOSIS — Z3042 Encounter for surveillance of injectable contraceptive: Secondary | ICD-10-CM | POA: Diagnosis not present

## 2020-06-15 LAB — POCT URINE PREGNANCY: Preg Test, Ur: NEGATIVE

## 2020-06-15 MED ORDER — MEDROXYPROGESTERONE ACETATE 150 MG/ML IM SUSP
150.0000 mg | Freq: Once | INTRAMUSCULAR | Status: AC
  Administered 2020-06-15: 150 mg via INTRAMUSCULAR

## 2020-06-15 NOTE — Progress Notes (Addendum)
GYN presents for 2nd UPT/DEPO restart, she had PP visit 05/28/2020, she was told to return for DEPO Injection in 2 weeks.  Last PAP 09/05/2019  UPT today is NEGATIVE  DEPO Injection given in [facility], tolerated well.  Next DEPO Feb. 15 - Mar. 1, 2022  Administrations This Visit    medroxyPROGESTERone (DEPO-PROVERA) injection 150 mg    Admin Date 06/15/2020 Action Given Dose 150 mg Route Intramuscular Administered By Maretta Bees, RMA          Patient was assessed and managed by nursing staff during this encounter. I have reviewed the chart and agree with the documentation and plan. I have also made any necessary editorial changes.  Coral Ceo, MD 06/15/2020 11:05 AM

## 2020-06-19 ENCOUNTER — Ambulatory Visit (HOSPITAL_COMMUNITY)
Admission: EM | Admit: 2020-06-19 | Discharge: 2020-06-19 | Disposition: A | Discharge status: Home / Self Care | Payer: Medicaid Other | Attending: Emergency Medicine | Admitting: Emergency Medicine

## 2020-06-19 ENCOUNTER — Other Ambulatory Visit: Payer: Self-pay

## 2020-06-19 DIAGNOSIS — R8290 Unspecified abnormal findings in urine: Secondary | ICD-10-CM | POA: Diagnosis present

## 2020-06-19 DIAGNOSIS — M79641 Pain in right hand: Secondary | ICD-10-CM | POA: Diagnosis not present

## 2020-06-19 DIAGNOSIS — Z3202 Encounter for pregnancy test, result negative: Secondary | ICD-10-CM | POA: Diagnosis not present

## 2020-06-19 DIAGNOSIS — Z113 Encounter for screening for infections with a predominantly sexual mode of transmission: Secondary | ICD-10-CM | POA: Diagnosis present

## 2020-06-19 LAB — POCT URINALYSIS DIPSTICK, ED / UC
Bilirubin Urine: NEGATIVE
Glucose, UA: NEGATIVE mg/dL
Hgb urine dipstick: NEGATIVE
Ketones, ur: NEGATIVE mg/dL
Leukocytes,Ua: NEGATIVE
Nitrite: NEGATIVE
Protein, ur: NEGATIVE mg/dL
Specific Gravity, Urine: 1.02 (ref 1.005–1.030)
Urobilinogen, UA: 0.2 mg/dL (ref 0.0–1.0)
pH: 6 (ref 5.0–8.0)

## 2020-06-19 LAB — POC URINE PREG, ED: Preg Test, Ur: NEGATIVE

## 2020-06-19 MED ORDER — IBUPROFEN 800 MG PO TABS: 800.0000 mg | ORAL_TABLET | Freq: Three times a day (TID) | ORAL | 0 refills | Status: DC | PRN

## 2020-06-19 NOTE — ED Notes (Signed)
Pt states she needs to use restroom and states she wants to get "checked for any infections", including STDs.  Provider notified.

## 2020-06-19 NOTE — ED Provider Notes (Signed)
[facility]    CSN: [id number] Arrival date & time: 06/19/20  1137      History   Chief Complaint Chief Complaint  Patient presents with  . Hand Pain    Rt    HPI Jill Reed is a 29 y.o. female.   Patient with pain in her right forearm and hand.  She had surgery on her right wrist on 05/18/2020 to repair tendons, nerves, muscles following an injury.  She denies new injury or trauma.  She states her fingers are numb and weak but have been since the original injury.  She denies fever, chills, redness, bruising, or other symptoms.  Patient also reports malodorous urine and vagina.  She would like to be checked for UTI and STDs.  She denies rash, lesions, vaginal discharge, pelvic pain, dysuria, back pain, abdominal pain, or other symptoms.   The history is provided by the patient and medical records.    Past Medical History:  Diagnosis Date  . Anxiety   . Asthma   . Atypical chest pain 09/23/2019  . Atypical chest pain 09/23/2019  . Depression   . Irregular heart beat   . Shortness of breath 09/23/2019  . Shortness of breath 09/23/2019  . Sickle cell trait (HCC)   . Tobacco abuse 09/23/2019    Patient Active Problem List   Diagnosis Date Noted  . Vaginal delivery 04/01/2020  . Indication for care in labor or delivery 03/31/2020  . [redacted] weeks gestation of pregnancy 03/26/2020  . Marijuana use 01/23/2020  . 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 History of pre-eclampsia in prior pregnancy, currently pregnant 09/25/2019  . Tobacco abuse 09/23/2019  . 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 History of preterm labor, current pregnancy, first trimester 09/05/2019  . Irregular heart beat   . Sickle cell trait (HCC)   . Asthma affecting pregnancy in third trimester   . Supervision of high risk pregnancy, antepartum 08/29/2019    Past Surgical History:  Procedure Laterality Date  . NO PAST SURGERIES    . WOUND EXPLORATION N/A 05/18/2020    Procedure: RIGHT WRIST EXPLORATION WITH TENDON, ARTERY AND NERVE REPAIR;  Surgeon: Betha Loa, MD;  Location: [facility];  Service: Orthopedics;  Laterality: N/A;    OB History    Gravida  2   Para  2   Term  1   Preterm  1   AB  0   Living  2     SAB  0   TAB  0   Ectopic  0   Multiple  0   Live Births  2            Home Medications    Prior to Admission medications   Medication Sig Start Date End Date Taking? Authorizing Provider  HYDROcodone-acetaminophen (NORCO) 5-325 MG tablet 1-2 tabs po q6 hours prn pain 05/18/20   Betha Loa, MD  ibuprofen (ADVIL) 800 MG tablet Take 1 tablet (800 mg total) by mouth every 8 (eight) hours as needed. 06/19/20   Mickie Bail, NP  medroxyPROGESTERone (DEPO-PROVERA) 150 MG/ML injection Inject 1 mL (150 mg total) into the muscle every 3 (three) months. 05/28/20   Brock Bad, MD    Family History Family History  Problem Relation Age of Onset  . Sickle cell anemia Father   . Breast cancer Maternal Aunt        pt doesn't know age of diagnosis  . Hypertension Mother   .  Arthritis Mother   . ADD / ADHD Paternal Uncle   . Arthritis Maternal Grandmother   . Hypertension Maternal Grandmother   . Diabetes Paternal Grandmother   . Stroke Paternal Grandmother     Social History Social History   Tobacco Use  . Smoking status: Current Some Day Smoker    Types: Cigars  . Smokeless tobacco: Never Used  . Tobacco comment: black n milds-last two weeks ago  Vaping Use  . Vaping Use: Never used  Substance Use Topics  . Alcohol use: No  . Drug use: No     Allergies   Carrot [daucus carota], Mushroom extract complex, and Peach [prunus persica]   Review of Systems Review of Systems  Constitutional: Negative for chills and fever.  HENT: Negative for ear pain and sore throat.   Eyes: Negative for pain and visual disturbance.  Respiratory: Negative for cough and shortness of breath.   Cardiovascular: Negative for chest  pain and palpitations.  Gastrointestinal: Negative for abdominal pain and vomiting.  Genitourinary: Negative for dysuria, flank pain, hematuria, pelvic pain and vaginal discharge.  Musculoskeletal: Positive for arthralgias. Negative for back pain.  Skin: Negative for color change and rash.  Neurological: Positive for weakness and numbness. Negative for seizures and syncope.  All other systems reviewed and are negative.    Physical Exam Triage Vital Signs ED Triage Vitals  Enc Vitals Group     BP 06/19/20 1230 114/71     Pulse Rate 06/19/20 1230 92     Resp 06/19/20 1230 18     Temp 06/19/20 1230 98.4 F (36.9 C)     Temp Source 06/19/20 1230 Oral     SpO2 06/19/20 1230 100 %     Weight 06/19/20 1233 156 lb (70.8 kg)     Height 06/19/20 1233 5\' 2"  (1.575 m)     Head Circumference --      Peak Flow --      Pain Score 06/19/20 1232 8     Pain Loc --      Pain Edu? --      Excl. in GC? --    No data found.  Updated Vital Signs BP 114/71 (BP Location: Right Arm)   Pulse 92   Temp 98.4 F (36.9 C) (Oral)   Resp 18   Ht 5\' 2"  (1.575 m)   Wt 156 lb (70.8 kg)   LMP 05/26/2020   SpO2 100%   BMI 28.53 kg/m   Visual Acuity Right Eye Distance:   Left Eye Distance:   Bilateral Distance:    Right Eye Near:   Left Eye Near:    Bilateral Near:     Physical Exam Vitals and nursing note reviewed.  Constitutional:      General: She is not in acute distress.    Appearance: She is well-developed.  HENT:     Head: Normocephalic and atraumatic.     Mouth/Throat:     Mouth: Mucous membranes are moist.     Pharynx: Oropharynx is clear.  Eyes:     Conjunctiva/sclera: Conjunctivae normal.  Cardiovascular:     Rate and Rhythm: Normal rate and regular rhythm.     Heart sounds: Normal heart sounds.  Pulmonary:     Effort: Pulmonary effort is normal. No respiratory distress.     Breath sounds: Normal breath sounds.  Abdominal:     Palpations: Abdomen is soft.     Tenderness:  There is no abdominal tenderness. There is no right  CVA tenderness, left CVA tenderness, guarding or rebound.  Musculoskeletal:        General: No swelling.     Cervical back: Neck supple.  Skin:    General: Skin is warm and dry.     Capillary Refill: Capillary refill takes less than 2 seconds.     Findings: No bruising, erythema, lesion or rash.  Neurological:     Mental Status: She is alert and oriented to person, place, and time. Mental status is at baseline.     Sensory: Sensory deficit present.     Motor: Weakness present.     Gait: Gait normal.     Comments: Right hand/fingers have decreased strength and sensation.  Surgical incision is healed; no erythema, drainage, ecchymosis.  Psychiatric:        Mood and Affect: Mood normal.        Behavior: Behavior normal.      UC Treatments / Results  Labs (all labs ordered are listed, but only abnormal results are displayed) Labs Reviewed  POC URINE PREG, ED  POCT URINALYSIS DIPSTICK, ED / UC  CERVICOVAGINAL ANCILLARY ONLY    EKG   Radiology No results found.  Procedures Procedures (including critical care time)  Medications Ordered in UC Medications - No data to display  Initial Impression / Assessment and Plan / UC Course  I have reviewed the triage vital signs and the nursing notes.  Pertinent labs & imaging results that were available during my care of the patient were reviewed by me and considered in my medical decision making (see chart for details).   Right hand pain.  Malodorous urine.  Screening for STDs.  Urine normal.  Vaginal swab obtained by patient for testing.  Instructed her to abstain from sexual activity until the test results are back.  Discussed that she and her sexual partner may require treatment at that time.  Treating hand pain with ibuprofen.  Instructed her to call Dr. Merrilee Seashore office on Monday to schedule the soonest available appointment to address her pain.  Patient agrees to plan of  care.   Final Clinical Impressions(s) / UC Diagnoses   Final diagnoses:  Right hand pain  Malodorous urine  Screen for STD (sexually transmitted disease)     Discharge Instructions     Take the prescribed ibuprofen as needed for discomfort.  Follow-up with Dr. Merlyn Lot on Monday.  Your urine does not show signs of infection.    Your vaginal tests are pending.  If your test results are positive, we will call you.  You and your sexual partner(s) may require treatment at that time.  Do not have sexual activity until the test results are back.     ED Prescriptions    Medication Sig Dispense Auth. Provider   ibuprofen (ADVIL) 800 MG tablet Take 1 tablet (800 mg total) by mouth every 8 (eight) hours as needed. 21 tablet Mickie Bail, NP     PDMP not reviewed this encounter.   Mickie Bail, NP 06/19/20 [phone]

## 2020-06-19 NOTE — Discharge Instructions (Signed)
Take the prescribed ibuprofen as needed for discomfort.  Follow-up with Dr. Merlyn Lot on Monday.  Your urine does not show signs of infection.    Your vaginal tests are pending.  If your test results are positive, we will call you.  You and your sexual partner(s) may require treatment at that time.  Do not have sexual activity until the test results are back.

## 2020-06-19 NOTE — ED Triage Notes (Signed)
PT reports having surgery in NOV. Tuesday Pt was at therapy for  post -op care . Pt now reports pain to surgical site on anterior Rt wrist . Pain is a 8/10. Pt had nerver surgery to Rt hand and all fingers a re numb and Pt can not move fingers.

## 2020-06-21 ENCOUNTER — Telehealth (HOSPITAL_COMMUNITY): Payer: Self-pay | Admitting: Emergency Medicine

## 2020-06-21 LAB — CERVICOVAGINAL ANCILLARY ONLY
Bacterial Vaginitis (gardnerella): POSITIVE — AB
Candida Glabrata: NEGATIVE
Candida Vaginitis: NEGATIVE
Chlamydia: NEGATIVE
Comment: NEGATIVE
Comment: NEGATIVE
Comment: NEGATIVE
Comment: NEGATIVE
Comment: NEGATIVE
Comment: NORMAL
Neisseria Gonorrhea: NEGATIVE
Trichomonas: NEGATIVE

## 2020-06-21 MED ORDER — METRONIDAZOLE 500 MG PO TABS: 500.0000 mg | ORAL_TABLET | Freq: Two times a day (BID) | ORAL | 0 refills | Status: DC

## 2020-06-29 ENCOUNTER — Ambulatory Visit: Payer: Medicaid Other | Admitting: Obstetrics and Gynecology

## 2020-06-29 ENCOUNTER — Other Ambulatory Visit: Payer: Self-pay

## 2020-07-16 ENCOUNTER — Emergency Department (HOSPITAL_COMMUNITY)
Admission: EM | Admit: 2020-07-16 | Discharge: 2020-07-16 | Disposition: A | Discharge status: Home / Self Care | Payer: Medicaid Other | Attending: Emergency Medicine | Admitting: Emergency Medicine

## 2020-07-16 ENCOUNTER — Other Ambulatory Visit: Payer: Self-pay

## 2020-07-16 DIAGNOSIS — Z5321 Procedure and treatment not carried out due to patient leaving prior to being seen by health care provider: Secondary | ICD-10-CM | POA: Diagnosis not present

## 2020-07-16 DIAGNOSIS — U071 COVID-19: Secondary | ICD-10-CM | POA: Insufficient documentation

## 2020-07-16 DIAGNOSIS — R509 Fever, unspecified: Secondary | ICD-10-CM | POA: Diagnosis present

## 2020-07-16 LAB — COMPREHENSIVE METABOLIC PANEL
ALT: 23 U/L (ref 0–44)
AST: 19 U/L (ref 15–41)
Albumin: 4.4 g/dL (ref 3.5–5.0)
Alkaline Phosphatase: 77 U/L (ref 38–126)
Anion gap: 11 (ref 5–15)
BUN: <5 mg/dL — ABNORMAL LOW (ref 6–20)
CO2: 22 mmol/L (ref 22–32)
Calcium: 9.6 mg/dL (ref 8.9–10.3)
Chloride: 102 mmol/L (ref 98–111)
Creatinine, Ser: 0.88 mg/dL (ref 0.44–1.00)
GFR, Estimated: >60 mL/min (ref >60)
Glucose, Bld: 109 mg/dL — ABNORMAL HIGH (ref 70–99)
Potassium: 3.7 mmol/L (ref 3.5–5.1)
Sodium: 135 mmol/L (ref 135–145)
Total Bilirubin: 0.8 mg/dL (ref 0.3–1.2)
Total Protein: 7.5 g/dL (ref 6.5–8.1)

## 2020-07-16 LAB — URINALYSIS, ROUTINE W REFLEX MICROSCOPIC
Bacteria, UA: NONE SEEN
Bilirubin Urine: NEGATIVE
Glucose, UA: NEGATIVE mg/dL
Ketones, ur: NEGATIVE mg/dL
Leukocytes,Ua: NEGATIVE
Nitrite: NEGATIVE
Protein, ur: NEGATIVE mg/dL
Specific Gravity, Urine: 1.006 (ref 1.005–1.030)
pH: 7 (ref 5.0–8.0)

## 2020-07-16 LAB — CBC
HCT: 39.5 % (ref 36.0–46.0)
Hemoglobin: 13.6 g/dL (ref 12.0–15.0)
MCH: 31.2 pg (ref 26.0–34.0)
MCHC: 34.4 g/dL (ref 30.0–36.0)
MCV: 90.6 fL (ref 80.0–100.0)
Platelets: 181 10*3/uL (ref 150–400)
RBC: 4.36 MIL/uL (ref 3.87–5.11)
RDW: 13.6 % (ref 11.5–15.5)
WBC: 8 10*3/uL (ref 4.0–10.5)
nRBC: 0 % (ref 0.0–0.2)

## 2020-07-16 LAB — RESP PANEL BY RT-PCR (FLU A&B, COVID) ARPGX2
Influenza A by PCR: NEGATIVE
Influenza B by PCR: NEGATIVE
SARS Coronavirus 2 by RT PCR: POSITIVE — AB

## 2020-07-16 LAB — LIPASE, BLOOD: Lipase: 23 U/L (ref 11–51)

## 2020-07-16 LAB — POC URINE PREG, ED: Preg Test, Ur: NEGATIVE

## 2020-07-16 MED ORDER — ACETAMINOPHEN 500 MG PO TABS
1000.0000 mg | ORAL_TABLET | Freq: Once | ORAL | Status: AC
  Administered 2020-07-16: 1000 mg via ORAL
  Filled 2020-07-16: qty 2

## 2020-07-16 NOTE — ED Notes (Signed)
Pt LWBS 

## 2020-07-16 NOTE — ED Notes (Signed)
Pt inquired about the wait. After hearing the time and that we are no able to provide results in the lobby. Pt has decided to leave. Pt encouraged to stay, but told to come back if anything changes.

## 2020-07-16 NOTE — ED Triage Notes (Signed)
Pt reports fever, body aches, abdominal pain, n/v/d since yesterday. Denies sick contacts, has had covid in the last year.

## 2020-09-06 ENCOUNTER — Ambulatory Visit: Payer: Medicaid Other

## 2020-09-14 ENCOUNTER — Other Ambulatory Visit: Payer: Self-pay

## 2020-09-14 ENCOUNTER — Ambulatory Visit (HOSPITAL_COMMUNITY)
Admission: EM | Admit: 2020-09-14 | Discharge: 2020-09-14 | Disposition: A | Discharge status: Home / Self Care | Payer: Medicaid Other | Attending: Emergency Medicine | Admitting: Emergency Medicine

## 2020-09-14 ENCOUNTER — Encounter (HOSPITAL_COMMUNITY): Payer: Self-pay

## 2020-09-14 DIAGNOSIS — K130 Diseases of lips: Secondary | ICD-10-CM

## 2020-09-14 MED ORDER — DOXYCYCLINE HYCLATE 100 MG PO CAPS: 100.0000 mg | ORAL_CAPSULE | Freq: Two times a day (BID) | ORAL | 0 refills | Status: AC

## 2020-09-14 MED ORDER — IBUPROFEN 600 MG PO TABS: 600.0000 mg | ORAL_TABLET | Freq: Four times a day (QID) | ORAL | 0 refills | Status: DC | PRN

## 2020-09-14 NOTE — ED Provider Notes (Signed)
HPI  SUBJECTIVE:  Jill Reed is a 30 y.o. female who presents with painful left lower lip swelling starting last night.  She states that she had a bite/bump under her lower lip starting 4 days ago.  She states that she squeezed out pus and blood.  She reports painful swelling underneath her chin starting this morning.  No swelling under her tongue, sore throat, difficulty breathing, trismus, drooling, fevers, sensation of throat swelling shot,.  No antipyretic in the past 6 hours.  She has tried alcohol, warm and cool compresses and peroxide without improvement in her symptoms.  Symptoms are worse with squeezing.  She has a past medical history of asthma.  No history of diabetes, MRSA, hypertension.  LMP: November 21.  She denies the possibility of being pregnant-is on Depo.  PMD: [facility]  Past Medical History:  Diagnosis Date  . Anxiety   . Asthma   . Atypical chest pain 09/23/2019  . Atypical chest pain 09/23/2019  . Depression   . Irregular heart beat   . Shortness of breath 09/23/2019  . Shortness of breath 09/23/2019  . Sickle cell trait (HCC)   . Tobacco abuse 09/23/2019    Past Surgical History:  Procedure Laterality Date  . NO PAST SURGERIES    . WOUND EXPLORATION N/A 05/18/2020   Procedure: RIGHT WRIST EXPLORATION WITH TENDON, ARTERY AND NERVE REPAIR;  Surgeon: Betha Loa, MD;  Location: [facility];  Service: Orthopedics;  Laterality: N/A;    Family History  Problem Relation Age of Onset  . Sickle cell anemia Father   . Breast cancer Maternal Aunt        pt doesn't know age of diagnosis  . Hypertension Mother   . Arthritis Mother   . ADD / ADHD Paternal Uncle   . Arthritis Maternal Grandmother   . Hypertension Maternal Grandmother   . Diabetes Paternal Grandmother   . Stroke Paternal Grandmother     Social History   Tobacco Use  . Smoking status: Current Some Day Smoker    Types: Cigars  . Smokeless tobacco: Never Used  . Tobacco comment: black n milds-last two  weeks ago  Vaping Use  . Vaping Use: Never used  Substance Use Topics  . Alcohol use: No  . Drug use: No    No current facility-administered medications for this encounter.  Current Outpatient Medications:  .  doxycycline (VIBRAMYCIN) 100 MG capsule, Take 1 capsule (100 mg total) by mouth 2 (two) times daily for 5 days., Disp: 10 capsule, Rfl: 0 .  ibuprofen (ADVIL) 600 MG tablet, Take 1 tablet (600 mg total) by mouth every 6 (six) hours as needed., Disp: 30 tablet, Rfl: 0 .  medroxyPROGESTERone (DEPO-PROVERA) 150 MG/ML injection, Inject 1 mL (150 mg total) into the muscle every 3 (three) months., Disp: 1 mL, Rfl: 4  Allergies  Allergen Reactions  . Carrot [Daucus Carota] Itching and Rash  . Mushroom Extract Complex Itching and Rash  . Peach [Prunus Persica] Itching and Rash     ROS  As noted in HPI.   Physical Exam  BP 130/77 (BP Location: Right Arm)   Pulse 87   Temp 97.7 F (36.5 C) (Oral)   Resp 18   SpO2 100%   Constitutional: Well developed, well nourished, no acute distress Eyes:  EOMI, conjunctiva normal bilaterally HENT: Normocephalic, atraumatic,mucus membranes moist Tender lower lip edema small amount of induration.  No upper lip, tongue angioedema.  Positive tender pimple underneath lip with no  expressible purulent drainage.  No swelling underneath the tongue.  No drooling, trismus.      Lymph: Positive tender submental lymphadenopathy Respiratory: Normal inspiratory effort Cardiovascular: Normal rate GI: nondistended skin: No rash, skin intact Musculoskeletal: no deformities Neurologic: Alert & oriented x 3, no focal neuro deficits Psychiatric: Speech and behavior appropriate   ED Course   Medications - No data to display  No orders of the defined types were placed in this encounter.   No results found for this or any previous visit (from the past 24 hour(s)). No results found.  ED Clinical Impression  1. Cellulitis, lip      ED  Assessment/Plan  Cellulitis of the lower lip.  May have been a small abscess.  There does not appear to be anything I&D today.  There is no evidence of Ludwicg's angina.  Will send home with warm compresses, Tylenol/ibuprofen, doxycycline for 5 days.  Return here in 3 days if not getting any better we can consider I&D at the time  Discussed MDM, treatment plan, and plan for follow-up with patient. patient agrees with plan.   Meds ordered this encounter  Medications  . ibuprofen (ADVIL) 600 MG tablet    Sig: Take 1 tablet (600 mg total) by mouth every 6 (six) hours as needed.    Dispense:  30 tablet    Refill:  0  . doxycycline (VIBRAMYCIN) 100 MG capsule    Sig: Take 1 capsule (100 mg total) by mouth 2 (two) times daily for 5 days.    Dispense:  10 capsule    Refill:  0    *This clinic note was created using Scientist, clinical (histocompatibility and immunogenetics). Therefore, there may be occasional mistakes despite careful proofreading.   ?    Domenick Gong, MD 09/14/20 [phone]

## 2020-09-14 NOTE — ED Triage Notes (Signed)
Pt presents with swelling, dryness & itching of bottom lip X 2 days from unknown source.

## 2020-09-14 NOTE — Discharge Instructions (Addendum)
Warm compresses, ibuprofen combined with 1000 mg of Tylenol together 3-4 times a day.  Finish the antibiotics, even if you feel better.  Return here if your symptoms get worse and we can consider incision and drainage.

## 2021-10-11 ENCOUNTER — Encounter (HOSPITAL_COMMUNITY): Payer: Self-pay | Admitting: Emergency Medicine

## 2021-10-11 ENCOUNTER — Ambulatory Visit (HOSPITAL_COMMUNITY)
Admission: EM | Admit: 2021-10-11 | Discharge: 2021-10-11 | Disposition: A | Discharge status: Home / Self Care | Payer: Medicaid Other | Attending: Family Medicine | Admitting: Family Medicine

## 2021-10-11 DIAGNOSIS — J029 Acute pharyngitis, unspecified: Secondary | ICD-10-CM | POA: Diagnosis not present

## 2021-10-11 LAB — POCT RAPID STREP A, ED / UC: Streptococcus, Group A Screen (Direct): NEGATIVE

## 2021-10-11 NOTE — ED Triage Notes (Signed)
Pt reports outside this past weekend. Congestion and throat started bothering her. Reports throat still sore and irritated. Reports dry cough.  ?

## 2021-10-11 NOTE — ED Provider Notes (Signed)
?[facility] ? ? ? ?CSN: [id number] ?Arrival date & time: 10/11/21  4098 ? ? ?  ? ?History   ?Chief Complaint ?Chief Complaint  ?Patient presents with  ? Sore Throat  ? ? ?HPI ?LURLIE WIGEN is a 31 y.o. female.  ? ?Patient is here for sore throat x 3 days. She felt like it was going to close up on her.  Was very red, no exudate noted.  ?She used otc meds yesterday which did help, but then worse again last night. Very painful to swallow.  ?Also with runny nose, congestion.  No fever.  ? ?Past Medical History:  ?Diagnosis Date  ? Anxiety   ? Asthma   ? Atypical chest pain 09/23/2019  ? Atypical chest pain 09/23/2019  ? Depression   ? Irregular heart beat   ? Shortness of breath 09/23/2019  ? Shortness of breath 09/23/2019  ? Sickle cell trait (HCC)   ? Tobacco abuse 09/23/2019  ? ? ?Patient Active Problem List  ? Diagnosis Date Noted  ? Vaginal delivery 04/01/2020  ? Indication for care in labor or delivery 03/31/2020  ? [redacted] weeks gestation of pregnancy 03/26/2020  ? Marijuana use 01/23/2020  ? 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 History of pre-eclampsia in prior pregnancy, currently pregnant 09/25/2019  ? Tobacco abuse 09/23/2019  ? 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 History of preterm labor, current pregnancy, first trimester 09/05/2019  ? Irregular heart beat   ? Sickle cell trait (HCC)   ? Asthma affecting pregnancy in third trimester   ? Supervision of high risk pregnancy, antepartum 08/29/2019  ? ? ?Past Surgical History:  ?Procedure Laterality Date  ? NO PAST SURGERIES    ? WOUND EXPLORATION N/A 05/18/2020  ? Procedure: RIGHT WRIST EXPLORATION WITH TENDON, ARTERY AND NERVE REPAIR;  Surgeon: Betha Loa, MD;  Location: [facility];  Service: Orthopedics;  Laterality: N/A;  ? ? ?OB History   ? ? Gravida  ?2  ? Para  ?2  ? Term  ?1  ? Preterm  ?1  ? AB  ?0  ? Living  ?2  ?  ? ? SAB  ?0  ? IAB  ?0  ? Ectopic  ?0  ? Multiple  ?0  ? Live Births  ?2  ?   ?  ?  ? ? ? ?Home Medications    ? ?Prior to Admission medications   ?Medication Sig Start Date End Date Taking? Authorizing Provider  ?ibuprofen (ADVIL) 600 MG tablet Take 1 tablet (600 mg total) by mouth every 6 (six) hours as needed. 09/14/20   Domenick Gong, MD  ?medroxyPROGESTERone (DEPO-PROVERA) 150 MG/ML injection Inject 1 mL (150 mg total) into the muscle every 3 (three) months. 05/28/20   Brock Bad, MD  ? ? ?Family History ?Family History  ?Problem Relation Age of Onset  ? Sickle cell anemia Father   ? Breast cancer Maternal Aunt   ?     pt doesn't know age of diagnosis  ? Hypertension Mother   ? Arthritis Mother   ? ADD / ADHD Paternal Uncle   ? Arthritis Maternal Grandmother   ? Hypertension Maternal Grandmother   ? Diabetes Paternal Grandmother   ? Stroke Paternal Grandmother   ? ? ?Social History ?Social History  ? ?Tobacco Use  ? Smoking status: Some Days  ?  Types: Cigars  ? Smokeless tobacco: Never  ? Tobacco comments:  ?  black n milds-last two weeks  ago  ?Vaping Use  ? Vaping Use: Never used  ?Substance Use Topics  ? Alcohol use: No  ? Drug use: No  ? ? ? ?Allergies   ?Carrot [daucus carota], Mushroom extract complex, and Peach [prunus persica] ? ? ?Review of Systems ?Review of Systems  ?Constitutional:  Negative for fatigue and fever.  ?HENT:  Positive for congestion, rhinorrhea and sore throat.   ?Respiratory: Negative.    ?Cardiovascular: Negative.   ?Gastrointestinal: Negative.   ? ? ?Physical Exam ?Triage Vital Signs ?ED Triage Vitals  ?Enc Vitals Group  ?   BP 10/11/21 0928 117/75  ?   Pulse Rate 10/11/21 0928 (!) 57  ?   Resp 10/11/21 0928 17  ?   Temp 10/11/21 0928 98.3 ?F (36.8 ?C)  ?   Temp Source 10/11/21 0928 Oral  ?   SpO2 10/11/21 0928 99 %  ?   Weight --   ?   Height --   ?   Head Circumference --   ?   Peak Flow --   ?   Pain Score 10/11/21 0926 7  ?   Pain Loc --   ?   Pain Edu? --   ?   Excl. in GC? --   ? ?No data found. ? ?Updated Vital Signs ?BP 117/75 (BP Location: Right Arm)   Pulse (!) 57    Temp 98.3 ?F (36.8 ?C) (Oral)   Resp 17   LMP 09/23/2021   SpO2 99%  ? ?Visual Acuity ?Right Eye Distance:   ?Left Eye Distance:   ?Bilateral Distance:   ? ?Right Eye Near:   ?Left Eye Near:    ?Bilateral Near:    ? ?Physical Exam ?Constitutional:   ?   Appearance: She is well-developed.  ?HENT:  ?   Head: Normocephalic and atraumatic.  ?   Right Ear: Tympanic membrane normal.  ?   Left Ear: Tympanic membrane normal.  ?   Nose: Congestion and rhinorrhea present.  ?   Mouth/Throat:  ?   Mouth: Mucous membranes are moist. No oral lesions.  ?   Pharynx: Posterior oropharyngeal erythema present. No pharyngeal swelling or oropharyngeal exudate.  ?Cardiovascular:  ?   Rate and Rhythm: Normal rate and regular rhythm.  ?   Heart sounds: Normal heart sounds.  ?Pulmonary:  ?   Effort: Pulmonary effort is normal.  ?   Breath sounds: Normal breath sounds.  ?Musculoskeletal:  ?   Cervical back: Normal range of motion and neck supple.  ?Lymphadenopathy:  ?   Cervical: Cervical adenopathy present.  ?Neurological:  ?   Mental Status: She is alert.  ? ? ? ?UC Treatments / Results  ?Labs ?(all labs ordered are listed, but only abnormal results are displayed) ?Labs Reviewed  ?CULTURE, GROUP A STREP [facility])  ?POCT RAPID STREP A, ED / UC  ? ? ?EKG ? ? ?Radiology ?No results found. ? ?Procedures ?Procedures (including critical care time) ? ?Medications Ordered in UC ?Medications - No data to display ? ?Initial Impression / Assessment and Plan / UC Course  ?I have reviewed the triage vital signs and the nursing notes. ? ?Pertinent labs & imaging results that were available during my care of the patient were reviewed by me and considered in my medical decision making (see chart for details). ? ?  ? ?Final Clinical Impressions(s) / UC Diagnoses  ? ?Final diagnoses:  ?Viral pharyngitis  ? ? ? ?Discharge Instructions   ? ?  ?You were seen  today for sore throat.  ?Your rapid strep test was negative for bacterial infection.  However, we will  send this to the lab for further testing.  If this comes back positive for bacteria we will treat you with an antibiotic.  ?Your sore throat is likely due to a virus vs allergies.  This can still be very painful. There is no evidence of swelling.  You should continue to take tylenol or motrin for pain, as well as salt water gargles.  I recommend zyrtec or claritin to help with allergies symptoms as well.   ? ? ? ? ?ED Prescriptions   ?None ?  ? ?PDMP not reviewed this encounter. ?  ?Jannifer FranklinPiontek, Blayden Conwell, MD ?10/11/21 1009 ? ?

## 2021-10-11 NOTE — Discharge Instructions (Addendum)
You were seen today for sore throat.  ?Your rapid strep test was negative for bacterial infection.  However, we will send this to the lab for further testing.  If this comes back positive for bacteria we will treat you with an antibiotic.  ?Your sore throat is likely due to a virus vs allergies.  This can still be very painful. There is no evidence of swelling.  You should continue to take tylenol or motrin for pain, as well as salt water gargles.  I recommend zyrtec or claritin to help with allergies symptoms as well.   ? ?

## 2021-10-13 LAB — CULTURE, GROUP A STREP (THRC)

## 2021-10-14 ENCOUNTER — Emergency Department (HOSPITAL_COMMUNITY)
Admission: EM | Admit: 2021-10-14 | Discharge: 2021-10-14 | Disposition: A | Discharge status: Home / Self Care | Payer: Medicaid Other | Attending: Emergency Medicine | Admitting: Emergency Medicine

## 2021-10-14 ENCOUNTER — Encounter (HOSPITAL_COMMUNITY): Payer: Self-pay | Admitting: Emergency Medicine

## 2021-10-14 ENCOUNTER — Other Ambulatory Visit: Payer: Self-pay

## 2021-10-14 DIAGNOSIS — J028 Acute pharyngitis due to other specified organisms: Secondary | ICD-10-CM | POA: Diagnosis not present

## 2021-10-14 DIAGNOSIS — Z20822 Contact with and (suspected) exposure to covid-19: Secondary | ICD-10-CM | POA: Insufficient documentation

## 2021-10-14 DIAGNOSIS — J45909 Unspecified asthma, uncomplicated: Secondary | ICD-10-CM | POA: Insufficient documentation

## 2021-10-14 DIAGNOSIS — B9789 Other viral agents as the cause of diseases classified elsewhere: Secondary | ICD-10-CM | POA: Diagnosis not present

## 2021-10-14 DIAGNOSIS — J029 Acute pharyngitis, unspecified: Secondary | ICD-10-CM

## 2021-10-14 LAB — GROUP A STREP BY PCR: Group A Strep by PCR: NOT DETECTED

## 2021-10-14 LAB — RESP PANEL BY RT-PCR (FLU A&B, COVID) ARPGX2
Influenza A by PCR: NEGATIVE
Influenza B by PCR: NEGATIVE
SARS Coronavirus 2 by RT PCR: NEGATIVE

## 2021-10-14 LAB — MONONUCLEOSIS SCREEN: Mono Screen: NEGATIVE

## 2021-10-14 MED ORDER — IBUPROFEN 400 MG PO TABS
600.0000 mg | ORAL_TABLET | Freq: Once | ORAL | Status: AC
  Administered 2021-10-14: 600 mg via ORAL
  Filled 2021-10-14: qty 1

## 2021-10-14 MED ORDER — DEXAMETHASONE SODIUM PHOSPHATE 10 MG/ML IJ SOLN
10.0000 mg | Freq: Once | INTRAMUSCULAR | Status: AC
  Administered 2021-10-14: 10 mg via INTRAMUSCULAR
  Filled 2021-10-14: qty 1

## 2021-10-14 NOTE — ED Notes (Signed)
DC instructions reviewed with pt. PT verbalized understanding. Pt DC °

## 2021-10-14 NOTE — ED Provider Triage Note (Signed)
Emergency Medicine Provider Triage Evaluation Note ? ?Jill Reed , a 31 y.o. female  was evaluated in triage.  Pt complains of sore throat since Sunday.  Patient reports being seen at urgent care on Tuesday, having strep test conducted which was negative.  Patient states that she has progressively worsened since this time and is now spearing seeing trouble swallowing, swollen tonsils, sore throat, cough. ? ?Review of Systems  ?Positive: Sore throat, trouble swallowing, cough ?Negative: Fevers, nausea, vomiting, diarrhea, abdominal pain ? ?Physical Exam  ?BP 115/81 (BP Location: Right Arm)   Pulse 82   Temp 98.9 ?F (37.2 ?C) (Oral)   Resp 18   Ht 5\' 2"  (1.575 m)   Wt 69.9 kg   LMP 09/23/2021   SpO2 100%   BMI 28.19 kg/m?  ?Gen:   Awake, no distress   ?Resp:  Normal effort  ?MSK:   Moves extremities without difficulty  ?Other:  Bilateral tonsils swollen 1+, no exudate ? ?Medical Decision Making  ?Medically screening exam initiated at 8:27 PM.  Appropriate orders placed.  LARONNA REDINGER was informed that the remainder of the evaluation will be completed by another provider, this initial triage assessment does not replace that evaluation, and the importance of remaining in the ED until their evaluation is complete. ? ? ?  ?Azucena Cecil, PA-C ?10/14/21 2028 ? ?

## 2021-10-14 NOTE — ED Provider Notes (Signed)
? ?Emergency Department Provider Note ? ? ?I have reviewed the triage vital signs and the nursing notes. ? ? ?HISTORY ? ?Chief Complaint ?Sore Throat ? ? ?HPI ?Jill Reed is a 31 y.o. female presents to the ED with continued sore throat. Patient with several days pf painful swallowing. No difficulty swallowing or SOB. Notes it is painful to swallow. No fever. Went to UC with negative strep test at that time. No abdominal pain. No URI symptoms.   ? ? ?Past Medical History:  ?Diagnosis Date  ? Anxiety   ? Asthma   ? Atypical chest pain 09/23/2019  ? Atypical chest pain 09/23/2019  ? Depression   ? Irregular heart beat   ? Shortness of breath 09/23/2019  ? Shortness of breath 09/23/2019  ? Sickle cell trait (HCC)   ? Tobacco abuse 09/23/2019  ? ? ?Review of Systems ? ?Constitutional: No fever/chills ?ENT: Positive sore throat. ?Cardiovascular: Denies chest pain. ?Respiratory: Denies shortness of breath. ?Gastrointestinal: No abdominal pain.  No nausea, no vomiting.  No diarrhea.  No constipation. ?Genitourinary: Negative for dysuria. ?Musculoskeletal: Negative for back pain. ?Skin: Negative for rash. ?Neurological: Negative for headaches. ? ?____________________________________________ ? ? ?PHYSICAL EXAM: ? ?VITAL SIGNS: ?ED Triage Vitals  ?Enc Vitals Group  ?   BP 10/14/21 2019 115/81  ?   Pulse Rate 10/14/21 2019 82  ?   Resp 10/14/21 2019 18  ?   Temp 10/14/21 2019 98.9 ?F (37.2 ?C)  ?   Temp Source 10/14/21 2019 Oral  ?   SpO2 10/14/21 2019 100 %  ?   Weight 10/14/21 2022 154 lb 1.6 oz (69.9 kg)  ?   Height 10/14/21 2022 5\' 2"  (1.575 m)  ? ?Constitutional: Alert and oriented. Well appearing and in no acute distress. ?Eyes: Conjunctivae are normal.  ?Head: Atraumatic. ?Nose: No congestion/rhinnorhea. ?Mouth/Throat: Mucous membranes are moist.  Oropharynx with mild erythematous. No PTA.  ?Neck: No stridor.   ?Cardiovascular: Normal rate, regular rhythm. Good peripheral circulation. Grossly normal heart sounds.    ?Respiratory: Normal respiratory effort.  No retractions. Lungs CTAB. ?Gastrointestinal: Soft and nontender. No distention.  ?Musculoskeletal: No gross deformities of extremities. ?Neurologic:  Normal speech and language.  ?Skin:  Skin is warm, dry and intact. No rash noted. ? ?____________________________________________ ?  ?LABS ?(all labs ordered are listed, but only abnormal results are displayed) ? ?Labs Reviewed  ?RESP PANEL BY RT-PCR (FLU A&B, COVID) ARPGX2  ?GROUP A STREP BY PCR  ?MONONUCLEOSIS SCREEN  ? ? ?____________________________________________ ? ? ?PROCEDURES ? ?Procedure(s) performed:  ? ?Procedures ? ?None ?____________________________________________ ? ? ?INITIAL IMPRESSION / ASSESSMENT AND PLAN / ED COURSE ? ?Pertinent labs & imaging results that were available during Jill care of the patient were reviewed by me and considered in Jill medical decision making (see chart for details). ?  ?This patient is Presenting for Evaluation of sore throat, which does require a range of treatment options, and is a complaint that involves a high risk of morbidity and mortality. ? ?The Differential Diagnoses include viral pharyngitis, strep pharyngitis, PTA, retropharyngeal abscess, mono. ? ?Critical Interventions-  ?  ?Medications  ?ibuprofen (ADVIL) tablet 600 mg (600 mg Oral Given 10/14/21 2033)  ?dexamethasone (DECADRON) injection 10 mg (10 mg Intramuscular Given 10/14/21 2208)  ? ? ?Reassessment after intervention: No worsening symptoms.  ? ?Clinical Laboratory Tests Ordered, included Strep PCR, Mono, and Viral PCR panel all negative.  ? ? ?Medical Decision Making: Summary:  ?Patient with sore throat. Strep test negative  here. Mono negative. No PTA on exam. No hard signs to suspect deep space neck infection. Plan for decadron and supportive care at home.  ? ?Reevaluation with update and discussion with patient. Discussed mgmt plan and ED return precautions.  ? ?Disposition:  discharge ? ?____________________________________________ ? ?FINAL CLINICAL IMPRESSION(S) / ED DIAGNOSES ? ?Final diagnoses:  ?Viral pharyngitis  ? ?Note:  This document was prepared using Dragon voice recognition software and may include unintentional dictation errors. ? ?Alona Bene, MD, FACEP ?Emergency Medicine ? ?  ?Maia Plan, MD ?10/18/21 [phone] ? ?

## 2021-10-14 NOTE — ED Triage Notes (Signed)
Pt c/o continued sore throat since Sunday. Pt was seen UC Tuesday and had negative testing for strep. ?

## 2023-01-04 ENCOUNTER — Ambulatory Visit (HOSPITAL_COMMUNITY)
Admission: EM | Admit: 2023-01-04 | Discharge: 2023-01-04 | Disposition: A | Discharge status: Home / Self Care | Payer: Medicaid Other | Attending: Family Medicine | Admitting: Family Medicine

## 2023-01-04 ENCOUNTER — Encounter (HOSPITAL_COMMUNITY): Payer: Self-pay

## 2023-01-04 DIAGNOSIS — Z3201 Encounter for pregnancy test, result positive: Secondary | ICD-10-CM

## 2023-01-04 DIAGNOSIS — R1030 Lower abdominal pain, unspecified: Secondary | ICD-10-CM

## 2023-01-04 LAB — POCT URINALYSIS DIP (MANUAL ENTRY)
Bilirubin, UA: NEGATIVE
Blood, UA: NEGATIVE
Glucose, UA: NEGATIVE mg/dL
Ketones, POC UA: NEGATIVE mg/dL
Leukocytes, UA: NEGATIVE
Nitrite, UA: NEGATIVE
Protein Ur, POC: NEGATIVE mg/dL
Spec Grav, UA: 1.02 (ref 1.010–1.025)
Urobilinogen, UA: 0.2 E.U./dL
pH, UA: 6 (ref 5.0–8.0)

## 2023-01-04 LAB — POCT URINE PREGNANCY: Preg Test, Ur: POSITIVE — AB

## 2023-01-04 NOTE — ED Provider Notes (Addendum)
[facility]   [id number] 01/04/23 Arrival Time: 4098  ASSESSMENT & PLAN:  1. Lower abdominal pain   2. Pregnancy test positive    Results for orders placed or performed during the hospital encounter of 01/04/23  POCT urine pregnancy  Result Value Ref Range   Preg Test, Ur Positive (A) Negative  POCT urinalysis dipstick  Result Value Ref Range   Color, UA yellow yellow   Clarity, UA clear clear   Glucose, UA negative negative mg/dL   Bilirubin, UA negative negative   Ketones, POC UA negative negative mg/dL   Spec Grav, UA 1.191 4.782 - 1.025   Blood, UA negative negative   pH, UA 6.0 5.0 - 8.0   Protein Ur, POC negative negative mg/dL   Urobilinogen, UA 0.2 0.2 or 1.0 E.U./dL   Nitrite, UA Negative Negative   Leukocytes, UA Negative Negative    OB History     Gravida  2   Para  2   Term  1   Preterm  1   AB  0   Living  2      SAB  0   IAB  0   Ectopic  0   Multiple  0   Live Births  2          Mild lower abdominal pain on palpation. Cannot r/o ectopic. To [facility]U by POV for further evaluation.   Follow-up Information     Go to  [facility] ([facility]).   Contact information: [address] [place],  [place]  [address]               Reviewed expectations re: course of current medical issues. Questions answered. Outlined signs and symptoms indicating need for more acute intervention. Patient verbalized understanding. After Visit Summary given.   SUBJECTIVE: History from: patient. Jill Reed is a 32 y.o. female who presents with complaint of lower abdominal discomfort; few days. Described as dull without radiation. Normal bowel/bladder habits. Denies fever/n/v/d.  Patient's last menstrual period was 11/29/2022 (approximate).  Past Surgical History:  Procedure Laterality Date   NO PAST SURGERIES     WOUND EXPLORATION N/A 05/18/2020   Procedure: RIGHT WRIST EXPLORATION WITH  TENDON, ARTERY AND NERVE REPAIR;  Surgeon: Betha Loa, MD;  Location: [facility] OR;  Service: Orthopedics;  Laterality: N/A;     OBJECTIVE:  Vitals:   01/04/23 0953 01/04/23 0954  BP:  118/75  Pulse:  70  Resp:  18  Temp:  99 F (37.2 C)  TempSrc:  Oral  SpO2:  98%  Weight: 79.4 kg   Height: 5\' 2"  (1.575 m)     General appearance: alert, oriented, no acute distress HEENT: [place]; AT; oropharynx moist Lungs: unlabored respirations Abdomen: soft; without distention; mild  and poorly localized tenderness to palpation over over lower abdomen ; normal bowel sounds; without masses or organomegaly; without guarding or rebound tenderness Back: without reported CVA tenderness; FROM at waist Extremities: without LE edema; symmetrical; without gross deformities Skin: warm and dry Neurologic: normal gait Psychological: alert and cooperative; normal mood and affect  Labs: Results for orders placed or performed during the hospital encounter of 01/04/23  POCT urine pregnancy  Result Value Ref Range   Preg Test, Ur Positive (A) Negative  POCT urinalysis dipstick  Result Value Ref Range   Color, UA yellow yellow   Clarity, UA clear clear   Glucose, UA negative negative mg/dL   Bilirubin, UA  negative negative   Ketones, POC UA negative negative mg/dL   Spec Grav, UA 1.610 9.604 - 1.025   Blood, UA negative negative   pH, UA 6.0 5.0 - 8.0   Protein Ur, POC negative negative mg/dL   Urobilinogen, UA 0.2 0.2 or 1.0 E.U./dL   Nitrite, UA Negative Negative   Leukocytes, UA Negative Negative   Labs Reviewed  POCT URINE PREGNANCY - Abnormal; Notable for the following components:      Result Value   Preg Test, Ur Positive (*)    All other components within normal limits  POCT URINALYSIS DIP (MANUAL ENTRY)    Imaging: No results found.   Allergies  Allergen Reactions   Carrot [Daucus Carota] Itching and Rash   Mushroom Extract Complex Itching and Rash   Peach [Prunus Persica] Itching and  Rash                                               Past Medical History:  Diagnosis Date   Anxiety    Asthma    Atypical chest pain 09/23/2019   Atypical chest pain 09/23/2019   Depression    Irregular heart beat    Shortness of breath 09/23/2019   Shortness of breath 09/23/2019   Sickle cell trait (HCC)    Tobacco abuse 09/23/2019    Social History   Socioeconomic History   Marital status: Single    Spouse name: Not on file   Number of children: 1   Years of education: Not on file   Highest education level: Not on file  Occupational History   Occupation: unemployed  Tobacco Use   Smoking status: Some Days    Types: Cigars   Smokeless tobacco: Never   Tobacco comments:    black n milds-last two weeks ago  Vaping Use   Vaping Use: Never used  Substance and Sexual Activity   Alcohol use: No   Drug use: No   Sexual activity: Yes    Birth control/protection: None  Other Topics Concern   Not on file  Social History Narrative   Not on file   Social Determinants of Health   Financial Resource Strain: Not on file  Food Insecurity: Not on file  Transportation Needs: Not on file  Physical Activity: Not on file  Stress: Not on file  Social Connections: Not on file  Intimate Partner Violence: Not on file    Family History  Problem Relation Age of Onset   Sickle cell anemia Father    Breast cancer Maternal Aunt        pt doesn't know age of diagnosis   Hypertension Mother    Arthritis Mother    ADD / ADHD Paternal Uncle    Arthritis Maternal Grandmother    Hypertension Maternal Grandmother    Diabetes Paternal Grandmother    Stroke Paternal Dulce Sellar, MD 01/04/23 1022    Mardella Layman, MD 01/04/23 1023

## 2023-01-04 NOTE — ED Triage Notes (Signed)
Abdominal pain onset Monday night after eating. Thought it was period cramps but the cramping has not eased up. No diarrhea, constipation, or emesis.   Patient requesting a pregnancy test. Condom broke Monday night.

## 2023-01-04 NOTE — ED Notes (Signed)
Patient is being discharged from the Urgent Care and sent to the Emergency Department via private vehicle . Per Dr Tracie Harrier, patient is in need of higher level of care due to abdominal pain . Patient is aware and verbalizes understanding of plan of care.  Vitals:   01/04/23 0954  BP: 118/75  Pulse: 70  Resp: 18  Temp: 99 F (37.2 C)  SpO2: 98%

## 2023-01-08 ENCOUNTER — Telehealth (HOSPITAL_COMMUNITY): Payer: Self-pay | Admitting: Emergency Medicine

## 2023-01-08 NOTE — Telephone Encounter (Signed)
Received message from front desk that patient was looking for cytology results from her visit with [place].  It appears patient was found to be pregnant with lower abdominal pain and was sent to the ED for further evaluation.  No additional tests were ran. Attempted to reach patient x 1, person who answered states she is unavailable.  If she did not follow up in the ED, she will need to proceed to them for follow up at this time.

## 2023-01-08 NOTE — Telephone Encounter (Signed)
Was able to reach patient, explained situation, patient did express her frustration and states she was not under the impression she was being sent to the ER.  She verbalized understanding that testing will not be completed and f/u options
